# Patient Record
Sex: Male | Born: 1955
Health system: Southern US, Community
[De-identification: ages and names within clinical notes are randomized; demographics above are authoritative.]

## PROBLEM LIST (undated history)

## (undated) DIAGNOSIS — F419 Anxiety disorder, unspecified: Secondary | ICD-10-CM

## (undated) DIAGNOSIS — F329 Major depressive disorder, single episode, unspecified: Secondary | ICD-10-CM

## (undated) DIAGNOSIS — I340 Nonrheumatic mitral (valve) insufficiency: Secondary | ICD-10-CM

## (undated) DIAGNOSIS — G4700 Insomnia, unspecified: Secondary | ICD-10-CM

## (undated) DIAGNOSIS — I428 Other cardiomyopathies: Secondary | ICD-10-CM

## (undated) DIAGNOSIS — I82431 Acute embolism and thrombosis of right popliteal vein: Secondary | ICD-10-CM

## (undated) DIAGNOSIS — I2510 Atherosclerotic heart disease of native coronary artery without angina pectoris: Secondary | ICD-10-CM

## (undated) DIAGNOSIS — J45909 Unspecified asthma, uncomplicated: Secondary | ICD-10-CM

## (undated) DIAGNOSIS — I2699 Other pulmonary embolism without acute cor pulmonale: Secondary | ICD-10-CM

## (undated) DIAGNOSIS — I4729 Other ventricular tachycardia: Secondary | ICD-10-CM

## (undated) DIAGNOSIS — I513 Intracardiac thrombosis, not elsewhere classified: Secondary | ICD-10-CM

## (undated) DIAGNOSIS — I472 Ventricular tachycardia: Secondary | ICD-10-CM

## (undated) DIAGNOSIS — G2581 Restless legs syndrome: Secondary | ICD-10-CM

## (undated) DIAGNOSIS — I5042 Chronic combined systolic (congestive) and diastolic (congestive) heart failure: Secondary | ICD-10-CM

## (undated) DIAGNOSIS — F32A Depression, unspecified: Secondary | ICD-10-CM

## (undated) HISTORY — PX: TONSILLECTOMY: SUR1361

## (undated) HISTORY — PX: MASTOIDECTOMY: SHX711

---

## 2006-09-19 ENCOUNTER — Ambulatory Visit: Payer: Self-pay | Admitting: Internal Medicine

## 2006-09-19 LAB — CONVERTED CEMR LAB: Testosterone, total: 4.2936 ng/mL

## 2006-10-26 ENCOUNTER — Ambulatory Visit: Payer: Self-pay | Admitting: Internal Medicine

## 2007-05-11 DIAGNOSIS — J4540 Moderate persistent asthma, uncomplicated: Secondary | ICD-10-CM | POA: Insufficient documentation

## 2007-05-11 DIAGNOSIS — F329 Major depressive disorder, single episode, unspecified: Secondary | ICD-10-CM | POA: Insufficient documentation

## 2007-06-30 ENCOUNTER — Ambulatory Visit: Payer: Self-pay | Admitting: Internal Medicine

## 2007-06-30 DIAGNOSIS — F411 Generalized anxiety disorder: Secondary | ICD-10-CM | POA: Insufficient documentation

## 2008-02-22 ENCOUNTER — Emergency Department (HOSPITAL_COMMUNITY): Admission: EM | Admit: 2008-02-22 | Discharge: 2008-02-22 | Payer: Self-pay | Admitting: Emergency Medicine

## 2008-04-18 ENCOUNTER — Ambulatory Visit: Payer: Self-pay | Admitting: Internal Medicine

## 2008-04-18 LAB — CONVERTED CEMR LAB
AST: 16 units/L (ref 0–37)
Albumin: 4.9 g/dL (ref 3.5–5.2)
Alkaline Phosphatase: 39 units/L (ref 39–117)
BUN: 15 mg/dL (ref 6–23)
Calcium: 9.5 mg/dL (ref 8.4–10.5)
Chloride: 102 meq/L (ref 96–112)
Glucose, Bld: 90 mg/dL (ref 70–99)
LDL Cholesterol: 122 mg/dL — ABNORMAL HIGH (ref 0–99)
Potassium: 4.5 meq/L (ref 3.5–5.3)
Sodium: 137 meq/L (ref 135–145)
TIBC: 286 ug/dL (ref 215–435)
TSH: 1.64 microintl units/mL (ref 0.350–4.50)
Total Protein: 7.6 g/dL (ref 6.0–8.3)
Triglycerides: 158 mg/dL — ABNORMAL HIGH (ref <150)

## 2008-05-09 ENCOUNTER — Ambulatory Visit: Payer: Self-pay | Admitting: Internal Medicine

## 2008-06-03 ENCOUNTER — Ambulatory Visit: Payer: Self-pay | Admitting: Internal Medicine

## 2008-06-06 ENCOUNTER — Ambulatory Visit: Payer: Self-pay | Admitting: Internal Medicine

## 2008-06-25 ENCOUNTER — Ambulatory Visit: Payer: Self-pay | Admitting: Internal Medicine

## 2008-07-15 ENCOUNTER — Ambulatory Visit: Payer: Self-pay | Admitting: Internal Medicine

## 2008-08-05 ENCOUNTER — Ambulatory Visit: Payer: Self-pay | Admitting: Internal Medicine

## 2008-10-25 ENCOUNTER — Ambulatory Visit: Payer: Self-pay | Admitting: Internal Medicine

## 2011-09-24 ENCOUNTER — Ambulatory Visit: Payer: Managed Care, Other (non HMO)

## 2011-09-24 DIAGNOSIS — R21 Rash and other nonspecific skin eruption: Secondary | ICD-10-CM

## 2011-09-24 DIAGNOSIS — J45909 Unspecified asthma, uncomplicated: Secondary | ICD-10-CM

## 2012-03-07 ENCOUNTER — Encounter: Payer: Managed Care, Other (non HMO) | Admitting: Internal Medicine

## 2012-03-10 NOTE — Progress Notes (Signed)
This encounter was created in error - please disregard.

## 2014-03-14 ENCOUNTER — Ambulatory Visit: Payer: BC Managed Care – PPO | Admitting: Podiatry

## 2014-03-25 ENCOUNTER — Ambulatory Visit (HOSPITAL_BASED_OUTPATIENT_CLINIC_OR_DEPARTMENT_OTHER): Payer: BC Managed Care – PPO | Attending: Internal Medicine | Admitting: Radiology

## 2014-03-25 VITALS — Ht 68.0 in | Wt 168.0 lb

## 2014-03-25 DIAGNOSIS — R0609 Other forms of dyspnea: Secondary | ICD-10-CM | POA: Insufficient documentation

## 2014-03-25 DIAGNOSIS — G4730 Sleep apnea, unspecified: Principal | ICD-10-CM

## 2014-03-25 DIAGNOSIS — G4719 Other hypersomnia: Secondary | ICD-10-CM

## 2014-03-25 DIAGNOSIS — R0989 Other specified symptoms and signs involving the circulatory and respiratory systems: Secondary | ICD-10-CM | POA: Insufficient documentation

## 2014-03-25 DIAGNOSIS — R5381 Other malaise: Secondary | ICD-10-CM

## 2014-03-25 DIAGNOSIS — G4761 Periodic limb movement disorder: Secondary | ICD-10-CM | POA: Insufficient documentation

## 2014-03-25 DIAGNOSIS — R0683 Snoring: Secondary | ICD-10-CM

## 2014-03-25 DIAGNOSIS — I4949 Other premature depolarization: Secondary | ICD-10-CM | POA: Insufficient documentation

## 2014-03-25 DIAGNOSIS — R5383 Other fatigue: Secondary | ICD-10-CM

## 2014-03-25 DIAGNOSIS — G4710 Hypersomnia, unspecified: Secondary | ICD-10-CM | POA: Insufficient documentation

## 2014-03-30 DIAGNOSIS — G4710 Hypersomnia, unspecified: Secondary | ICD-10-CM

## 2014-03-30 NOTE — Sleep Study (Signed)
    NAME: Marcus Bates DATE OF BIRTH:  June 17, 1956 MEDICAL RECORD NUMBER [id number]  LOCATION: [facility]  PHYSICIAN: YOUNG,CLINTON D  DATE OF STUDY: 03/25/2014  SLEEP STUDY TYPE: Multiple Sleep Latency Test               REFERRING PHYSICIAN: Darnelle Bos,*  INDICATION FOR STUDY: Hypersomnia question narcolepsy  EPWORTH SLEEPINESS SCORE:   12/24 HEIGHT:   68 inches WEIGHT:   168 pounds BMI 26 NECK SIZE:   14 inches   MEDICATIONS: Charted for review  NAP 1: 14:30 p.m. sleep latency 2 minutes, REM latency N/A  NAP 2: 16:30 p.m. sleep latency 3.5 minutes, REM latency N/A  NAP 3: 18:30 p.m. sleep latency 1.5 minutes, REM Latency N/A  NAP 4: 20:30 p.m. sleep latency 5 minutes, REM latency N/A  NAP 5: 22:30 p.m. sleep latency 20 minutes, REM latency N/A  MEAN SLEEP LATENCY: 6.4 minutes  NUMBER OF REM EPISODES:  0/5 naps  COMMENTS:  The study was performed following a standard polysomnogram which had been recorded between 6:23 AM and 12:39 PM to accommodate the patient's usual sleep schedule. No medications were taken for either study. The polysomnogram recorded 253.5 minutes sleep, 19.3% REM with REM latency 194.5 minutes. AHI 0.9 per hour with loud snoring, no movement disturbance.  IMPRESSION/ RECOMMENDATION:   1) Abnormal daytime hypersomnia following polysomnogram. Mean sleep latency of 6.4 minutes with sleep on all naps would not be expected in a normal person under these circumstances. This is not specific for narcolepsy given the lack of REM pressure demonstrated during the polysomnogram and the absence of REM on all 5 naps for the study. Depending on clinical information, Idiopathic Hypersomnia would be consistent with these results.   Signed Jetty Duhamel M.D. Waymon Budge Diplomate, American Board of Sleep Medicine  ELECTRONICALLY SIGNED ON:  03/30/2014, 3:22 PM [facility] PH: [phone]   FX: [phone]  [phone] ACCREDITED BY THE AMERICAN ACADEMY OF SLEEP MEDICINE

## 2014-03-30 NOTE — Sleep Study (Signed)
   NAME: Marcus Bates DATE OF BIRTH:  [birth year] MEDICAL RECORD NUMBER [id number]  LOCATION: [facility]  PHYSICIAN: YOUNG,CLINTON D  DATE OF STUDY: 03/25/2014  SLEEP STUDY TYPE:  Polysomnogram- recorded as a daytime study to accommodate the patient's usual sleep schedule.               REFERRING PHYSICIAN: Darnelle Bos,*  INDICATION FOR STUDY: Hypersomnia with sleep apnea  EPWORTH SLEEPINESS SCORE:   12/24 HEIGHT: 5\' 8"  (172.7 cm)  WEIGHT: 76.204 kg (168 lb)    Body mass index is 25.55 kg/(m^2).  NECK SIZE: 14 in.  MEDICATIONS: Charted for review  SLEEP ARCHITECTURE: Total sleep time 253.5 minutes with sleep efficiency 67.3%. Stage I was 13.2%, stage II 67.5%, stage III absent, REM 19.3% of total sleep time. Sleep latency 15 minutes, REM latency 194.5 minutes, awake after sleep onset 109.5 minutes, arousal index 29.6, bedtime medication: None listed  RESPIRATORY DATA: Apnea hypopneas index (AHI) 0.9 per hour. 4 total events scored, all  central apneas while nonsupine. REM AHI 3.7 per hour.  OXYGEN DATA: Loud snoring with oxygen desaturation to a nadir of 90% and mean saturation of 95.6% on room air.  CARDIAC DATA: Sinus rhythm with PVCs  MOVEMENT/PARASOMNIA: A total of 44 limb jerks were counted of which 8 were associated with arousals or awakenings for a periodic limb movement with arousal index of 1.9 per hour. Bathroom x1  IMPRESSION/ RECOMMENDATION:   1) This study was recorded as a daytime study with lights out at 6:23 AM and lights on at 12:39 PM. Sleep architecture was significant for several episodes of wakefulness. Sustained sleep was not maintained until 7:30 AM. He was awake spontaneously 8-8:30 AM and 945-10:30 AM. 2) Occasional respiratory event with sleep disturbance, within normal limits. 4 central apneas were recorded. Snoring was very loud. Oxygen desaturation to a nadir of 90% and mean saturation through the study of 95.6% on room  air. 3) See report of multiple sleep latency test following this study.  Signed Jetty Duhamel M.D. Waymon Budge Diplomate, American Board of Sleep Medicine  ELECTRONICALLY SIGNED ON:  03/30/2014, 3:07 PM [facility] PH: [phone]   FX: [phone] ACCREDITED BY THE AMERICAN ACADEMY OF SLEEP MEDICINE

## 2014-03-31 DIAGNOSIS — G4710 Hypersomnia, unspecified: Secondary | ICD-10-CM

## 2014-04-01 ENCOUNTER — Ambulatory Visit (INDEPENDENT_AMBULATORY_CARE_PROVIDER_SITE_OTHER): Payer: BC Managed Care – PPO | Admitting: Podiatry

## 2014-04-01 ENCOUNTER — Encounter: Payer: Self-pay | Admitting: Podiatry

## 2014-04-01 VITALS — BP 129/87 | HR 93 | Resp 16 | Ht 68.0 in | Wt 168.0 lb

## 2014-04-01 DIAGNOSIS — L600 Ingrowing nail: Secondary | ICD-10-CM

## 2014-04-01 NOTE — Progress Notes (Signed)
Subjective:     Patient ID: Marcus Bates, male   DOB: [birth year], 58 y.o.   MRN: [id number]  HPI patient presents with deformity in the second nails of both feet of approximate two-year duration. Does not remember specific injuries but they were involved in a fungal issue about 2-1/2 years ago   Review of Systems  All other systems reviewed and are negative.      Objective:   Physical Exam  Nursing note and vitals reviewed. Constitutional: He is oriented to person, place, and time.  Cardiovascular: Intact distal pulses.   Musculoskeletal: Normal range of motion.  Neurological: He is oriented to person, place, and time.  Skin: Skin is warm.   neurovascular status intact with muscle strength adequate range of motion subtalar joint within normal limits. Patient's found to have loose second nails of both feet with the right one having fallen off several weeks ago in the left one discolored and ready to fall off at this time. Digits are well-perfused     Assessment:     Probable damage the second nails both feet with very thin nailbeds and inability to hold well    Plan:     Reviewed condition and recommended that either the nails be removed permanently or will allow him to continue to do what they are doing. After thinking he decided to just let him go and see how they do

## 2014-04-01 NOTE — Progress Notes (Signed)
   Subjective:    Patient ID: Marcus Bates, male    DOB: September 29, 1955, 58 y.o.   MRN: [id number]  HPI Comments: "I have a problem with two toes"  Patient c/o discolored, thick, brittle 2nd toenails bilateral for about 2 years. He states that they go through a cycle where they get blood underneath the toenail and they turn dark and fall off. He has been using OTC meds-no help.     Review of Systems  Constitutional: Positive for fatigue.  HENT: Positive for hearing loss.   Eyes: Positive for visual disturbance.  Respiratory: Positive for shortness of breath.   Genitourinary: Positive for frequency.  Musculoskeletal: Positive for arthralgias.  Allergic/Immunologic: Positive for environmental allergies.  All other systems reviewed and are negative.      Objective:   Physical Exam        Assessment & Plan:

## 2014-08-12 ENCOUNTER — Ambulatory Visit (INDEPENDENT_AMBULATORY_CARE_PROVIDER_SITE_OTHER): Payer: BC Managed Care – PPO | Admitting: Internal Medicine

## 2014-08-12 ENCOUNTER — Encounter: Payer: Self-pay | Admitting: Internal Medicine

## 2014-08-12 VITALS — BP 138/72 | HR 95 | Ht 68.0 in | Wt 169.0 lb

## 2014-08-12 DIAGNOSIS — G4720 Circadian rhythm sleep disorder, unspecified type: Secondary | ICD-10-CM

## 2014-08-12 MED ORDER — CLONAZEPAM 0.5 MG PO TABS: ORAL_TABLET | ORAL | Status: DC

## 2014-08-12 MED ORDER — METHYLPHENIDATE HCL ER 20 MG PO TBCR
20.0000 mg | EXTENDED_RELEASE_TABLET | Freq: Every day | ORAL | Status: DC
Start: 2014-08-12 — End: 2015-05-16

## 2014-08-12 NOTE — Progress Notes (Signed)
08/12/14- 58 yoM never smoker self referred for second sleep medicine opinion.  He has been working with Dr Earl Gala. Mainly complains of not sleeping well and excessive sleepiness. 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 Usual bedtime 6AM, up at 1:30 PM. Long sleep latency, but says usual concern is waking during sleep and carry-over tired/ groggy. Aware of leg jerks and has tried Requip and Mirapex, but not clear jerks or restless legs are important.  Has dark, quiet bedroom Presents long list of previous prescription and nonprescription meds and herbal products tried. Recently using gabapentin to suppress leg jerks and promote sleep. It works well but adds to residual sleepiness. Clonazepam up to 2 mg did not work as well by itself, but worked better combined w gabapentin. Some snore. Admits mild depression. Denies ETOH, street drugs. Denies cardiac disease or neurologic problem. NPSG 04/14/98-AHI 0/ hr, Periodic limb movement, mild snoring PSG 03/25/14- Daytime study- AHI 0.9/ hr, mild limb jerks, fragmented sleep with frequent awakening, total sleep time 253.5 minutes, 19.3% REM. MSLT 03/26/14- Done after daytime PSG, mean latency 6.4 min, no SOREM/ 5 naps. Pattern consistent with nonspecific hypersomnia with studies timed to be similar to his usual sleep-wake pattern.  Prior to Admission medications   Medication Sig Start Date End Date Taking? Authorizing Provider  BUPROPION HCL PO Take by mouth.   Yes Historical Provider, MD  Fluticasone-Salmeterol (ADVAIR) 100-50 MCG/DOSE AEPB Inhale 1 puff into the lungs every 12 (twelve) hours.   Yes Historical Provider, MD  gabapentin (NEURONTIN) 600 MG tablet Take 300 mg by mouth at bedtime.    Yes Historical Provider, MD  clonazePAM (KLONOPIN) 0.5 MG tablet 1 for sleep as directed 08/12/14   Waymon Budge, MD  methylphenidate (METADATE ER) 20 MG ER tablet Take 1 tablet (20 mg total) by mouth daily. 08/12/14   Waymon Budge,  MD   No past medical history on file. No past surgical history on file. No family history on file. History   Social History  . Marital Status: Single    Spouse Name: N/A    Number of Children: N/A  . Years of Education: N/A   Occupational History  . Not on file.   Social History Main Topics  . Smoking status: Never Smoker   . Smokeless tobacco: Never Used  . Alcohol Use: Not on file  . Drug Use: Not on file  . Sexual Activity: Not on file   Other Topics Concern  . Not on file   Social History Narrative   ROS-see HPI Constitutional:   No-   weight loss, night sweats, fevers, chills, fatigue, lassitude. HEENT:   No-  headaches, difficulty swallowing, tooth/dental problems, sore throat,       No-  sneezing, itching, ear ache, nasal congestion, post nasal drip,  CV:  No-   chest pain, orthopnea, PND, swelling in lower extremities, anasarca,                                  dizziness, palpitations Resp: No-   shortness of breath with exertion or at rest.              No-   productive cough,  No non-productive cough,  No- coughing up of blood.              No-   change in color of mucus.  No- wheezing.  Skin: No-   rash or lesions. GI:  No-   heartburn, indigestion, abdominal pain, nausea, vomiting, diarrhea,                 change in bowel habits, loss of appetite GU: No-   dysuria, change in color of urine, no urgency or frequency.  No- flank pain. MS:  No-   joint pain or swelling.  No- decreased range of motion.  No- back pain. Neuro-     nothing unusual Psych:  No- change in mood or affect. No depression or anxiety.  No memory loss.  OBJ- Physical Exam General- Alert, Oriented, Affect-appropriate, Distress- none acute Skin- rash-none, lesions- none, excoriation- none Lymphadenopathy- none Head- atraumatic            Eyes- Gross vision intact, PERRLA, conjunctivae and secretions clear            Ears- Hearing, canals-normal            Nose- Clear, no-Septal dev,  mucus, polyps, erosion, perforation             Throat- Mallampati II , mucosa clear , drainage- none, tonsils- atrophic Neck- flexible , trachea midline, no stridor , thyroid nl, carotid no bruit Chest - symmetrical excursion , unlabored           Heart/CV- RRR , no murmur , no gallop  , no rub, nl s1 s2                           - JVD- none , edema- none, stasis changes- none, varices- none           Lung- clear to P&A, wheeze- none, cough- none , dullness-none, rub- none           Chest wall-  Abd- tender-no, distended-no, bowel sounds-present, HSM- no Br/ Gen/ Rectal- Not done, not indicated Extrem- cyanosis- none, clubbing, none, atrophy- none, strength- nl Neuro- grossly intact to observation

## 2014-08-12 NOTE — Patient Instructions (Addendum)
Script for methylphenidate ER 20 mg (Ritalin) to take on first waking. See if this helps with daytime drowsiness without over-stimulating you.   Script for clonazepam(Klonopin) 0.5 mg. Try combining this with your gabapentin 300 mg and take them both earlier- perhaps 2:30 AM, so they wear off with less drowsiness when you want to be awake. You can split either or both to take lower doses if that works better.   We don't want you being treated by both Dr Earl Gala and myself for the same problem. Since you started with him, my suggestion would be that you try these meds as discussed today, then follow up on your experience with Dr Earl Gala going forward. I will send him a copy of my note.  Cognitive Behavioral Therapy may be a very good alternative to lots of medication.

## 2014-08-25 DIAGNOSIS — G4720 Circadian rhythm sleep disorder, unspecified type: Secondary | ICD-10-CM | POA: Insufficient documentation

## 2014-08-25 NOTE — Assessment & Plan Note (Signed)
Describes gabapentin treated for ? Hypnic jerks. This doesn't sound like periodic limb movement, restless legs or another diagnosable movement disorder. Suspect there may be a component of depression. Sleep studies do not demonstrate today diagnosable sleep disordered breathing pattern. There may be a component of chronic insomnia. He was quick to present a long list of sleep medicines and natural remedies which he has tried. He did best with clonazepam 0.5 mg plus gabapentin 300 mg. We discussed a limited trial of methylphenidate ER to help anchor and earlier daytime waking. Cognitive behavioral therapy may be useful for him.

## 2014-11-26 ENCOUNTER — Other Ambulatory Visit: Payer: Self-pay | Admitting: Internal Medicine

## 2014-11-28 ENCOUNTER — Other Ambulatory Visit: Payer: Self-pay | Admitting: *Deleted

## 2014-11-28 NOTE — Telephone Encounter (Signed)
Faxed 11/28/14

## 2014-11-28 NOTE — Telephone Encounter (Signed)
Rx printed

## 2015-03-29 ENCOUNTER — Emergency Department (INDEPENDENT_AMBULATORY_CARE_PROVIDER_SITE_OTHER)
Admission: EM | Admit: 2015-03-29 | Discharge: 2015-03-29 | Disposition: A | Discharge status: Home / Self Care | Payer: BLUE CROSS/BLUE SHIELD | Source: Home / Self Care

## 2015-03-29 ENCOUNTER — Encounter (HOSPITAL_COMMUNITY): Payer: Self-pay | Admitting: Emergency Medicine

## 2015-03-29 DIAGNOSIS — J45901 Unspecified asthma with (acute) exacerbation: Secondary | ICD-10-CM

## 2015-03-29 DIAGNOSIS — L739 Follicular disorder, unspecified: Secondary | ICD-10-CM

## 2015-03-29 HISTORY — DX: Insomnia, unspecified: G47.00

## 2015-03-29 HISTORY — DX: Anxiety disorder, unspecified: F41.9

## 2015-03-29 HISTORY — DX: Unspecified asthma, uncomplicated: J45.909

## 2015-03-29 HISTORY — DX: Depression, unspecified: F32.A

## 2015-03-29 HISTORY — DX: Major depressive disorder, single episode, unspecified: F32.9

## 2015-03-29 MED ORDER — MUPIROCIN 2 % EX OINT: 1.0000 "application " | TOPICAL_OINTMENT | Freq: Two times a day (BID) | CUTANEOUS | Status: DC

## 2015-03-29 MED ORDER — IPRATROPIUM-ALBUTEROL 0.5-2.5 (3) MG/3ML IN SOLN
RESPIRATORY_TRACT | Status: AC
  Filled 2015-03-29: qty 3

## 2015-03-29 MED ORDER — METHYLPREDNISOLONE SODIUM SUCC 125 MG IJ SOLR
INTRAMUSCULAR | Status: AC
  Filled 2015-03-29: qty 2

## 2015-03-29 MED ORDER — METHYLPREDNISOLONE SODIUM SUCC 125 MG IJ SOLR
62.5000 mg | Freq: Once | INTRAMUSCULAR | Status: AC
  Administered 2015-03-29: 62.5 mg via INTRAMUSCULAR

## 2015-03-29 MED ORDER — IPRATROPIUM-ALBUTEROL 0.5-2.5 (3) MG/3ML IN SOLN
3.0000 mL | Freq: Once | RESPIRATORY_TRACT | Status: AC
  Administered 2015-03-29: 3 mL via RESPIRATORY_TRACT

## 2015-03-29 MED ORDER — ALBUTEROL SULFATE HFA 108 (90 BASE) MCG/ACT IN AERS: 2.0000 | INHALATION_SPRAY | RESPIRATORY_TRACT | Status: AC | PRN

## 2015-03-29 MED ORDER — PREDNISONE 50 MG PO TABS: ORAL_TABLET | ORAL | Status: DC

## 2015-03-29 NOTE — ED Notes (Signed)
C/o asthma States he has chest tightness States he is sob with execration advair used as tx

## 2015-03-29 NOTE — ED Provider Notes (Addendum)
CSN: [id number]     Arrival date & time 03/29/15  1745 History   None    Chief Complaint  Patient presents with  . Asthma   (Consider location/radiation/quality/duration/timing/severity/associated sxs/prior Treatment) HPI  Shortness of breath. Started several days ago. Associated with intermittent wheezing, chest tightness, occasional diaphoresis. Patient routinely takes Advair at night which typically helps but is not help recently. Symptoms significantly worse with exertion. States that the chest tightness or fullness that he has had in the midsternal region is different than his normal chest tightness which is associated with his asthma. Denies any palpitations, radiation to the neck or shoulder, fevers, vomiting, diarrhea, constipation, abdominal pain. Occasional nausea. Patient currently is without chest tightness but states that this comes on fairly easily as he exerts himself.  She had a swollen right lower extremity back in January which spontaneously resolved as it no further limb swelling since that point time.  Head source. Ongoing for months. Washes the area daily without benefit. Continues to progress.  Past Medical History  Diagnosis Date  . Asthma   . Anxiety   . Depression   . Insomnia    History reviewed. No pertinent past surgical history. Family History  Problem Relation Age of Onset  . Heart disease Other    History  Substance Use Topics  . Smoking status: Never Smoker   . Smokeless tobacco: Never Used  . Alcohol Use: Not on file    Review of Systems Per HPI with all other pertinent systems negative.    Allergies  Aspirin; Dust mite extract; Other; and Peanuts  Home Medications   Prior to Admission medications   Medication Sig Start Date End Date Taking? Authorizing Provider  Amphetamine Sulfate 5 MG TABS Take by mouth.   Yes Historical Provider, MD  BUPROPION HCL PO Take by mouth.   Yes Historical Provider, MD  clonazePAM (KLONOPIN) 0.5 MG tablet  TAKE 1 TABLET BY MOUTH EVERY EVENING AS DIRECTED 11/28/14  Yes Waymon Budge, MD  Fluticasone-Salmeterol (ADVAIR) 100-50 MCG/DOSE AEPB Inhale 1 puff into the lungs every 12 (twelve) hours.   Yes Historical Provider, MD  gabapentin (NEURONTIN) 600 MG tablet Take 300 mg by mouth at bedtime.    Yes Historical Provider, MD  methylphenidate (METADATE ER) 20 MG ER tablet Take 1 tablet (20 mg total) by mouth daily. 08/12/14  Yes Waymon Budge, MD  albuterol (PROVENTIL HFA;VENTOLIN HFA) 108 (90 BASE) MCG/ACT inhaler Inhale 2 puffs into the lungs every 4 (four) hours as needed for wheezing or shortness of breath. 03/29/15   Ozella Rocks, MD  mupirocin ointment (BACTROBAN) 2 % Apply 1 application topically 2 (two) times daily. 03/29/15   Ozella Rocks, MD  predniSONE (DELTASONE) 50 MG tablet Take daily with breakfast 03/29/15   Ozella Rocks, MD   BP 123/86 mmHg  Pulse 108  Temp(Src) 97.1 F (36.2 C) (Oral)  Resp 16  SpO2 96% Physical Exam Physical Exam  Constitutional: oriented to person, place, and time. appears well-developed and well-nourished. No distress.  HENT:  Head: Normocephalic and atraumatic.  Eyes: EOMI. PERRL.  Neck: Normal range of motion.  Cardiovascular: RRR, no m/r/g, 2+ distal pulses,  Pulmonary/Chest: Increased work of breathing. Decreased breath sounds. No appreciable wheezing rhonchi crackles..  Abdominal: Soft. Bowel sounds are normal. NonTTP, no distension.  Musculoskeletal: Normal range of motion. Non ttp, no effusion.  Neurological: alert and oriented to person, place, and time.  Skin: Right occipital scalp with follicular lesions.  Psychiatric:  normal mood and affect. behavior is normal. Judgment and thought content normal.   ED Course  Procedures (including critical care time) Labs Review Labs Reviewed - No data to display  Imaging Review No results found.   MDM   1. Acute asthma flare, unspecified asthma severity   2. Folliculitis    folliculitis on  treatment with mupirocin given.  EKG with PVCs.  Patient given a DuoNeb and Solu-Medrol 62.5 mg IM in clinic with significant improvement in his shortness of breath. Discussed other possibilities of patient's symptoms including PE, MI with patient. Due to patient's marked improvement after breathing treatments will treat appreciable patient as an asthma flare with continued albuterol every 4 hours and prednisone. Patient given very strict return precautions and will go to the emergency room if he does not improve or gets worse for cardiac and PE workup. Patient agrees with this plan.    Ozella Rocks, MD 03/29/15 Serena Croissant  Ozella Rocks, MD 03/29/15 [phone]

## 2015-03-29 NOTE — Discharge Instructions (Signed)
Cause of your symptoms is likely from an asthma flare. Please use the albuterol inhaler every 4 hours for the next 24-48 hours then as needed. Please take the steroids every morning. Please use the mupirocin ointment on the sores on her head. Please go to the emergency room if her symptoms become worse or do not resolve.

## 2015-04-30 ENCOUNTER — Encounter (HOSPITAL_COMMUNITY): Payer: Self-pay | Admitting: *Deleted

## 2015-04-30 ENCOUNTER — Inpatient Hospital Stay (HOSPITAL_COMMUNITY)
Admission: EM | Admit: 2015-04-30 | Discharge: 2015-05-16 | DRG: 286 | Disposition: A | Discharge status: Home Health | Payer: BLUE CROSS/BLUE SHIELD | Attending: Internal Medicine | Admitting: Internal Medicine

## 2015-04-30 ENCOUNTER — Emergency Department (HOSPITAL_COMMUNITY): Payer: BLUE CROSS/BLUE SHIELD

## 2015-04-30 DIAGNOSIS — T50905A Adverse effect of unspecified drugs, medicaments and biological substances, initial encounter: Secondary | ICD-10-CM | POA: Diagnosis not present

## 2015-04-30 DIAGNOSIS — G4710 Hypersomnia, unspecified: Secondary | ICD-10-CM | POA: Diagnosis present

## 2015-04-30 DIAGNOSIS — F419 Anxiety disorder, unspecified: Secondary | ICD-10-CM | POA: Diagnosis present

## 2015-04-30 DIAGNOSIS — I82431 Acute embolism and thrombosis of right popliteal vein: Secondary | ICD-10-CM | POA: Diagnosis present

## 2015-04-30 DIAGNOSIS — I2699 Other pulmonary embolism without acute cor pulmonale: Secondary | ICD-10-CM | POA: Diagnosis present

## 2015-04-30 DIAGNOSIS — I513 Intracardiac thrombosis, not elsewhere classified: Secondary | ICD-10-CM | POA: Diagnosis present

## 2015-04-30 DIAGNOSIS — I420 Dilated cardiomyopathy: Secondary | ICD-10-CM | POA: Diagnosis present

## 2015-04-30 DIAGNOSIS — R740 Nonspecific elevation of levels of transaminase and lactic acid dehydrogenase [LDH]: Secondary | ICD-10-CM | POA: Diagnosis present

## 2015-04-30 DIAGNOSIS — Z79899 Other long term (current) drug therapy: Secondary | ICD-10-CM | POA: Diagnosis not present

## 2015-04-30 DIAGNOSIS — J9601 Acute respiratory failure with hypoxia: Secondary | ICD-10-CM | POA: Diagnosis present

## 2015-04-30 DIAGNOSIS — R0601 Orthopnea: Secondary | ICD-10-CM

## 2015-04-30 DIAGNOSIS — I240 Acute coronary thrombosis not resulting in myocardial infarction: Secondary | ICD-10-CM | POA: Diagnosis present

## 2015-04-30 DIAGNOSIS — Z8249 Family history of ischemic heart disease and other diseases of the circulatory system: Secondary | ICD-10-CM | POA: Diagnosis not present

## 2015-04-30 DIAGNOSIS — Z886 Allergy status to analgesic agent status: Secondary | ICD-10-CM | POA: Diagnosis not present

## 2015-04-30 DIAGNOSIS — I471 Supraventricular tachycardia: Secondary | ICD-10-CM | POA: Diagnosis present

## 2015-04-30 DIAGNOSIS — I493 Ventricular premature depolarization: Secondary | ICD-10-CM | POA: Diagnosis present

## 2015-04-30 DIAGNOSIS — Z91010 Allergy to peanuts: Secondary | ICD-10-CM

## 2015-04-30 DIAGNOSIS — I5043 Acute on chronic combined systolic (congestive) and diastolic (congestive) heart failure: Principal | ICD-10-CM | POA: Diagnosis present

## 2015-04-30 DIAGNOSIS — N179 Acute kidney failure, unspecified: Secondary | ICD-10-CM | POA: Diagnosis present

## 2015-04-30 DIAGNOSIS — I428 Other cardiomyopathies: Secondary | ICD-10-CM

## 2015-04-30 DIAGNOSIS — I9589 Other hypotension: Secondary | ICD-10-CM | POA: Diagnosis not present

## 2015-04-30 DIAGNOSIS — I472 Ventricular tachycardia: Secondary | ICD-10-CM | POA: Diagnosis present

## 2015-04-30 DIAGNOSIS — I340 Nonrheumatic mitral (valve) insufficiency: Secondary | ICD-10-CM | POA: Diagnosis present

## 2015-04-30 DIAGNOSIS — R570 Cardiogenic shock: Secondary | ICD-10-CM | POA: Diagnosis not present

## 2015-04-30 DIAGNOSIS — I509 Heart failure, unspecified: Secondary | ICD-10-CM

## 2015-04-30 DIAGNOSIS — I2510 Atherosclerotic heart disease of native coronary artery without angina pectoris: Secondary | ICD-10-CM | POA: Diagnosis present

## 2015-04-30 DIAGNOSIS — I959 Hypotension, unspecified: Secondary | ICD-10-CM | POA: Diagnosis not present

## 2015-04-30 DIAGNOSIS — R945 Abnormal results of liver function studies: Secondary | ICD-10-CM | POA: Diagnosis present

## 2015-04-30 DIAGNOSIS — N289 Disorder of kidney and ureter, unspecified: Secondary | ICD-10-CM

## 2015-04-30 DIAGNOSIS — K761 Chronic passive congestion of liver: Secondary | ICD-10-CM | POA: Diagnosis present

## 2015-04-30 DIAGNOSIS — I429 Cardiomyopathy, unspecified: Secondary | ICD-10-CM | POA: Diagnosis not present

## 2015-04-30 DIAGNOSIS — I5041 Acute combined systolic (congestive) and diastolic (congestive) heart failure: Secondary | ICD-10-CM

## 2015-04-30 DIAGNOSIS — I4729 Other ventricular tachycardia: Secondary | ICD-10-CM

## 2015-04-30 DIAGNOSIS — R0602 Shortness of breath: Secondary | ICD-10-CM | POA: Diagnosis present

## 2015-04-30 DIAGNOSIS — L270 Generalized skin eruption due to drugs and medicaments taken internally: Secondary | ICD-10-CM | POA: Diagnosis not present

## 2015-04-30 DIAGNOSIS — Z9109 Other allergy status, other than to drugs and biological substances: Secondary | ICD-10-CM

## 2015-04-30 DIAGNOSIS — R7989 Other specified abnormal findings of blood chemistry: Secondary | ICD-10-CM | POA: Diagnosis not present

## 2015-04-30 DIAGNOSIS — I213 ST elevation (STEMI) myocardial infarction of unspecified site: Secondary | ICD-10-CM | POA: Diagnosis not present

## 2015-04-30 DIAGNOSIS — J4540 Moderate persistent asthma, uncomplicated: Secondary | ICD-10-CM | POA: Diagnosis present

## 2015-04-30 DIAGNOSIS — I517 Cardiomegaly: Secondary | ICD-10-CM | POA: Diagnosis not present

## 2015-04-30 DIAGNOSIS — I5021 Acute systolic (congestive) heart failure: Secondary | ICD-10-CM | POA: Diagnosis not present

## 2015-04-30 DIAGNOSIS — J4520 Mild intermittent asthma, uncomplicated: Secondary | ICD-10-CM | POA: Diagnosis not present

## 2015-04-30 DIAGNOSIS — R0600 Dyspnea, unspecified: Secondary | ICD-10-CM

## 2015-04-30 HISTORY — DX: Ventricular tachycardia: I47.2

## 2015-04-30 HISTORY — DX: Nonrheumatic mitral (valve) insufficiency: I34.0

## 2015-04-30 HISTORY — DX: Other ventricular tachycardia: I47.29

## 2015-04-30 HISTORY — DX: Other cardiomyopathies: I42.8

## 2015-04-30 HISTORY — DX: Other pulmonary embolism without acute cor pulmonale: I26.99

## 2015-04-30 HISTORY — DX: Intracardiac thrombosis, not elsewhere classified: I51.3

## 2015-04-30 HISTORY — DX: Restless legs syndrome: G25.81

## 2015-04-30 HISTORY — DX: Acute embolism and thrombosis of right popliteal vein: I82.431

## 2015-04-30 HISTORY — DX: Atherosclerotic heart disease of native coronary artery without angina pectoris: I25.10

## 2015-04-30 HISTORY — DX: Chronic combined systolic (congestive) and diastolic (congestive) heart failure: I50.42

## 2015-04-30 LAB — URINE MICROSCOPIC-ADD ON

## 2015-04-30 LAB — COMPREHENSIVE METABOLIC PANEL
ALK PHOS: 100 U/L (ref 38–126)
ALT: 272 U/L — ABNORMAL HIGH (ref 17–63)
AST: 120 U/L — ABNORMAL HIGH (ref 15–41)
Albumin: 4 g/dL (ref 3.5–5.0)
Anion gap: 12 (ref 5–15)
BILIRUBIN TOTAL: 1.8 mg/dL — AB (ref 0.3–1.2)
BUN: 19 mg/dL (ref 6–20)
CO2: 24 mmol/L (ref 22–32)
CREATININE: 1.27 mg/dL — AB (ref 0.61–1.24)
Calcium: 9.1 mg/dL (ref 8.9–10.3)
Chloride: 100 mmol/L — ABNORMAL LOW (ref 101–111)
GFR calc non Af Amer: >60 mL/min (ref >60)
Glucose, Bld: 95 mg/dL (ref 65–99)
Potassium: 4.9 mmol/L (ref 3.5–5.1)
SODIUM: 136 mmol/L (ref 135–145)
TOTAL PROTEIN: 6.5 g/dL (ref 6.5–8.1)

## 2015-04-30 LAB — URINALYSIS, ROUTINE W REFLEX MICROSCOPIC
Glucose, UA: NEGATIVE mg/dL
Hgb urine dipstick: NEGATIVE
KETONES UR: NEGATIVE mg/dL
Nitrite: NEGATIVE
Protein, ur: NEGATIVE mg/dL
SPECIFIC GRAVITY, URINE: 1.028 (ref 1.005–1.030)
Urobilinogen, UA: 0.2 mg/dL (ref 0.0–1.0)
pH: 5.5 (ref 5.0–8.0)

## 2015-04-30 LAB — CBC
HEMATOCRIT: 48.5 % (ref 39.0–52.0)
HEMOGLOBIN: 16.1 g/dL (ref 13.0–17.0)
MCH: 30.8 pg (ref 26.0–34.0)
MCHC: 33.2 g/dL (ref 30.0–36.0)
MCV: 92.7 fL (ref 78.0–100.0)
Platelets: 200 10*3/uL (ref 150–400)
RBC: 5.23 MIL/uL (ref 4.22–5.81)
RDW: 15.4 % (ref 11.5–15.5)
WBC: 8.1 10*3/uL (ref 4.0–10.5)

## 2015-04-30 LAB — PROTIME-INR
INR: 1.34 (ref 0.00–1.49)
Prothrombin Time: 16.7 seconds — ABNORMAL HIGH (ref 11.6–15.2)

## 2015-04-30 LAB — LIPASE, BLOOD: Lipase: 23 U/L (ref 22–51)

## 2015-04-30 LAB — APTT: aPTT: 30 seconds (ref 24–37)

## 2015-04-30 MED ORDER — HEPARIN BOLUS VIA INFUSION
4000.0000 [IU] | Freq: Once | INTRAVENOUS | Status: AC
  Administered 2015-05-01: 4000 [IU] via INTRAVENOUS
  Filled 2015-04-30: qty 4000

## 2015-04-30 MED ORDER — ONDANSETRON HCL 4 MG/2ML IJ SOLN
4.0000 mg | Freq: Once | INTRAMUSCULAR | Status: AC
  Administered 2015-04-30: 4 mg via INTRAVENOUS
  Filled 2015-04-30: qty 2

## 2015-04-30 MED ORDER — HEPARIN (PORCINE) IN NACL 100-0.45 UNIT/ML-% IJ SOLN
1400.0000 [IU]/h | INTRAMUSCULAR | Status: DC
  Administered 2015-05-01 (×2): 1200 [IU]/h via INTRAVENOUS
  Administered 2015-05-02: 1450 [IU]/h via INTRAVENOUS
  Filled 2015-04-30 (×4): qty 250

## 2015-04-30 MED ORDER — IOHEXOL 300 MG/ML  SOLN: 100.0000 mL | Freq: Once | INTRAMUSCULAR | Status: DC | PRN

## 2015-04-30 MED ORDER — PREDNISONE 20 MG PO TABS: 60.0000 mg | ORAL_TABLET | Freq: Once | ORAL | Status: DC

## 2015-04-30 NOTE — ED Provider Notes (Signed)
Patient states he has trouble drinking liquids for approximately the past 2 weeks. He does not have any trouble swallowing solids. He denies abdominal pain denies chest pain. No other associated symptoms. On exam no distress. Neck positive JVD lungs clear auscultation abdomen nondistended nontender. Questionable hepatomegaly. Bilateral lower extremities with 1+ pretibial pitting edema  Doug Sou, MD 05/01/15 [id number]

## 2015-04-30 NOTE — ED Provider Notes (Signed)
CSN: [id number]     Arrival date & time 04/30/15  1602 History   First MD Initiated Contact with Patient 04/30/15 2044     Chief Complaint  Patient presents with  . Abnormal Labs    HPI   59 year old male presents tonight with nausea, vomiting, and abnormal labs. Patient reports for the last 10 days he's had nausea, over the last several days has had vomiting with by mouth intake. Patient reports he feels dehydrated as he has been unable to tolerate oral intake. Patient reports he saw his primary care provider who drew labs on him which resulted in elevated AST ALT and bilirubin. Patient denies any abdominal pain, chest pain, orthopnea, changes in his urine color clarity or characteristics, normal bowel movements normal color. Patient denies any history of pancreatic, gallbladder, liver disease. Patient reports approximately an 8 pound weight loss over the last year, denies fever, night sweats. Patient reports his left leg is edematous, and reports this is been present for approximately one week. He also notes some swelling to the right leg. History of the same with greater on the right that resolved spontaneously. Patient reports that over the last several months he's had dyspnea on exertion, that has been increasing.    Past Medical History  Diagnosis Date  . Asthma   . Anxiety   . Depression   . Insomnia    History reviewed. No pertinent past surgical history. Family History  Problem Relation Age of Onset  . Heart disease Other    Social History  Substance Use Topics  . Smoking status: Never Smoker   . Smokeless tobacco: Never Used  . Alcohol Use: None    Review of Systems  All other systems reviewed and are negative.   Allergies  Aspirin; Dust mite extract; Other; and Peanuts  Home Medications   Prior to Admission medications   Medication Sig Start Date End Date Taking? Authorizing Provider  acetaminophen (TYLENOL) 500 MG tablet Take 500 mg by mouth every 6 (six) hours  as needed (calm nerves).   Yes Historical Provider, MD  ADVAIR DISKUS 250-50 MCG/DOSE AEPB TAKE 1 PUFF BY MOUTH TWICE A DAY 04/16/15  Yes Historical Provider, MD  albuterol (PROVENTIL HFA;VENTOLIN HFA) 108 (90 BASE) MCG/ACT inhaler Inhale 2 puffs into the lungs every 4 (four) hours as needed for wheezing or shortness of breath. 03/29/15  Yes Ozella Rocks, MD  amphetamine-dextroamphetamine (ADDERALL) 10 MG tablet TAKE 1 TABLET IN THE MORNING TWICE A DAY 03/07/15  Yes Historical Provider, MD  clonazePAM (KLONOPIN) 0.5 MG tablet TAKE 1 TABLET BY MOUTH EVERY EVENING AS DIRECTED Patient taking differently: TAKE 1 TABLET BY MOUTH EVERY EVENING AS NEEDED FOR ANXIETY 11/28/14  Yes Waymon Budge, MD  gabapentin (NEURONTIN) 600 MG tablet Take 300 mg by mouth at bedtime.    Yes Historical Provider, MD  methylphenidate (METADATE ER) 20 MG ER tablet Take 1 tablet (20 mg total) by mouth daily. Patient not taking: Reported on 04/30/2015 08/12/14   Waymon Budge, MD  mupirocin ointment (BACTROBAN) 2 % Apply 1 application topically 2 (two) times daily. Patient not taking: Reported on 04/30/2015 03/29/15   Ozella Rocks, MD  predniSONE (DELTASONE) 50 MG tablet Take daily with breakfast Patient not taking: Reported on 04/30/2015 03/29/15   Ozella Rocks, MD   BP 120/90 mmHg  Pulse 48  Temp(Src) 97.5 F (36.4 C) (Oral)  Resp 18  SpO2 98%   Physical Exam  Constitutional: He is oriented to  person, place, and time. He appears well-developed and well-nourished.  HENT:  Head: Normocephalic and atraumatic.  Eyes: Conjunctivae are normal. Pupils are equal, round, and reactive to light. Right eye exhibits no discharge. Left eye exhibits no discharge. No scleral icterus.  Neck: Normal range of motion. JVD present. No tracheal deviation present.  Cardiovascular: Regular rhythm.   Murmur heard. Pulmonary/Chest: Effort normal and breath sounds normal. No stridor. No respiratory distress. He has no wheezes. He has no  rales. He exhibits no tenderness.  Abdominal: Soft. Bowel sounds are normal. There is hepatomegaly. There is no hepatosplenomegaly. There is no rigidity, no guarding, no CVA tenderness, no tenderness at McBurney's point and negative Murphy's sign.  Musculoskeletal: Normal range of motion.  Pitting edema to the left lower extremity  Neurological: He is alert and oriented to person, place, and time. Coordination normal.  Skin: Skin is warm and dry. No rash noted. No erythema. No pallor.  Psychiatric: He has a normal mood and affect. His behavior is normal. Judgment and thought content normal.  Nursing note and vitals reviewed.   ED Course  Procedures (including critical care time) Labs Review Labs Reviewed  COMPREHENSIVE METABOLIC PANEL - Abnormal; Notable for the following:    Chloride 100 (*)    Creatinine, Ser 1.27 (*)    AST 120 (*)    ALT 272 (*)    Total Bilirubin 1.8 (*)    All other components within normal limits  URINALYSIS, ROUTINE W REFLEX MICROSCOPIC (NOT AT [facility]) - Abnormal; Notable for the following:    Color, Urine AMBER (*)    APPearance CLOUDY (*)    Bilirubin Urine SMALL (*)    Leukocytes, UA TRACE (*)    All other components within normal limits  CBC  LIPASE, BLOOD  URINE MICROSCOPIC-ADD ON    Imaging Review Ct Abdomen Pelvis W Contrast  04/30/2015   CLINICAL DATA:  Abnormal labs. Elevated AST and ALT. Right upper quadrant mass. Nausea and vomiting for several weeks.  EXAM: CT ABDOMEN AND PELVIS WITH CONTRAST  TECHNIQUE: Multidetector CT imaging of the abdomen and pelvis was performed using the standard protocol following bolus administration of intravenous contrast.  CONTRAST:  100 mL Omnipaque 300 IV  COMPARISON:  None.  FINDINGS: Lower chest: Multi chamber cardiomegaly. Small right effusion. Adjacent ground-glass opacities in the right lower lobe. There is an apparent filling defect within the right lower lobe pulmonary artery. Minimal linear atelectasis in the  lingula.  Liver: Contrast refluxing into the IVC and hepatic veins. Liver appears normal in size. No evidence of focal hepatic lesion. No definite morphologic findings of cirrhosis. There is a small amount perihepatic ascites.  Hepatobiliary: Gallbladder physiologically distended, questionable gallbladder wall thickening. No calcified gallstone. No biliary dilatation, common bile duct measures 6 mm at the porta hepatis.  Pancreas: Fatty atrophy of the head and uncinate process. No focal lesion. No ductal dilatation.  Spleen: Normal in size without focal lesion.  Adrenal glands: No nodule.  Kidneys: Symmetric renal enhancement. No hydronephrosis. No focal lesion.  Stomach/Bowel: Stomach physiologically distended. There are no dilated or thickened small bowel loops. Minimal thickening involving the ascending colon. Small volume of colonic stool. Distal diverticulosis without diverticulitis. The appendix is normal.  Vascular/Lymphatic: No retroperitoneal adenopathy. Abdominal aorta is normal in caliber. Moderate atherosclerosis of the abdominal aorta and its branches. No mesenteric or pelvic adenopathy.  Reproductive: Prostate gland is normal in size.  Bladder: Physiologically distended. Equivocal bladder wall thickening.  Other: Small amount of  intra-abdominal ascites primarily in the perihepatic space, tracking in the right pericolic gutter and in the pelvis. No free air or loculated intra-abdominal fluid collection. Small fat containing umbilical hernia. There is mild whole body wall and mesenteric edema.  Musculoskeletal: There are no acute or suspicious osseous abnormalities. Degenerative change in the lumbar spine and at the pubic symphysis.  IMPRESSION: 1. No evidence of focal right upper quadrant or intra-abdominal mass. 2. Small right pleural effusion, small amount of intra-abdominal and pelvic ascites, and whole body wall edema. Findings may be related to liver dysfunction, however there are no CT morphologic  findings of cirrhosis. There is multi chamber cardiomegaly, and right heart failure could produce a similar appearance. Suggestion of gallbladder wall thickening is likely related to systemic process. No biliary obstruction. 3. Incidental note of pulmonary embolus in a segmental right lower lobe pulmonary artery. 4. Colonic diverticulosis without diverticulitis and atherosclerosis. Critical Value/emergent results were called by telephone at the time of interpretation on 04/30/2015 at 11:09 pm to PA Erin Obando , who verbally acknowledged these results.   Electronically Signed   By: Rubye Oaks M.D.   On: 04/30/2015 23:10   Dg Chest Port 1 View  05/01/2015   CLINICAL DATA:  Shortness of breath.  EXAM: PORTABLE CHEST - 1 VIEW  COMPARISON:  CT abdomen/pelvis 1 day prior.  FINDINGS: The heart is enlarged. Mild vascular congestion without overt edema. Minimal atelectasis at the left lung base. No confluent airspace disease. The small right pleural effusion on prior CT is not seen on this portable AP view. No pneumothorax. No acute osseous abnormality.  IMPRESSION: Cardiomegaly with mild vascular congestion.   Electronically Signed   By: Rubye Oaks M.D.   On: 05/01/2015 02:46   I have personally reviewed and evaluated these images and lab results as part of my medical decision-making.   EKG Interpretation None      MDM   Final diagnoses:  Pulmonary embolus  Cardiomegaly  AKI (acute kidney injury)    Labs: CBC, CMP, lipase, urinalysis- significant for AST 120, ALT 272, bili 1.8  Imaging: CT abdomen and pelvis- pulmonary embolism, pleural effusion, whole body wall edema.  Consults: Hospitalistist  Therapeutics: Zofran normal saline, heparin  Discharge Meds:   Assessment/Plan: Patient presents with abnormal labs today. He is in no acute distress, with minimal complaints of nausea and some vomiting. Patient was found to have a pulmonary embolism on CT scan, cardiomegaly, JVD. Patient  will not be able to receive a CT angiogram tonight, has some worsening dyspnea on exertion, will need cardiology follow-up. Patient will be started on heparin, admitted to hospital for further evaluation and management of his pulmonary embolism and cardiac issues. Patient's vital signs remained stable here in the ED, he was not tachycardic tachypneic, hypotensive.         Eyvonne Mechanic, PA-C 05/01/15 1409  Doug Sou, MD 05/01/15 [phone]

## 2015-04-30 NOTE — ED Notes (Addendum)
Pt arrived during downtime.  Pt presents after having abnormal blab work. Denies pain. C/o N/V x "several weeks". Paperwork from pcp shows elevated AST (128) and ALT (303).   Orders:  Cbc, CMP, lipase, urinalysis

## 2015-05-01 ENCOUNTER — Inpatient Hospital Stay (HOSPITAL_COMMUNITY): Payer: BLUE CROSS/BLUE SHIELD

## 2015-05-01 ENCOUNTER — Encounter (HOSPITAL_COMMUNITY): Payer: Self-pay | Admitting: Internal Medicine

## 2015-05-01 DIAGNOSIS — I213 ST elevation (STEMI) myocardial infarction of unspecified site: Secondary | ICD-10-CM

## 2015-05-01 DIAGNOSIS — I513 Intracardiac thrombosis, not elsewhere classified: Secondary | ICD-10-CM | POA: Diagnosis present

## 2015-05-01 DIAGNOSIS — I517 Cardiomegaly: Secondary | ICD-10-CM | POA: Diagnosis present

## 2015-05-01 DIAGNOSIS — R945 Abnormal results of liver function studies: Secondary | ICD-10-CM | POA: Diagnosis present

## 2015-05-01 DIAGNOSIS — I472 Ventricular tachycardia: Secondary | ICD-10-CM

## 2015-05-01 DIAGNOSIS — R7989 Other specified abnormal findings of blood chemistry: Secondary | ICD-10-CM

## 2015-05-01 DIAGNOSIS — N289 Disorder of kidney and ureter, unspecified: Secondary | ICD-10-CM

## 2015-05-01 DIAGNOSIS — I4729 Other ventricular tachycardia: Secondary | ICD-10-CM

## 2015-05-01 DIAGNOSIS — J4520 Mild intermittent asthma, uncomplicated: Secondary | ICD-10-CM

## 2015-05-01 DIAGNOSIS — I2699 Other pulmonary embolism without acute cor pulmonale: Secondary | ICD-10-CM

## 2015-05-01 DIAGNOSIS — I5041 Acute combined systolic (congestive) and diastolic (congestive) heart failure: Secondary | ICD-10-CM

## 2015-05-01 DIAGNOSIS — I340 Nonrheumatic mitral (valve) insufficiency: Secondary | ICD-10-CM | POA: Diagnosis present

## 2015-05-01 LAB — CBC WITH DIFFERENTIAL/PLATELET
BASOS ABS: 0 10*3/uL (ref 0.0–0.1)
BASOS PCT: 0 % (ref 0–1)
EOS ABS: 0 10*3/uL (ref 0.0–0.7)
Eosinophils Relative: 0 % (ref 0–5)
HEMATOCRIT: 45.1 % (ref 39.0–52.0)
HEMOGLOBIN: 14.6 g/dL (ref 13.0–17.0)
Lymphocytes Relative: 15 % (ref 12–46)
Lymphs Abs: 1.4 10*3/uL (ref 0.7–4.0)
MCH: 29.9 pg (ref 26.0–34.0)
MCHC: 32.4 g/dL (ref 30.0–36.0)
MCV: 92.2 fL (ref 78.0–100.0)
MONOS PCT: 6 % (ref 3–12)
Monocytes Absolute: 0.6 10*3/uL (ref 0.1–1.0)
NEUTROS ABS: 7.3 10*3/uL (ref 1.7–7.7)
NEUTROS PCT: 79 % — AB (ref 43–77)
Platelets: 168 10*3/uL (ref 150–400)
RBC: 4.89 MIL/uL (ref 4.22–5.81)
RDW: 15.4 % (ref 11.5–15.5)
WBC: 9.3 10*3/uL (ref 4.0–10.5)

## 2015-05-01 LAB — BRAIN NATRIURETIC PEPTIDE: B NATRIURETIC PEPTIDE 5: 2662.2 pg/mL — AB (ref 0.0–100.0)

## 2015-05-01 LAB — RAPID URINE DRUG SCREEN, HOSP PERFORMED
Amphetamines: NOT DETECTED
BARBITURATES: NOT DETECTED
BENZODIAZEPINES: NOT DETECTED
COCAINE: NOT DETECTED
OPIATES: NOT DETECTED
Tetrahydrocannabinol: NOT DETECTED

## 2015-05-01 LAB — BASIC METABOLIC PANEL
ANION GAP: 8 (ref 5–15)
BUN: 20 mg/dL (ref 6–20)
CALCIUM: 8.6 mg/dL — AB (ref 8.9–10.3)
CO2: 23 mmol/L (ref 22–32)
CREATININE: 1.26 mg/dL — AB (ref 0.61–1.24)
Chloride: 104 mmol/L (ref 101–111)
GLUCOSE: 128 mg/dL — AB (ref 65–99)
Potassium: 4.4 mmol/L (ref 3.5–5.1)
Sodium: 135 mmol/L (ref 135–145)

## 2015-05-01 LAB — HEPARIN LEVEL (UNFRACTIONATED)
HEPARIN UNFRACTIONATED: 0.15 [IU]/mL — AB (ref 0.30–0.70)
Heparin Unfractionated: 0.34 IU/mL (ref 0.30–0.70)
Heparin Unfractionated: 0.62 IU/mL (ref 0.30–0.70)

## 2015-05-01 LAB — HEPATIC FUNCTION PANEL
ALT: 225 U/L — ABNORMAL HIGH (ref 17–63)
AST: 90 U/L — AB (ref 15–41)
Albumin: 3.4 g/dL — ABNORMAL LOW (ref 3.5–5.0)
Alkaline Phosphatase: 87 U/L (ref 38–126)
BILIRUBIN DIRECT: 0.5 mg/dL (ref 0.1–0.5)
BILIRUBIN INDIRECT: 1.2 mg/dL — AB (ref 0.3–0.9)
BILIRUBIN TOTAL: 1.7 mg/dL — AB (ref 0.3–1.2)
Total Protein: 5.7 g/dL — ABNORMAL LOW (ref 6.5–8.1)

## 2015-05-01 LAB — TROPONIN I
TROPONIN I: 0.03 ng/mL (ref <0.031)
TROPONIN I: 0.03 ng/mL (ref <0.031)

## 2015-05-01 LAB — ACETAMINOPHEN LEVEL

## 2015-05-01 LAB — MAGNESIUM: MAGNESIUM: 1.9 mg/dL (ref 1.7–2.4)

## 2015-05-01 LAB — TSH: TSH: 1.603 u[IU]/mL (ref 0.350–4.500)

## 2015-05-01 MED ORDER — VITAMINS A & D EX OINT
TOPICAL_OINTMENT | CUTANEOUS | Status: AC
  Administered 2015-05-01: 5
  Filled 2015-05-01: qty 5

## 2015-05-01 MED ORDER — ALBUTEROL SULFATE (2.5 MG/3ML) 0.083% IN NEBU
3.0000 mL | INHALATION_SOLUTION | RESPIRATORY_TRACT | Status: DC | PRN
  Administered 2015-05-01: 3 mL via RESPIRATORY_TRACT
  Filled 2015-05-01: qty 3

## 2015-05-01 MED ORDER — FUROSEMIDE 10 MG/ML IJ SOLN
20.0000 mg | Freq: Once | INTRAMUSCULAR | Status: AC
  Administered 2015-05-01: 20 mg via INTRAVENOUS
  Filled 2015-05-01: qty 2

## 2015-05-01 MED ORDER — ONDANSETRON HCL 4 MG/2ML IJ SOLN: 4.0000 mg | Freq: Four times a day (QID) | INTRAMUSCULAR | Status: DC | PRN

## 2015-05-01 MED ORDER — HEPARIN BOLUS VIA INFUSION
2000.0000 [IU] | Freq: Once | INTRAVENOUS | Status: AC
  Administered 2015-05-01: 2000 [IU] via INTRAVENOUS
  Filled 2015-05-01: qty 2000

## 2015-05-01 MED ORDER — LIVING BETTER WITH HEART FAILURE BOOK
Freq: Once | Status: AC
  Administered 2015-05-03: 19:00:00

## 2015-05-01 MED ORDER — MAGNESIUM OXIDE 400 (241.3 MG) MG PO TABS
200.0000 mg | ORAL_TABLET | Freq: Every day | ORAL | Status: DC
  Administered 2015-05-01 – 2015-05-16 (×16): 200 mg via ORAL
  Filled 2015-05-01 (×3): qty 1
  Filled 2015-05-01 (×2): qty 0.5
  Filled 2015-05-01 (×2): qty 1
  Filled 2015-05-01: qty 0.5
  Filled 2015-05-01 (×3): qty 1
  Filled 2015-05-01: qty 0.5
  Filled 2015-05-01 (×4): qty 1
  Filled 2015-05-01: qty 0.5
  Filled 2015-05-01 (×2): qty 1

## 2015-05-01 MED ORDER — FUROSEMIDE 10 MG/ML IJ SOLN
40.0000 mg | Freq: Two times a day (BID) | INTRAMUSCULAR | Status: DC
  Administered 2015-05-01: 40 mg via INTRAVENOUS
  Filled 2015-05-01: qty 4

## 2015-05-01 MED ORDER — CLONAZEPAM 1 MG PO TABS
1.0000 mg | ORAL_TABLET | Freq: Every day | ORAL | Status: DC
  Administered 2015-05-01 – 2015-05-15 (×15): 1 mg via ORAL
  Filled 2015-05-01 (×3): qty 1
  Filled 2015-05-01: qty 2
  Filled 2015-05-01 (×11): qty 1

## 2015-05-01 MED ORDER — ONDANSETRON HCL 4 MG PO TABS: 4.0000 mg | ORAL_TABLET | Freq: Four times a day (QID) | ORAL | Status: DC | PRN

## 2015-05-01 MED ORDER — CARVEDILOL 3.125 MG PO TABS
3.1250 mg | ORAL_TABLET | Freq: Two times a day (BID) | ORAL | Status: DC
  Administered 2015-05-01 – 2015-05-06 (×7): 3.125 mg via ORAL
  Filled 2015-05-01 (×8): qty 1

## 2015-05-01 MED ORDER — SODIUM CHLORIDE 0.9 % IJ SOLN
3.0000 mL | Freq: Two times a day (BID) | INTRAMUSCULAR | Status: DC
  Administered 2015-05-02 – 2015-05-08 (×6): 3 mL via INTRAVENOUS

## 2015-05-01 MED ORDER — MOMETASONE FURO-FORMOTEROL FUM 100-5 MCG/ACT IN AERO
2.0000 | INHALATION_SPRAY | Freq: Two times a day (BID) | RESPIRATORY_TRACT | Status: DC
  Administered 2015-05-02 – 2015-05-06 (×5): 2 via RESPIRATORY_TRACT
  Filled 2015-05-01 (×5): qty 8.8

## 2015-05-01 MED ORDER — FUROSEMIDE 10 MG/ML IJ SOLN
40.0000 mg | Freq: Two times a day (BID) | INTRAMUSCULAR | Status: AC
  Administered 2015-05-01: 40 mg via INTRAVENOUS
  Filled 2015-05-01: qty 4

## 2015-05-01 MED ORDER — GABAPENTIN 300 MG PO CAPS
300.0000 mg | ORAL_CAPSULE | Freq: Every day | ORAL | Status: DC
  Administered 2015-05-01 – 2015-05-15 (×15): 300 mg via ORAL
  Filled 2015-05-01 (×16): qty 1

## 2015-05-01 MED ORDER — FUROSEMIDE 10 MG/ML IJ SOLN: 40.0000 mg | Freq: Two times a day (BID) | INTRAMUSCULAR | Status: DC

## 2015-05-01 MED ORDER — ACETAMINOPHEN 325 MG PO TABS: 650.0000 mg | ORAL_TABLET | Freq: Four times a day (QID) | ORAL | Status: DC | PRN

## 2015-05-01 NOTE — Clinical Documentation Improvement (Signed)
Hospitalist  Can the diagnosis of CHF be further specified?    Acuity - Acute, Chronic, Acute on Chronic   Type--Diastolic, Systolic  Other  Clinically Undetermined   Document any associated diagnoses/conditions   Supporting Information: Cardiomegaly with exertional shortness of breath concerning for CHF - I'm going to give Lasix 20 mg IV at this time and follow response per 8/25 progress notes.  8/25: BNP:  2662.2.   Please exercise your independent, professional judgment when responding. A specific answer is not anticipated or expected.   Thank You,  Rodman Pickle [employer] [place]

## 2015-05-01 NOTE — Progress Notes (Signed)
VASCULAR LAB PRELIMINARY  PRELIMINARY  PRELIMINARY  PRELIMINARY  Bilateral lower extremity venous duplex completed.    Preliminary report:  Right: Acute DVT noted in the distal to mid popliteal vein .  No evidence of superficial thrombosis.  No Baker's cyst. Left- No evidence of DVT. There is a superficial thrombus of the greater saphenous vein distal to the proximal   Marcus Bates, RVS 05/01/2015, 4:11 PM

## 2015-05-01 NOTE — H&P (Signed)
[facility] History and Physical  Marcus Bates:[id number] DOB: 29-Apr-1956 DOA: 04/30/2015  Referring physician: Mr. Curlene Dolphin. PCP: Lolita Patella, MD  Specialists: Dr. Jetty Duhamel. Pulmonologist.  Chief Complaint: Abdominal LFTs.  HPI: Marcus Bates is a 59 y.o. male with history of asthma was referred to the ER after patient was found to have abnormal LFTs. Patient states over the last 1 month patient has been having exertional shortness of breath with nausea. Patient had lab work done in his PCPs office which showed abnormal LFTs and was referred to the ER. Patient states that he has been having increasing wheezing recently and last month as Advair Diskus dose was increased. Patient also has noticed some lower extremity swelling recently. Last month he noticed in the right lower extremity and this week he noticed on the left lower extremity. CT abdomen and pelvis done in the ER was showing nothing acute in the liver area but showed abdominal wall and mesenteric edema with 4 chambers cardiomegaly. In addition to also showed pulmonary embolism. On exam patient also has lower extremity edema more on the left side. LFTs are mildly elevated. Patient does not have any fever or chills or productive cough. Denies any chest pain. Patient's symptoms are more concerning for CHF at this time.  Review of Systems: As presented in the history of presenting illness, rest negative.  Past Medical History  Diagnosis Date  . Asthma   . Anxiety   . Depression   . Insomnia    Past Surgical History  Procedure Laterality Date  . Tonsillectomy    . Mastoidectomy     Social History:  reports that he has never smoked. He has never used smokeless tobacco. He reports that he does not drink alcohol. His drug history is not on file. Where does patient live at home. Can patient participate in ADLs? Yes.  Allergies  Allergen Reactions  . Aspirin     REACTION: facial swelling  . Dust Mite Extract    . Other     Cats   . Peanuts [Peanut Oil]     sob    Family History:  Family History  Problem Relation Age of Onset  . Heart disease Other   . Pulmonary embolism Neg Hx   . CAD Maternal Grandfather   . Stroke Paternal Grandfather       Prior to Admission medications   Medication Sig Start Date End Date Taking? Authorizing Provider  acetaminophen (TYLENOL) 500 MG tablet Take 500 mg by mouth every 6 (six) hours as needed (calm nerves).   Yes Historical Provider, MD  ADVAIR DISKUS 250-50 MCG/DOSE AEPB TAKE 1 PUFF BY MOUTH TWICE A DAY 04/16/15  Yes Historical Provider, MD  albuterol (PROVENTIL HFA;VENTOLIN HFA) 108 (90 BASE) MCG/ACT inhaler Inhale 2 puffs into the lungs every 4 (four) hours as needed for wheezing or shortness of breath. 03/29/15  Yes Ozella Rocks, MD  amphetamine-dextroamphetamine (ADDERALL) 10 MG tablet TAKE 1 TABLET IN THE MORNING TWICE A DAY 03/07/15  Yes Historical Provider, MD  clonazePAM (KLONOPIN) 0.5 MG tablet TAKE 1 TABLET BY MOUTH EVERY EVENING AS DIRECTED Patient taking differently: TAKE 1 TABLET BY MOUTH EVERY EVENING AS NEEDED FOR ANXIETY 11/28/14  Yes Waymon Budge, MD  gabapentin (NEURONTIN) 600 MG tablet Take 300 mg by mouth at bedtime.    Yes Historical Provider, MD  methylphenidate (METADATE ER) 20 MG ER tablet Take 1 tablet (20 mg total) by mouth daily. Patient not taking: Reported on 04/30/2015 08/12/14  Waymon Budge, MD  mupirocin ointment (BACTROBAN) 2 % Apply 1 application topically 2 (two) times daily. Patient not taking: Reported on 04/30/2015 03/29/15   Ozella Rocks, MD  predniSONE (DELTASONE) 50 MG tablet Take daily with breakfast Patient not taking: Reported on 04/30/2015 03/29/15   Ozella Rocks, MD    Physical Exam: Filed Vitals:   04/30/15 2337 04/30/15 2342 04/30/15 2350 05/01/15 0134  BP: 108/81  117/88 116/88  Pulse: 102  104 103  Temp:   97.5 F (36.4 C) 98 F (36.7 C)  TempSrc:   Oral Oral  Resp: Height:  5'  8" (1.727 m)    Weight:  72.576 kg (160 lb)    SpO2: 99%  98% 97%     General:  Moderately built and nourished.  Eyes: Anicteric no pallor.  ENT: No discharge from the ears eyes nose and mouth.  Neck: JVD is elevated. No mass felt.  Cardiovascular: S1-S2 heard.  Respiratory: No rhonchi or crepitations.  Abdomen: Soft nontender bowel sounds present.  Skin: No rash.  Musculoskeletal: Mild edema the left lower extremity.  Psychiatric: Appears normal.  Neurologic: Alert awake oriented to time place and person. Moves all her activities.  Labs on Admission:  Basic Metabolic Panel:  Recent Labs Lab 04/30/15 1733  NA 136  K 4.9  CL 100*  CO2 24  GLUCOSE 95  BUN 19  CREATININE 1.27*  CALCIUM 9.1   Liver Function Tests:  Recent Labs Lab 04/30/15 1733  AST 120*  ALT 272*  ALKPHOS 100  BILITOT 1.8*  PROT 6.5  ALBUMIN 4.0    Recent Labs Lab 04/30/15 1733  LIPASE 23   No results for input(s): AMMONIA in the last 168 hours. CBC:  Recent Labs Lab 04/30/15 1733  WBC 8.1  HGB 16.1  HCT 48.5  MCV 92.7  PLT 200   Cardiac Enzymes: No results for input(s): CKTOTAL, CKMB, CKMBINDEX, TROPONINI in the last 168 hours.  BNP (last 3 results) No results for input(s): BNP in the last 8760 hours.  ProBNP (last 3 results) No results for input(s): PROBNP in the last 8760 hours.  CBG: No results for input(s): GLUCAP in the last 168 hours.  Radiological Exams on Admission: Ct Abdomen Pelvis W Contrast  04/30/2015   CLINICAL DATA:  Abnormal labs. Elevated AST and ALT. Right upper quadrant mass. Nausea and vomiting for several weeks.  EXAM: CT ABDOMEN AND PELVIS WITH CONTRAST  TECHNIQUE: Multidetector CT imaging of the abdomen and pelvis was performed using the standard protocol following bolus administration of intravenous contrast.  CONTRAST:  100 mL Omnipaque 300 IV  COMPARISON:  None.  FINDINGS: Lower chest: Multi chamber cardiomegaly. Small right effusion.  Adjacent ground-glass opacities in the right lower lobe. There is an apparent filling defect within the right lower lobe pulmonary artery. Minimal linear atelectasis in the lingula.  Liver: Contrast refluxing into the IVC and hepatic veins. Liver appears normal in size. No evidence of focal hepatic lesion. No definite morphologic findings of cirrhosis. There is a small amount perihepatic ascites.  Hepatobiliary: Gallbladder physiologically distended, questionable gallbladder wall thickening. No calcified gallstone. No biliary dilatation, common bile duct measures 6 mm at the porta hepatis.  Pancreas: Fatty atrophy of the head and uncinate process. No focal lesion. No ductal dilatation.  Spleen: Normal in size without focal lesion.  Adrenal glands: No nodule.  Kidneys: Symmetric renal enhancement. No hydronephrosis. No focal lesion.  Stomach/Bowel: Stomach physiologically distended.  There are no dilated or thickened small bowel loops. Minimal thickening involving the ascending colon. Small volume of colonic stool. Distal diverticulosis without diverticulitis. The appendix is normal.  Vascular/Lymphatic: No retroperitoneal adenopathy. Abdominal aorta is normal in caliber. Moderate atherosclerosis of the abdominal aorta and its branches. No mesenteric or pelvic adenopathy.  Reproductive: Prostate gland is normal in size.  Bladder: Physiologically distended. Equivocal bladder wall thickening.  Other: Small amount of intra-abdominal ascites primarily in the perihepatic space, tracking in the right pericolic gutter and in the pelvis. No free air or loculated intra-abdominal fluid collection. Small fat containing umbilical hernia. There is mild whole body wall and mesenteric edema.  Musculoskeletal: There are no acute or suspicious osseous abnormalities. Degenerative change in the lumbar spine and at the pubic symphysis.  IMPRESSION: 1. No evidence of focal right upper quadrant or intra-abdominal mass. 2. Small right  pleural effusion, small amount of intra-abdominal and pelvic ascites, and whole body wall edema. Findings may be related to liver dysfunction, however there are no CT morphologic findings of cirrhosis. There is multi chamber cardiomegaly, and right heart failure could produce a similar appearance. Suggestion of gallbladder wall thickening is likely related to systemic process. No biliary obstruction. 3. Incidental note of pulmonary embolus in a segmental right lower lobe pulmonary artery. 4. Colonic diverticulosis without diverticulitis and atherosclerosis. Critical Value/emergent results were called by telephone at the time of interpretation on 04/30/2015 at 11:09 pm to PA JEFFREY HEDGES , who verbally acknowledged these results.   Electronically Signed   By: Rubye Oaks M.D.   On: 04/30/2015 23:10    EKG: Independently reviewed. Normal sinus rhythm with PVCs and lateral T-wave changes. Poor R-wave progression.  Assessment/Plan Principal Problem:   Pulmonary embolus Active Problems:   Cardiomegaly   Abnormal LFTs   PE (pulmonary embolism)   1. Pulmonary embolism unprovoked but hemodynamically stable - patient has been started on heparin infusion at this time. Check Dopplers of the lower extremity. Precipitating cause not clear. Will need eventual follow-up with hematologist. 2. Cardiomegaly with exertional shortness of breath concerning for CHF - I'm going to give Lasix 20 mg IV at this time and follow response. Closely follow intake output metabolic panel. Check 2-D echo. Closely follow daily weights. 3. Abnormal LFTs - may be secondary to CHF. For now I have ordered acute hepatitis panel and Tylenol levels. Closely follow LFTs. May need GI consult. 4. History of asthma - pressing and wheezing. Continue Advair Diskus and when necessary albuterol. 5. Patient on Neurontin which will be continued.  Patient's chest x-ray is pending. EKG is not crossing over.   DVT Prophylaxis Lovenox.   Code Status: Full code.  Family Communication: Discussed with patient.  Disposition Plan: Admit to inpatient.    Rickiya Picariello N. [facility] Pager [phone].  If 7PM-7AM, please contact night-coverage [email or web address] Password [email or web address] 05/01/2015, 2:02 AM

## 2015-05-01 NOTE — Progress Notes (Signed)
  Echocardiogram 2D Echocardiogram has been performed.  Marcus Bates 05/01/2015, 11:09 AM

## 2015-05-01 NOTE — Progress Notes (Addendum)
ANTICOAGULATION CONSULT NOTE  Pharmacy Consult for IV Heparin Indication: pulmonary embolus  Allergies  Allergen Reactions  . Aspirin     REACTION: facial swelling  . Dust Mite Extract   . Other     Cats   . Peanuts [Peanut Oil]     sob    Patient Measurements: Height:  (172.7 cm) Weight: 156 lb 1.6 oz (70.806 kg) IBW/kg (Calculated) : 68.4  Vital Signs: Temp: 97.8 F (36.6 C) (08/25 0741) Temp Source: Oral (08/25 0741) BP: 113/81 mmHg (08/25 0741) Pulse Rate: 82 (08/25 0741)  Labs:  Recent Labs  04/30/15 1733 04/30/15 2146 05/01/15 0215 05/01/15 0710  HGB 16.1  --  14.6  --   HCT 48.5  --  45.1  --   PLT 200  --  168  --   APTT  --  30  --   --   LABPROT  --  16.7*  --   --   INR  --  1.34  --   --   HEPARINUNFRC  --   --   --  0.34  CREATININE 1.27*  --  1.26*  --   TROPONINI  --   --  <0.03 0.03    Estimated Creatinine Clearance: 61.1 mL/min (by C-G formula based on Cr of 1.26).   Medical History: Past Medical History  Diagnosis Date  . Asthma   . Anxiety   . Depression   . Insomnia     Medications:  Prescriptions prior to admission  Medication Sig Dispense Refill Last Dose  . acetaminophen (TYLENOL) 500 MG tablet Take 500 mg by mouth every 6 (six) hours as needed (calm nerves).   04/29/2015 at Unknown time  . ADVAIR DISKUS 250-50 MCG/DOSE AEPB TAKE 1 PUFF BY MOUTH TWICE A DAY  5 04/29/2015 at Unknown time  . albuterol (PROVENTIL HFA;VENTOLIN HFA) 108 (90 BASE) MCG/ACT inhaler Inhale 2 puffs into the lungs every 4 (four) hours as needed for wheezing or shortness of breath. 1 Inhaler 2 04/29/2015 at Unknown time  . amphetamine-dextroamphetamine (ADDERALL) 10 MG tablet TAKE 1 TABLET IN THE MORNING TWICE A DAY  0 Past Month at Unknown time  . clonazePAM (KLONOPIN) 0.5 MG tablet TAKE 1 TABLET BY MOUTH EVERY EVENING AS DIRECTED (Patient taking differently: TAKE 1 TABLET BY MOUTH EVERY EVENING AS NEEDED FOR ANXIETY) 30 tablet 0 Past Week at Unknown  time  . gabapentin (NEURONTIN) 600 MG tablet Take 300 mg by mouth at bedtime.    04/29/2015 at Unknown time  . methylphenidate (METADATE ER) 20 MG ER tablet Take 1 tablet (20 mg total) by mouth daily. (Patient not taking: Reported on 04/30/2015) 30 tablet 0 Not Taking at Unknown time  . mupirocin ointment (BACTROBAN) 2 % Apply 1 application topically 2 (two) times daily. (Patient not taking: Reported on 04/30/2015) 22 g 0 Not Taking at Unknown time  . predniSONE (DELTASONE) 50 MG tablet Take daily with breakfast (Patient not taking: Reported on 04/30/2015) 5 tablet 0 Not Taking at Unknown time   Scheduled:  . clonazePAM  1 mg Oral QHS  . furosemide  40 mg Intravenous Q12H  . gabapentin  300 mg Oral QHS  . mometasone-formoterol  2 puff Inhalation BID  . sodium chloride  3 mL Intravenous Q12H   Infusions:  . heparin 1,200 Units/hr (05/01/15 0010)    Assessment: 56 yoM who was referred to ED with abnormal lab work (elevated LFTs).  While there he had complaints of LE swelling,  SOB, and nausea.  CT of abdomen shows incidental finding of RLL PE.  IV Heparin per Rx started for PE.  05/01/2015:  Initial heparin level therapeutic No bleeding reported  Goal of Therapy:  Heparin level 0.3-0.7 units/ml Monitor platelets by anticoagulation protocol: Yes   Plan:   Continue heparin drip @ 1200 units/hr  Daily CBC/HL while on heparin  Recheck HL in 6 hours to verify therapeutic level at steady state  F/U plans for oral anticoagulation long-term  Elson Clan 05/01/2015,9:13 AM  Addendum: Repeat level subtherapeutic Heparin bolus 2000 units IV x1 then increase rate to 1450units/hr Recheck 6hr HL Daily HL and CBC while on heparin Noted plans for cardiac cath in am- f/u long-term anticoagulation plans post-procedure  Junita Push, PharmD, BCPS Pager: [phone] 05/01/2015@2 :15 PM

## 2015-05-01 NOTE — Progress Notes (Signed)
This RN spoke with Merdis Delay, NP who advised RN to paged on call cardiologist to clarify whether or not that MD wants to be paged regarding the ectopic beats. On call Cardiologist Fellow paged. Marcus Bates

## 2015-05-01 NOTE — Progress Notes (Signed)
MD notified  
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
## 2015-05-01 NOTE — Progress Notes (Signed)
Pt had a 10 beat run of Vtach. Per dayshift RN, MD wanted [place] to page still for every ectopic rhythm. On call NP, K. Schorr, notified. Will continue to monitor closely. Mardene Celeste I

## 2015-05-01 NOTE — Progress Notes (Signed)
Pharmacy: Re-heparin  Patient's a 59 y.o M on heparin for PE and suspected LV thrombus.  Heparin level now therapeutic at 0.62 (goal 0.3-0.6) after rate increased to 1450 units/hr.  Plan: - continue heparin drip at 1450 units/hr - f/u with AM labs and after cath procedure on 8/25.  Dorna Leitz, PharmD, BCPS 05/01/2015 10:26 PM

## 2015-05-01 NOTE — Care Management Note (Signed)
Case Management Note  Patient Details  Name: Marcus Bates MRN: [id number] Date of Birth: 1955-11-09  Subjective/Objective:  59 y/o m admitted w/PE, CHF. From home.                  Action/Plan:d/c plan home.   Expected Discharge Date:                 Expected Discharge Plan:  Home/Self Care  In-House Referral:     Discharge planning Services  CM Consult  Post Acute Care Choice:    Choice offered to:     DME Arranged:    DME Agency:     HH Arranged:    HH Agency:     Status of Service:  In process, will continue to follow  Medicare Important Message Given:    Date Medicare IM Given:    Medicare IM give by:    Date Additional Medicare IM Given:    Additional Medicare Important Message give by:     If discussed at Long Length of Stay Meetings, dates discussed:    Additional Comments:  Lanier Clam, RN 05/01/2015, 3:17 PM

## 2015-05-01 NOTE — Progress Notes (Signed)
RN spoke with on call cardiology MD, MD stated he did not want to be paged each time the patient had ectopic beats. Will continue to monitor pt. Marcus Bates

## 2015-05-01 NOTE — Progress Notes (Addendum)
PROGRESS NOTE  Marcus Bates CVK:[id number] DOB: 11-12-1955 DOA: 04/30/2015 PCP: Lolita Patella, MD   HPI: 59 yo M admitted on 8/25 with elevated LFTs and reports of increasing dyspnea with exertion for the past month as well as lower extremity swelling. On admission he underwent a CT abdomen and pelvis which showed anasarca as well as cardiomegaly, also an incidental PE in a segmental right lower lobe pulmonary artery.   Subjective / 24 H Interval events - feeling better, he is breathing better  Assessment/Plan: Principal Problem:   Acute combined systolic and diastolic congestive heart failure Active Problems:   Pulmonary embolus   Cardiomegaly   Abnormal LFTs   PE (pulmonary embolism)  Acute combined systolic and diastolic heart failure - 2D echo with EF 10-15% and grade 3 diastolic dysfunction - he has LE pitting edema, anasarca on CT scan, JVD, will diurese with Lasix 40 mg IV BID, daily weights, I&Os - cardiology consulted - unclear etiology, no reported chest pains, not ETOH drinker  NSVT - due to #1, cardiology consult - K 4.4, obtain Mg levels  Abnormal LFTs  - due to congestive hepatopathy due to heart failure - no evidence of cirrhosis on imaging  PE  - incidental finding, doubt that this is hemodynamically significant, continue anticoagulation  Asthma - stable, no wheezing   Diet: Diet Heart Room service appropriate?: Yes; Fluid consistency:: Thin; Fluid restriction:: 1200 mL Fluid Fluids: none  DVT Prophylaxis: heparin gtt  Code Status: Full Code Family Communication: no family bedside  Disposition Plan: remain inpatient  Consultants:  Cardiology  Procedures:  2D echo Study Conclusions - Left ventricle: Possible laminated echodense area on inferiorwall, cannot exclude laminated thrombus. The cavity size wasmoderately dilated. Systolic function was normal. The estimatedejection fraction was in the range of 10% to 15%. Severe  diffusehypokinesis. Although no diagnostic regional wall motionabnormality was identified, this possibility cannot be completelyexcluded on the basis of this study. Doppler parameters areconsistent with a reversible restrictive pattern, indicative ofdecreased left ventricular diastolic compliance and/or increasedleft atrial pressure (grade 3 diastolic dysfunction). - Mitral valve: There was severe regurgitation. - Left atrium: The atrium was severely dilated. - Right ventricle: The cavity size was dilated. Wall thickness wasnormal. - Right atrium: The atrium was moderately dilated.   Antibiotics  Anti-infectives    None       Studies  Ct Abdomen Pelvis W Contrast  04/30/2015   CLINICAL DATA:  Abnormal labs. Elevated AST and ALT. Right upper quadrant mass. Nausea and vomiting for several weeks.  EXAM: CT ABDOMEN AND PELVIS WITH CONTRAST  TECHNIQUE: Multidetector CT imaging of the abdomen and pelvis was performed using the standard protocol following bolus administration of intravenous contrast.  CONTRAST:  100 mL Omnipaque 300 IV  COMPARISON:  None.  FINDINGS: Lower chest: Multi chamber cardiomegaly. Small right effusion. Adjacent ground-glass opacities in the right lower lobe. There is an apparent filling defect within the right lower lobe pulmonary artery. Minimal linear atelectasis in the lingula.  Liver: Contrast refluxing into the IVC and hepatic veins. Liver appears normal in size. No evidence of focal hepatic lesion. No definite morphologic findings of cirrhosis. There is a small amount perihepatic ascites.  Hepatobiliary: Gallbladder physiologically distended, questionable gallbladder wall thickening. No calcified gallstone. No biliary dilatation, common bile duct measures 6 mm at the porta hepatis.  Pancreas: Fatty atrophy of the head and uncinate process. No focal lesion. No ductal dilatation.  Spleen: Normal in size without focal lesion.  Adrenal glands: No nodule.  Kidneys:  Symmetric renal enhancement. No hydronephrosis. No focal lesion.  Stomach/Bowel: Stomach physiologically distended. There are no dilated or thickened small bowel loops. Minimal thickening involving the ascending colon. Small volume of colonic stool. Distal diverticulosis without diverticulitis. The appendix is normal.  Vascular/Lymphatic: No retroperitoneal adenopathy. Abdominal aorta is normal in caliber. Moderate atherosclerosis of the abdominal aorta and its branches. No mesenteric or pelvic adenopathy.  Reproductive: Prostate gland is normal in size.  Bladder: Physiologically distended. Equivocal bladder wall thickening.  Other: Small amount of intra-abdominal ascites primarily in the perihepatic space, tracking in the right pericolic gutter and in the pelvis. No free air or loculated intra-abdominal fluid collection. Small fat containing umbilical hernia. There is mild whole body wall and mesenteric edema.  Musculoskeletal: There are no acute or suspicious osseous abnormalities. Degenerative change in the lumbar spine and at the pubic symphysis.  IMPRESSION: 1. No evidence of focal right upper quadrant or intra-abdominal mass. 2. Small right pleural effusion, small amount of intra-abdominal and pelvic ascites, and whole body wall edema. Findings may be related to liver dysfunction, however there are no CT morphologic findings of cirrhosis. There is multi chamber cardiomegaly, and right heart failure could produce a similar appearance. Suggestion of gallbladder wall thickening is likely related to systemic process. No biliary obstruction. 3. Incidental note of pulmonary embolus in a segmental right lower lobe pulmonary artery. 4. Colonic diverticulosis without diverticulitis and atherosclerosis. Critical Value/emergent results were called by telephone at the time of interpretation on 04/30/2015 at 11:09 pm to PA JEFFREY HEDGES , who verbally acknowledged these results.   Electronically Signed   By: Rubye Oaks M.D.   On: 04/30/2015 23:10   Dg Chest Port 1 View  05/01/2015   CLINICAL DATA:  Shortness of breath.  EXAM: PORTABLE CHEST - 1 VIEW  COMPARISON:  CT abdomen/pelvis 1 day prior.  FINDINGS: The heart is enlarged. Mild vascular congestion without overt edema. Minimal atelectasis at the left lung base. No confluent airspace disease. The small right pleural effusion on prior CT is not seen on this portable AP view. No pneumothorax. No acute osseous abnormality.  IMPRESSION: Cardiomegaly with mild vascular congestion.   Electronically Signed   By: Rubye Oaks M.D.   On: 05/01/2015 02:46    Objective  Filed Vitals:   04/30/15 2350 05/01/15 0134 05/01/15 0618 05/01/15 0741  BP: 117/88 116/88 103/82 113/81  Pulse: 104 103 103 82  Temp: 97.5 F (36.4 C) 98 F (36.7 C) 97.5 F (36.4 C) 97.8 F (36.6 C)  TempSrc: Oral Oral Oral Oral  Resp: 19 20 20 20   Height:      Weight:   70.806 kg (156 lb 1.6 oz)   SpO2: 98% 97% 100% 100%    Intake/Output Summary (Last 24 hours) at 05/01/15 1054 Last data filed at 05/01/15 0946  Gross per 24 hour  Intake      0 ml  Output    350 ml  Net   -350 ml   Filed Weights   04/30/15 2342 05/01/15 0618  Weight: 72.576 kg (160 lb) 70.806 kg (156 lb 1.6 oz)   Exam:  GENERAL: NAD  HEENT: head NCAT, no scleral icterus.   NECK: Supple.   LUNGS: No wheezing or crackles  HEART: Regular rate and rhythm without murmur. 2+ pulses, + JVD, 1-2 + pitting LE edema   ABDOMEN: Soft, nontender, and nondistended. Positive bowel sounds.   EXTREMITIES: Without any cyanosis, clubbing, rash, lesions. + edema  NEUROLOGIC:  non focal   Data Reviewed: Basic Metabolic Panel:  Recent Labs Lab 04/30/15 1733 05/01/15 0215  NA 136 135  K 4.9 4.4  CL 100* 104  CO2 24 23  GLUCOSE 95 128*  BUN 19 20  CREATININE 1.27* 1.26*  CALCIUM 9.1 8.6*   Liver Function Tests:  Recent Labs Lab 04/30/15 1733 05/01/15 0215  AST 120* 90*  ALT 272* 225*  ALKPHOS  100 87  BILITOT 1.8* 1.7*  PROT 6.5 5.7*  ALBUMIN 4.0 3.4*    Recent Labs Lab 04/30/15 1733  LIPASE 23   CBC:  Recent Labs Lab 04/30/15 1733 05/01/15 0215  WBC 8.1 9.3  NEUTROABS  --  7.3  HGB 16.1 14.6  HCT 48.5 45.1  MCV 92.7 92.2  PLT 200 168   Cardiac Enzymes:  Recent Labs Lab 05/01/15 0215 05/01/15 0710  TROPONINI <0.03 0.03   BNP (last 3 results)  Recent Labs  05/01/15 0215  BNP 2662.2*   Scheduled Meds: . clonazePAM  1 mg Oral QHS  . furosemide  40 mg Intravenous Q12H  . gabapentin  300 mg Oral QHS  . mometasone-formoterol  2 puff Inhalation BID  . sodium chloride  3 mL Intravenous Q12H   Continuous Infusions: . heparin 1,200 Units/hr (05/01/15 0010)    Pamella Pert, MD [facility] Pager [phone]. If 7 PM - 7 AM, please contact night-coverage at [email or web address], password [facility] 05/01/2015, 10:54 AM  LOS: 1 day

## 2015-05-01 NOTE — Progress Notes (Signed)
ANTICOAGULATION CONSULT NOTE - Initial Consult  Pharmacy Consult for IV Heparin Indication: pulmonary embolus  Allergies  Allergen Reactions  . Aspirin     REACTION: facial swelling  . Dust Mite Extract   . Other     Cats   . Peanuts [Peanut Oil]     sob    Patient Measurements: Height: 5\' 8"  (172.7 cm) Weight: 160 lb (72.576 kg) IBW/kg (Calculated) : 68.4 Heparin Dosing Weight: 72 kg  Vital Signs: Temp: 97.5 F (36.4 C) (08/24 2350) Temp Source: Oral (08/24 2350) BP: 117/88 mmHg (08/24 2350) Pulse Rate: 104 (08/24 2350)  Labs:  Recent Labs  04/30/15 1733 04/30/15 2146  HGB 16.1  --   HCT 48.5  --   PLT 200  --   APTT  --  30  LABPROT  --  16.7*  INR  --  1.34  CREATININE 1.27*  --     Estimated Creatinine Clearance: 60.6 mL/min (by C-G formula based on Cr of 1.27).   Medical History: Past Medical History  Diagnosis Date  . Asthma   . Anxiety   . Depression   . Insomnia     Medications:   (Not in a hospital admission) Scheduled:   Infusions:  . heparin 1,200 Units/hr (05/01/15 0010)    Assessment: 58 yoM c/o n/v x several weeks in Ed with abdnormal lab work.  CT of abdomen shows incidental finding of RLL PE.  IV Heparin per Rx for PE. Goal of Therapy:  Heparin level 0.3-0.7 units/ml Monitor platelets by anticoagulation protocol: Yes   Plan:   Add aPtt to labs already drawn  Heparin 4000 unit IV x1  Start heparin drip @ 1200 units/hr  Daily CBC/HL  Check 1st HL in 6 hours  Susanne Greenhouse R 05/01/2015,12:11 AM

## 2015-05-01 NOTE — Progress Notes (Signed)
I have almost everything I need to send in for [employer] request - will fax everything in tomorrow when we have cath report. I have also given Dennis Bast (at our office) a heads up about the need for the vest. Ronie Spies PA-C

## 2015-05-01 NOTE — Consult Note (Addendum)
Cardiology Consultation Note  Patient ID: Marcus Bates, MRN: [id number], DOB/AGE: 12/19/1955 59 y.o. Admit date: 04/30/2015   Date of Consult: 05/01/2015 Primary Physician: Lolita Patella, MD Primary Cardiologist: Delton See  Chief Complaint: nausea Reason for Consultation: new EF 10-15%, NSVT, severe mitral regurgitation  HPI: Marcus Bates is a 59 y/o male with a history of asthma, depression/anxiety, restless legs, and prior heart murmur who presented to [facility] 04/30/2015 from PCP's office with nausea and elevated LFT's. He denies prior cardiac history other than being told he may have a leaky heart valve but denies prior workup for this. He reports over the last year he's had progressively worsening DOE. He used to run across a long parking lot at his job without difficulty. He was recently transferred to a unit without air conditioning and says now he has trouble walking the 1/8 mile walk from his car to the job site. While working in the heat he's been drinking water every chance he gets in order to stay hydrated. Over the last few days he's had worsening nausea and occasional vomiting. The nausea seemed to be worse with exertion. He has not had any chest pain. He had labwork done at his PCP's office which showed elevated LFTs and he was subsequently referred to the ER. He takes Tylenol regularly to help him sleep (states "it helps calm down my nervous system"). CT abdomen/pelvix showed no focal liver abnormalities but did show small right pleural effusion, small amount of intra-abdominal pelvic ascites/whole body wall edema, and incidental note of PE in segmental right lower lobe pulmonary artery. This is felt to be unprovoked. He was started on heparin and IV Lasix. He is already beginning to diurese well. Tele has revealed occasional runs of NSVT - longest 20beats (a few shorter runs 8-14 beats) and then several between 3-4 beats; also with occasional multifocal PVCs. He reports h/o palpitations but  has not felt any recently. No history of syncope.  He denies significant LEE, orthopnea, or PND but does report feeling increased nausea in recumbency.   Denies history of alcohol, tobacco, illicit drugs, or herbal supplements besides passionflower supplement. Home meds do include Adderall (rx'd for his sleep disorder per notes) - this is now on hold. He denies any recent viral illnesses - reports "I stay pretty healthy." No family history of PE, but + CAD in maternal grandfather who had MI and CABG in maternal aunt around age 32. His dad has a pacemaker.  2D echo 05/01/15 showed possible laminated echodense area on inferior wall, cannot exclude laminated thrombus; EF 10-15% with severe diffuse HK, cannot completely exclude WMA, grade 3 DD, severe MR, severe LAE, dilated RV, mod dilated RA. Labwork notable for K 4.4, Cr 1.26, AST/ALT 90/225, Mg 1.9, troponins neg x 2, normal TSH, BNP 2662, normal CBC. CXR showed cardiomegaly with mild vascular congestion.   Past Medical History  Diagnosis Date  . Asthma   . Anxiety   . Depression   . Insomnia   . Restless leg syndrome       Most Recent Cardiac Studies: 2D echo 05/01/15 - Left ventricle: Possible laminated echodense area on inferior wall, cannot exclude laminated thrombus. The cavity size was moderately dilated. Systolic function was normal. The estimated ejection fraction was in the range of 10% to 15%. Severe diffuse hypokinesis. Although no diagnostic regional wall motion abnormality was identified, this possibility cannot be completely excluded on the basis of this study. Doppler parameters are consistent with a reversible restrictive pattern, indicative  of decreased left ventricular diastolic compliance and/or increased left atrial pressure (grade 3 diastolic dysfunction). - Mitral valve: There was severe regurgitation. - Left atrium: The atrium was severely dilated. - Right ventricle: The cavity size was dilated.  Wall thickness was normal. - Right atrium: The atrium was moderately dilated.   Surgical History:  Past Surgical History  Procedure Laterality Date  . Tonsillectomy    . Mastoidectomy      Inpatient meds: . carvedilol  3.125 mg Oral BID WC  . clonazePAM  1 mg Oral QHS  . gabapentin  300 mg Oral QHS  . Living Better with Heart Failure Book   Does not apply Once  . magnesium oxide  200 mg Oral Daily  . mometasone-formoterol  2 puff Inhalation BID  . sodium chloride  3 mL Intravenous Q12H   Home Meds: Prior to Admission medications   Medication Sig Start Date End Date Taking? Authorizing Provider  acetaminophen (TYLENOL) 500 MG tablet Take 500 mg by mouth every 6 (six) hours as needed (calm nerves).   Yes Historical Provider, MD  ADVAIR DISKUS 250-50 MCG/DOSE AEPB TAKE 1 PUFF BY MOUTH TWICE A DAY 04/16/15  Yes Historical Provider, MD  albuterol (PROVENTIL HFA;VENTOLIN HFA) 108 (90 BASE) MCG/ACT inhaler Inhale 2 puffs into the lungs every 4 (four) hours as needed for wheezing or shortness of breath. 03/29/15  Yes Ozella Rocks, MD  amphetamine-dextroamphetamine (ADDERALL) 10 MG tablet TAKE 1 TABLET IN THE MORNING TWICE A DAY 03/07/15  Yes Historical Provider, MD  clonazePAM (KLONOPIN) 0.5 MG tablet TAKE 1 TABLET BY MOUTH EVERY EVENING AS DIRECTED Patient taking differently: TAKE 1 TABLET BY MOUTH EVERY EVENING AS NEEDED FOR ANXIETY 11/28/14  Yes Waymon Budge, MD  gabapentin (NEURONTIN) 600 MG tablet Take 300 mg by mouth at bedtime.    Yes Historical Provider, MD  methylphenidate (METADATE ER) 20 MG ER tablet Take 1 tablet (20 mg total) by mouth daily. Patient not taking: Reported on 04/30/2015 08/12/14   Waymon Budge, MD  mupirocin ointment (BACTROBAN) 2 % Apply 1 application topically 2 (two) times daily. Patient not taking: Reported on 04/30/2015 03/29/15   Ozella Rocks, MD  predniSONE (DELTASONE) 50 MG tablet Take daily with breakfast Patient not taking: Reported on 04/30/2015  03/29/15   Ozella Rocks, MD    Inpatient Medications:  . clonazePAM  1 mg Oral QHS  . furosemide  40 mg Intravenous Q12H  . gabapentin  300 mg Oral QHS  . mometasone-formoterol  2 puff Inhalation BID  . sodium chloride  3 mL Intravenous Q12H   . heparin 1,200 Units/hr (05/01/15 1339)    Allergies:  Allergies  Allergen Reactions  . Aspirin     REACTION: facial swelling  . Dust Mite Extract   . Other     Cats   . Peanuts [Peanut Oil]     sob    Social History   Social History  . Marital Status: Single    Spouse Name: N/A  . Number of Children: N/A  . Years of Education: N/A   Occupational History  .      Tools   Social History Main Topics  . Smoking status: Never Smoker   . Smokeless tobacco: Never Used  . Alcohol Use: No  . Drug Use: No  . Sexual Activity: Not on file   Other Topics Concern  . Not on file   Social History Narrative     Family History  Problem  Relation Age of Onset  . Heart disease Other   . Pulmonary embolism Neg Hx   . CAD Maternal Grandfather     MI in his 71s maybe?  . Stroke Paternal Grandfather   . CAD Maternal Aunt     bypass at 44     Review of Systems: No history of bleeding issues in his lifetime. All other systems reviewed and are otherwise negative except as noted above.  Labs:  Recent Labs  05/01/15 0215 05/01/15 0710  TROPONINI <0.03 0.03   Lab Results  Component Value Date   WBC 9.3 05/01/2015   HGB 14.6 05/01/2015   HCT 45.1 05/01/2015   MCV 92.2 05/01/2015   PLT 168 05/01/2015    Recent Labs Lab 05/01/15 0215  NA 135  K 4.4  CL 104  CO2 23  BUN 20  CREATININE 1.26*  CALCIUM 8.6*  PROT 5.7*  BILITOT 1.7*  ALKPHOS 87  ALT 225*  AST 90*  GLUCOSE 128*   Lab Results  Component Value Date   CHOL 204* 04/18/2008   HDL 50 04/18/2008   LDLCALC 122* 04/18/2008   TRIG 158* 04/18/2008   No results found for: DDIMER  Radiology/Studies:  Ct Abdomen Pelvis W Contrast  04/30/2015   CLINICAL  DATA:  Abnormal labs. Elevated AST and ALT. Right upper quadrant mass. Nausea and vomiting for several weeks.  EXAM: CT ABDOMEN AND PELVIS WITH CONTRAST  TECHNIQUE: Multidetector CT imaging of the abdomen and pelvis was performed using the standard protocol following bolus administration of intravenous contrast.  CONTRAST:  100 mL Omnipaque 300 IV  COMPARISON:  None.  FINDINGS: Lower chest: Multi chamber cardiomegaly. Small right effusion. Adjacent ground-glass opacities in the right lower lobe. There is an apparent filling defect within the right lower lobe pulmonary artery. Minimal linear atelectasis in the lingula.  Liver: Contrast refluxing into the IVC and hepatic veins. Liver appears normal in size. No evidence of focal hepatic lesion. No definite morphologic findings of cirrhosis. There is a small amount perihepatic ascites.  Hepatobiliary: Gallbladder physiologically distended, questionable gallbladder wall thickening. No calcified gallstone. No biliary dilatation, common bile duct measures 6 mm at the porta hepatis.  Pancreas: Fatty atrophy of the head and uncinate process. No focal lesion. No ductal dilatation.  Spleen: Normal in size without focal lesion.  Adrenal glands: No nodule.  Kidneys: Symmetric renal enhancement. No hydronephrosis. No focal lesion.  Stomach/Bowel: Stomach physiologically distended. There are no dilated or thickened small bowel loops. Minimal thickening involving the ascending colon. Small volume of colonic stool. Distal diverticulosis without diverticulitis. The appendix is normal.  Vascular/Lymphatic: No retroperitoneal adenopathy. Abdominal aorta is normal in caliber. Moderate atherosclerosis of the abdominal aorta and its branches. No mesenteric or pelvic adenopathy.  Reproductive: Prostate gland is normal in size.  Bladder: Physiologically distended. Equivocal bladder wall thickening.  Other: Small amount of intra-abdominal ascites primarily in the perihepatic space, tracking  in the right pericolic gutter and in the pelvis. No free air or loculated intra-abdominal fluid collection. Small fat containing umbilical hernia. There is mild whole body wall and mesenteric edema.  Musculoskeletal: There are no acute or suspicious osseous abnormalities. Degenerative change in the lumbar spine and at the pubic symphysis.  IMPRESSION: 1. No evidence of focal right upper quadrant or intra-abdominal mass. 2. Small right pleural effusion, small amount of intra-abdominal and pelvic ascites, and whole body wall edema. Findings may be related to liver dysfunction, however there are no CT morphologic findings of cirrhosis.  There is multi chamber cardiomegaly, and right heart failure could produce a similar appearance. Suggestion of gallbladder wall thickening is likely related to systemic process. No biliary obstruction. 3. Incidental note of pulmonary embolus in a segmental right lower lobe pulmonary artery. 4. Colonic diverticulosis without diverticulitis and atherosclerosis. Critical Value/emergent results were called by telephone at the time of interpretation on 04/30/2015 at 11:09 pm to PA JEFFREY HEDGES , who verbally acknowledged these results.   Electronically Signed   By: Rubye Oaks M.D.   On: 04/30/2015 23:10   Dg Chest Port 1 View  05/01/2015   CLINICAL DATA:  Shortness of breath.  EXAM: PORTABLE CHEST - 1 VIEW  COMPARISON:  CT abdomen/pelvis 1 day prior.  FINDINGS: The heart is enlarged. Mild vascular congestion without overt edema. Minimal atelectasis at the left lung base. No confluent airspace disease. The small right pleural effusion on prior CT is not seen on this portable AP view. No pneumothorax. No acute osseous abnormality.  IMPRESSION: Cardiomegaly with mild vascular congestion.   Electronically Signed   By: Rubye Oaks M.D.   On: 05/01/2015 02:46    Wt Readings from Last 3 Encounters:  05/01/15 156 lb 1.6 oz (70.806 kg)  08/12/14 169 lb (76.658 kg)  04/01/14 168 lb  (76.204 kg)   EKG: NSR 98bpm, possible LAE, left axis deviation, possible prior inferior and anterior infarct, nonspecific TW changes, occasional PVC  Physical Exam: Blood pressure 113/81, pulse 82, temperature 97.8 F (36.6 C), temperature source Oral, resp. rate 20, height 5\' 8"  (1.727 m), weight 156 lb 1.6 oz (70.806 kg), SpO2 100 %. General: Well developed, well nourished WM in no acute distress. Head: Normocephalic, atraumatic, sclera non-icteric, no xanthomas, nares are without discharge.  Neck: Negative for carotid bruits. JVD not elevated. Lungs: Coarse at bases, otherwise no wheezes, rales, or rhonchi. Breathing is unlabored. Heart: Reg rhythm with slightly elevated rate, with S1 S2. No murmurs, rubs, or gallops appreciated. Abdomen: Soft, non-tender, non-distended with normoactive bowel sounds. No hepatomegaly. No rebound/guarding. No obvious abdominal masses. Msk:  Strength and tone appear normal for age. Extremities: No clubbing or cyanosis. Trace bilateral LE edema.  Distal pedal pulses are 2+ and equal bilaterally. Neuro: Alert and oriented X 3. No facial asymmetry. No focal deficit. Moves all extremities spontaneously. Psych:  Responds to questions appropriately with a normal affect.   TTE: 05/01/2015 - Left ventricle: Possible laminated echodense area on inferior wall, cannot exclude laminated thrombus. The cavity size was moderately dilated. Systolic function was normal. The estimated ejection fraction was in the range of 10% to 15%. Severe diffuse hypokinesis. Although no diagnostic regional wall motion abnormality was identified, this possibility cannot be completely excluded on the basis of this study. Doppler parameters are consistent with a reversible restrictive pattern, indicative of decreased left ventricular diastolic compliance and/or increased left atrial pressure (grade 3 diastolic dysfunction). - Mitral valve: There was severe  regurgitation. - Left atrium: The atrium was severely dilated. - Right ventricle: The cavity size was dilated. Wall thickness was normal. - Right atrium: The atrium was moderately dilated.   Assessment and Plan:   1. Acute combined congestive heart failure (EF 10-15%, grade 3 DD) - etiology not totally clear - has minimal risk factors for CAD except age and family history, denies recent viral hx/tobacco/alcohol/drugs - check UDS, HIV, & lipids in AM - given concomitant NSVT, we have recommended heart cath for definitive rule out of ischemic cause for his LV dysfunction - Risks and  benefits of cardiac catheterization have been discussed with the patient.  These include bleeding, infection, kidney damage, stroke, heart attack, death.  The patient understands these risks and is willing to proceed. Will not do RHC given + PE. Does not need LV gram - given ongoing diuresis we can defer this to reduce risk of CIN. Also note to cathing MD - patient is aspirin allergic and will require anticoag for PE. - will hold Lasix after this evening's dose so we can make sure Cr is OK for cath in AM - agree with holding Adderall - per d/w MD, add carvedilol 3.125mg  BID. Hold of ACEI/ARB pending stability of Cr post-diuresis/cath - dietician consult, CHF book, reviewed salt/fluid restriction with patient  2. Severe mitral regurgitation - further eval for this will depend on cath findings - may need TEE  3. Incidentally noted segmental RLL PE - will need LHC first before changing to oral anticoagulation  4. Possible LV thrombus - will be anticoagulated for the above anyway already  5. NSVT - add low dose MagOx (don't want to be too aggressive given his nausea) and follow Mg levels - add beta blocker - will need LifeVest this admission - we will initiate workup  6. Unspecified renal insufficiency, unclear if AKI or mild CKD - follow  7. Elevated LFTs - ? Hepatic congestion - hepatitis panel  pending  8. Asthma - not actively wheezing. If he does not tolerate Coreg, would switch to Toprol.  SignedRonie Spies PA-C 05/01/2015, 1:41 PM Pager: [phone]  The patient was seen, examined and discussed with Ronie Spies, PA-C and I agree with the above.   A very pleasant 59 year old male with new dg of acute combined systolic and diastolic CHF, with severely impaired LVEF 10-15% and restrictive pattern of diastolic dysfunction. He presented with nausea and elevated LFTs.  The etiology is unknown, we will schedule him for a left cardiac cath. The fact that his both ventricles are dilated, that there is grade 3 DD, severely dilated left atrium is highly suggestive of a long standing process. The patient admits to a recent viral infection. If his cath is normal we will schedule a cardiac MRI to further evaluate.  There is high suspicious for LV thrombus, he is on anticoagulation for subsegmental pulmonary embolism - therefore we will avoid right sided cath. He will require at least of 6 months of anticoagulation. Currently on iv Heparin. There are several long nsVTs on telemetry up to 20 beats. We will start a low dose carvedilol, replace magnesium. We will request Lifevest. For now continue iv Lasix, add spironolactone post cath, ACEI/ARB once his Crea is improved, now 1.26. LFTs are slightly improved.  Lars Masson 05/01/2015

## 2015-05-02 ENCOUNTER — Encounter (HOSPITAL_COMMUNITY)
Admission: EM | Disposition: A | Discharge status: Home Health | Payer: BLUE CROSS/BLUE SHIELD | Source: Home / Self Care | Attending: Internal Medicine

## 2015-05-02 DIAGNOSIS — I340 Nonrheumatic mitral (valve) insufficiency: Secondary | ICD-10-CM

## 2015-05-02 DIAGNOSIS — N179 Acute kidney failure, unspecified: Secondary | ICD-10-CM | POA: Diagnosis present

## 2015-05-02 DIAGNOSIS — I429 Cardiomyopathy, unspecified: Secondary | ICD-10-CM

## 2015-05-02 DIAGNOSIS — I5021 Acute systolic (congestive) heart failure: Secondary | ICD-10-CM

## 2015-05-02 DIAGNOSIS — R0602 Shortness of breath: Secondary | ICD-10-CM | POA: Diagnosis present

## 2015-05-02 HISTORY — PX: CARDIAC CATHETERIZATION: SHX172

## 2015-05-02 LAB — LIPID PANEL
CHOLESTEROL: 95 mg/dL (ref 0–200)
HDL: 30 mg/dL — ABNORMAL LOW (ref >40)
LDL Cholesterol: 56 mg/dL (ref 0–99)
TRIGLYCERIDES: 44 mg/dL (ref <150)
Total CHOL/HDL Ratio: 3.2 RATIO
VLDL: 9 mg/dL (ref 0–40)

## 2015-05-02 LAB — HEPATIC FUNCTION PANEL
ALBUMIN: 3.3 g/dL — AB (ref 3.5–5.0)
ALT: 161 U/L — AB (ref 17–63)
AST: 50 U/L — AB (ref 15–41)
Alkaline Phosphatase: 88 U/L (ref 38–126)
BILIRUBIN DIRECT: 0.5 mg/dL (ref 0.1–0.5)
BILIRUBIN TOTAL: 1.7 mg/dL — AB (ref 0.3–1.2)
Indirect Bilirubin: 1.2 mg/dL — ABNORMAL HIGH (ref 0.3–0.9)
Total Protein: 5.4 g/dL — ABNORMAL LOW (ref 6.5–8.1)

## 2015-05-02 LAB — CBC
HCT: 43.5 % (ref 39.0–52.0)
HEMOGLOBIN: 14.6 g/dL (ref 13.0–17.0)
MCH: 30.5 pg (ref 26.0–34.0)
MCHC: 33.6 g/dL (ref 30.0–36.0)
MCV: 91 fL (ref 78.0–100.0)
Platelets: 154 10*3/uL (ref 150–400)
RBC: 4.78 MIL/uL (ref 4.22–5.81)
RDW: 15.3 % (ref 11.5–15.5)
WBC: 9.4 10*3/uL (ref 4.0–10.5)

## 2015-05-02 LAB — MAGNESIUM: Magnesium: 2 mg/dL (ref 1.7–2.4)

## 2015-05-02 LAB — HEPATITIS PANEL, ACUTE
HEP A IGM: NEGATIVE
Hep B C IgM: NEGATIVE
Hepatitis B Surface Ag: NEGATIVE

## 2015-05-02 LAB — BASIC METABOLIC PANEL
ANION GAP: 9 (ref 5–15)
BUN: 23 mg/dL — ABNORMAL HIGH (ref 6–20)
CO2: 26 mmol/L (ref 22–32)
Calcium: 8.3 mg/dL — ABNORMAL LOW (ref 8.9–10.3)
Chloride: 100 mmol/L — ABNORMAL LOW (ref 101–111)
Creatinine, Ser: 1.26 mg/dL — ABNORMAL HIGH (ref 0.61–1.24)
GLUCOSE: 109 mg/dL — AB (ref 65–99)
POTASSIUM: 3.9 mmol/L (ref 3.5–5.1)
Sodium: 135 mmol/L (ref 135–145)

## 2015-05-02 LAB — HIV ANTIBODY (ROUTINE TESTING W REFLEX): HIV Screen 4th Generation wRfx: NONREACTIVE

## 2015-05-02 LAB — HEPARIN LEVEL (UNFRACTIONATED): Heparin Unfractionated: 0.69 IU/mL (ref 0.30–0.70)

## 2015-05-02 SURGERY — LEFT HEART CATH AND CORONARY ANGIOGRAPHY
Anesthesia: LOCAL

## 2015-05-02 MED ORDER — ONDANSETRON HCL 4 MG/2ML IJ SOLN: 4.0000 mg | Freq: Four times a day (QID) | INTRAMUSCULAR | Status: DC | PRN

## 2015-05-02 MED ORDER — SODIUM CHLORIDE 0.9 % IV SOLN: INTRAVENOUS | Status: DC

## 2015-05-02 MED ORDER — LIDOCAINE HCL (PF) 1 % IJ SOLN
INTRAMUSCULAR | Status: AC
  Filled 2015-05-02: qty 30

## 2015-05-02 MED ORDER — MIDAZOLAM HCL 2 MG/2ML IJ SOLN
INTRAMUSCULAR | Status: DC | PRN
  Administered 2015-05-02: 0.5 mg via INTRAVENOUS

## 2015-05-02 MED ORDER — HEPARIN (PORCINE) IN NACL 2-0.9 UNIT/ML-% IJ SOLN
INTRAMUSCULAR | Status: AC
  Filled 2015-05-02: qty 1000

## 2015-05-02 MED ORDER — HEPARIN (PORCINE) IN NACL 100-0.45 UNIT/ML-% IJ SOLN
1400.0000 [IU]/h | INTRAMUSCULAR | Status: AC
  Administered 2015-05-02 – 2015-05-04 (×3): 1400 [IU]/h via INTRAVENOUS
  Filled 2015-05-02 (×2): qty 250

## 2015-05-02 MED ORDER — LEVALBUTEROL HCL 0.63 MG/3ML IN NEBU
0.6300 mg | INHALATION_SOLUTION | Freq: Three times a day (TID) | RESPIRATORY_TRACT | Status: DC | PRN
  Administered 2015-05-02: 0.63 mg via RESPIRATORY_TRACT
  Filled 2015-05-02 (×3): qty 3

## 2015-05-02 MED ORDER — HEPARIN (PORCINE) IN NACL 2-0.9 UNIT/ML-% IJ SOLN
INTRAMUSCULAR | Status: DC | PRN
  Administered 2015-05-02: 14:00:00

## 2015-05-02 MED ORDER — FENTANYL CITRATE (PF) 100 MCG/2ML IJ SOLN
INTRAMUSCULAR | Status: DC | PRN
  Administered 2015-05-02 (×2): 12.5 ug via INTRAVENOUS

## 2015-05-02 MED ORDER — FENTANYL CITRATE (PF) 100 MCG/2ML IJ SOLN
INTRAMUSCULAR | Status: AC
  Filled 2015-05-02: qty 4

## 2015-05-02 MED ORDER — SODIUM CHLORIDE 0.9 % WEIGHT BASED INFUSION: 1.0000 mL/kg/h | INTRAVENOUS | Status: AC

## 2015-05-02 MED ORDER — IOHEXOL 350 MG/ML SOLN
INTRAVENOUS | Status: DC | PRN
  Administered 2015-05-02: 30 mL via INTRACARDIAC

## 2015-05-02 MED ORDER — SODIUM CHLORIDE 0.9 % IV SOLN: 250.0000 mL | INTRAVENOUS | Status: DC | PRN

## 2015-05-02 MED ORDER — MIDAZOLAM HCL 2 MG/2ML IJ SOLN
INTRAMUSCULAR | Status: AC
  Filled 2015-05-02: qty 4

## 2015-05-02 MED ORDER — OXYCODONE-ACETAMINOPHEN 5-325 MG PO TABS
1.0000 | ORAL_TABLET | ORAL | Status: DC | PRN
  Filled 2015-05-02: qty 2

## 2015-05-02 MED ORDER — VERAPAMIL HCL 2.5 MG/ML IV SOLN
INTRAVENOUS | Status: DC | PRN
  Administered 2015-05-02: 14:00:00 via INTRA_ARTERIAL

## 2015-05-02 MED ORDER — SODIUM CHLORIDE 0.9 % IJ SOLN: 3.0000 mL | INTRAMUSCULAR | Status: DC | PRN

## 2015-05-02 MED ORDER — SODIUM CHLORIDE 0.9 % IJ SOLN
3.0000 mL | Freq: Two times a day (BID) | INTRAMUSCULAR | Status: DC
  Administered 2015-05-02 – 2015-05-09 (×9): 3 mL via INTRAVENOUS

## 2015-05-02 MED ORDER — HEPARIN SODIUM (PORCINE) 1000 UNIT/ML IJ SOLN
INTRAMUSCULAR | Status: DC | PRN
  Administered 2015-05-02: 3500 [IU] via INTRAVENOUS

## 2015-05-02 MED ORDER — NITROGLYCERIN 1 MG/10 ML FOR IR/CATH LAB
INTRA_ARTERIAL | Status: AC
  Filled 2015-05-02: qty 10

## 2015-05-02 MED ORDER — SODIUM CHLORIDE 0.9 % IV SOLN
INTRAVENOUS | Status: DC
  Administered 2015-05-02: 04:00:00 via INTRAVENOUS

## 2015-05-02 MED ORDER — VERAPAMIL HCL 2.5 MG/ML IV SOLN
INTRAVENOUS | Status: AC
  Filled 2015-05-02: qty 2

## 2015-05-02 SURGICAL SUPPLY — 9 items
CATH INFINITI 5 FR JL3.5 (CATHETERS) ×2 IMPLANT
CATH INFINITI JR4 5F (CATHETERS) ×2 IMPLANT
DEVICE RAD COMP TR BAND LRG (VASCULAR PRODUCTS) ×2 IMPLANT
GLIDESHEATH SLEND A-KIT 6F 22G (SHEATH) ×2 IMPLANT
KIT HEART LEFT (KITS) ×2 IMPLANT
PACK CARDIAC CATHETERIZATION (CUSTOM PROCEDURE TRAY) ×2 IMPLANT
TRANSDUCER W/STOPCOCK (MISCELLANEOUS) ×2 IMPLANT
TUBING CIL FLEX 10 FLL-RA (TUBING) ×2 IMPLANT
WIRE SAFE-T 1.5MM-J .035X260CM (WIRE) ×2 IMPLANT

## 2015-05-02 NOTE — Progress Notes (Signed)
Patient: Marcus Bates / Admit Date: 04/30/2015 / Date of Encounter: 05/02/2015, 8:03 AM   Subjective: Had the best night's sleep he's had in a while. He spoke with his sister and father last night and added them to his emergency contacts. No CP or SOB. "I feel decent."    Objective: Telemetry: NSR/ST occasional PACs, PVCs, occasional NSVT (up to 18 beats over the last 24 hours), one brief run of PAT and one longer run of what appears to be SVT Physical Exam: Blood pressure 118/50, pulse 102, temperature 98.5 F (36.9 C), temperature source Oral, resp. rate 20, height  (1.727 m), weight 149 lb 4.8 oz (67.722 kg), SpO2 93 %. General: Well developed, well nourished WM in no acute distress. Head: Normocephalic, atraumatic, sclera non-icteric, no xanthomas, nares are without discharge. Neck: Negative for carotid bruits. JVP not elevated. Lungs: Crackles at right lung base. No wheezes, rales, or rhonchi. Breathing is unlabored. Heart: RRR S1 S2 without murmurs, rubs, or gallops.  Abdomen: Soft, non-tender, non-distended with normoactive bowel sounds. No rebound/guarding. Extremities: No clubbing or cyanosis. No edema. Distal pedal pulses are 2+ and equal bilaterally. Neuro: Alert and oriented X 3. Moves all extremities spontaneously. Psych:  Responds to questions appropriately with a normal affect.   Intake/Output Summary (Last 24 hours) at 05/02/15 0803 Last data filed at 05/02/15 0302  Gross per 24 hour  Intake 535.58 ml  Output   2175 ml  Net -1639.42 ml    Inpatient Medications:  . carvedilol  3.125 mg Oral BID WC  . clonazePAM  1 mg Oral QHS  . gabapentin  300 mg Oral QHS  . Living Better with Heart Failure Book   Does not apply Once  . magnesium oxide  200 mg Oral Daily  . mometasone-formoterol  2 puff Inhalation BID  . sodium chloride  3 mL Intravenous Q12H   Infusions:  . sodium chloride 10 mL/hr at 05/02/15 0426  . heparin 1,400 Units/hr (05/02/15 0743)     Labs:  Recent Labs  05/01/15 0215 05/01/15 1255 05/02/15 0512  NA 135  --  135  K 4.4  --  3.9  CL 104  --  100*  CO2 23  --  26  GLUCOSE 128*  --  109*  BUN 20  --  23*  CREATININE 1.26*  --  1.26*  CALCIUM 8.6*  --  8.3*  MG  --  1.9 2.0    Recent Labs  05/01/15 0215 05/02/15 0512  AST 90* 50*  ALT 225* 161*  ALKPHOS 87 88  BILITOT 1.7* 1.7*  PROT 5.7* 5.4*  ALBUMIN 3.4* 3.3*    Recent Labs  05/01/15 0215 05/02/15 0512  WBC 9.3 9.4  NEUTROABS 7.3  --   HGB 14.6 14.6  HCT 45.1 43.5  MCV 92.2 91.0  PLT 168 154    Recent Labs  05/01/15 0215 05/01/15 0710 05/01/15 1255  TROPONINI <0.03 0.03 0.03   Invalid input(s): POCBNP No results for input(s): HGBA1C in the last 72 hours.   Radiology/Studies:  Ct Abdomen Pelvis W Contrast  04/30/2015   CLINICAL DATA:  Abnormal labs. Elevated AST and ALT. Right upper quadrant mass. Nausea and vomiting for several weeks.  EXAM: CT ABDOMEN AND PELVIS WITH CONTRAST  TECHNIQUE: Multidetector CT imaging of the abdomen and pelvis was performed using the standard protocol following bolus administration of intravenous contrast.  CONTRAST:  100 mL Omnipaque 300 IV  COMPARISON:  None.  FINDINGS: Lower chest: Multi chamber  cardiomegaly. Small right effusion. Adjacent ground-glass opacities in the right lower lobe. There is an apparent filling defect within the right lower lobe pulmonary artery. Minimal linear atelectasis in the lingula.  Liver: Contrast refluxing into the IVC and hepatic veins. Liver appears normal in size. No evidence of focal hepatic lesion. No definite morphologic findings of cirrhosis. There is a small amount perihepatic ascites.  Hepatobiliary: Gallbladder physiologically distended, questionable gallbladder wall thickening. No calcified gallstone. No biliary dilatation, common bile duct measures 6 mm at the porta hepatis.  Pancreas: Fatty atrophy of the head and uncinate process. No focal lesion. No ductal  dilatation.  Spleen: Normal in size without focal lesion.  Adrenal glands: No nodule.  Kidneys: Symmetric renal enhancement. No hydronephrosis. No focal lesion.  Stomach/Bowel: Stomach physiologically distended. There are no dilated or thickened small bowel loops. Minimal thickening involving the ascending colon. Small volume of colonic stool. Distal diverticulosis without diverticulitis. The appendix is normal.  Vascular/Lymphatic: No retroperitoneal adenopathy. Abdominal aorta is normal in caliber. Moderate atherosclerosis of the abdominal aorta and its branches. No mesenteric or pelvic adenopathy.  Reproductive: Prostate gland is normal in size.  Bladder: Physiologically distended. Equivocal bladder wall thickening.  Other: Small amount of intra-abdominal ascites primarily in the perihepatic space, tracking in the right pericolic gutter and in the pelvis. No free air or loculated intra-abdominal fluid collection. Small fat containing umbilical hernia. There is mild whole body wall and mesenteric edema.  Musculoskeletal: There are no acute or suspicious osseous abnormalities. Degenerative change in the lumbar spine and at the pubic symphysis.  IMPRESSION: 1. No evidence of focal right upper quadrant or intra-abdominal mass. 2. Small right pleural effusion, small amount of intra-abdominal and pelvic ascites, and whole body wall edema. Findings may be related to liver dysfunction, however there are no CT morphologic findings of cirrhosis. There is multi chamber cardiomegaly, and right heart failure could produce a similar appearance. Suggestion of gallbladder wall thickening is likely related to systemic process. No biliary obstruction. 3. Incidental note of pulmonary embolus in a segmental right lower lobe pulmonary artery. 4. Colonic diverticulosis without diverticulitis and atherosclerosis. Critical Value/emergent results were called by telephone at the time of interpretation on 04/30/2015 at 11:09 pm to PA  JEFFREY HEDGES , who verbally acknowledged these results.   Electronically Signed   By: Rubye Oaks M.D.   On: 04/30/2015 23:10   Dg Chest Port 1 View  05/01/2015   CLINICAL DATA:  Shortness of breath.  EXAM: PORTABLE CHEST - 1 VIEW  COMPARISON:  CT abdomen/pelvis 1 day prior.  FINDINGS: The heart is enlarged. Mild vascular congestion without overt edema. Minimal atelectasis at the left lung base. No confluent airspace disease. The small right pleural effusion on prior CT is not seen on this portable AP view. No pneumothorax. No acute osseous abnormality.  IMPRESSION: Cardiomegaly with mild vascular congestion.   Electronically Signed   By: Rubye Oaks M.D.   On: 05/01/2015 02:46     Assessment and Plan  1. Acute combined congestive heart failure (EF 10-15%, grade 3 DD) - etiology not totally clear - has minimal risk factors for CAD except age and family history, denies recent viral hx/tobacco/alcohol/drugs - given concomitant NSVT, we have recommended heart cath for definitive rule out of ischemic cause for his LV dysfunction - Risks and benefits of cardiac catheterization have been discussed with the patient. These include bleeding, infection, kidney damage, stroke, heart attack, death. The patient understands these risks and is willing to  proceed. Will not do RHC given + PE/DVT. Does not need LV gram - given ongoing diuresis we can defer this to reduce risk of CIN. Also note to cathing MD - patient is aspirin allergic and will require anticoag for PE. - agree with holding Adderall - hold of ACEI/ARB and further Lasix pending stability of Cr post-diuresis/cath - Coreg added yesterday, has received 1 dose thus far - follow BP with addition - dietician consult, CHF book, reviewed salt/fluid restriction with patient  2. Severe mitral regurgitation - further eval for this will depend on cath findings - may need TEE  3. Incidentally noted segmental RLL PE and subsequent acute right DVT,  and superficial thrombus in the left - will need LHC first before changing to oral anticoagulation  4. Possible LV thrombus - will be anticoagulated for the above anyway already  5. NSVT (longest episodes are 18 beats and 20 beats so far) - low dose Mg added - continue BB - will review telemetry with Dr. Delton See given different appearing, longer tachycardia - ?SVT vs VT. Strips printed out and placed in chart. Although the patient denied any episodes of passing out yesterday, today he recalls that he has had one episode of syncope last year while sitting, and over the last year has had episodes of pre-syncope that occur without warning - described as a feeling that all the blood is leaving his head. Before proceeding with LifeVest will consult EP for formal recs. Will keep at [facility] after cath for this.  6. Unspecified renal insufficiency, unclear if AKI or mild CKD - follow  7. Elevated LFTs/nausea - ? Hepatic congestion - improving, hep panel neg  8. Asthma - not actively wheezing. If he does not tolerate Coreg, would switch to Toprol.  Signed, Ronie Spies PA-C Pager: [phone]  The patient was seen, examined and discussed with Ronie Spies, PA-C and I agree with the above.   A very pleasant 59 year old male with new dg of acute combined systolic and diastolic CHF, with severely impaired LVEF 10-15% and restrictive pattern of diastolic dysfunction. He presented with nausea and elevated LFTs.  The etiology is unknown, he is scheduled for a  left cardiac cath today. The fact that his both ventricles are dilated, that there is grade 3 DD, severely dilated left atrium is highly suggestive of a long standing process. The patient admits to a recent viral infection. If his cath is normal we will schedule a cardiac MRI to further evaluate.  There is high suspicious for LV thrombus, he is on anticoagulation for subsegmental pulmonary embolism - therefore we will avoid right sided cath. He will  require at least of 6 months of anticoagulation. Currently on iv Heparin. There are several long nsVTs on telemetry up to 20 beats. He continued to have long runs of nsVT overnight, syncope and presyncope in the past. We started a low dose carvedilol, replace magnesium. We will request Lifevest. He feels better today, improved SOB. Diuresed 1.6 L overnight. For now continue iv Lasix, add spironolactone post cath, ACEI/ARB once his Crea is improved, now 1.26. LFTs are improving.  Lars Masson 05/02/2015

## 2015-05-02 NOTE — Progress Notes (Signed)
Pt c/o numbness over L forearm after the procedure. The numbness was circumferential. There was no weakness or loss of movement.   Checked pt and no focal deficits. The numbness is gradually improving. He has good circulation to that hand and no edema. His IV site on L looks normal.   He has DVT and is to be anticoagulated 8 hr after sheath removal.   Continue current therapy, no further evaluation needed at this time.

## 2015-05-02 NOTE — Progress Notes (Signed)
ANTICOAGULATION CONSULT NOTE  Pharmacy Consult for IV Heparin Indication: pulmonary embolus/DVT,  Resume 8 hrs s/p sheath removal s/p cardiac cath 8/26   Allergies  Allergen Reactions  . Aspirin     REACTION: facial swelling  . Dust Mite Extract   . Other     Cats   . Peanuts [Peanut Oil]     sob    Patient Measurements: Height: 5' 8.5" (174 cm) Weight: 157 lb (71.215 kg) IBW/kg (Calculated) : 69.55  Vital Signs: Temp: 97.9 F (36.6 C) (08/26 1426) Temp Source: Oral (08/26 1426) BP: 88/45 mmHg (08/26 1426) Pulse Rate: 83 (08/26 1426)  Labs:  Recent Labs  04/30/15 1733 04/30/15 2146 05/01/15 0215  05/01/15 0710 05/01/15 1255 05/01/15 2108 05/02/15 0512  HGB 16.1  --  14.6  --   --   --   --  14.6  HCT 48.5  --  45.1  --   --   --   --  43.5  PLT 200  --  168  --   --   --   --  154  APTT  --  30  --   --   --   --   --   --   LABPROT  --  16.7*  --   --   --   --   --   --   INR  --  1.34  --   --   --   --   --   --   HEPARINUNFRC  --   --   --   < > 0.34 0.15* 0.62 0.69  CREATININE 1.27*  --  1.26*  --   --   --   --  1.26*  TROPONINI  --   --  <0.03  --  0.03 0.03  --   --   < > = values in this interval not displayed.  Estimated Creatinine Clearance: 62.1 mL/min (by C-G formula based on Cr of 1.26).  Assessment: 23 yoM who was referred to ED with abnormal lab work (elevated LFTs).  While there he had complaints of LE swelling, SOB, and nausea.  CT of abdomen shows incidental finding of RLL PE; Dopplers + R DVT; concern for LV thrombus.   IV Heparin per Rx initiated pending cardiology work-up for acute onset HF. AM heparin level was 0.69 on 1450 units/hr and rate was decreased to 1400 units/hr.  Now he is s/p cardiac cath and heparin is to be resumed 8 hrs after sheath removed.   Goal of Therapy:  Heparin level 0.3-0.7 units/ml Monitor platelets by anticoagulation protocol: Yes   Plan:   Resume heparin infusion at 1400 units/hr 8 hrs s/p sheath pull at  2345 and check HL in 6 hours  Daily CBC/HL while on heparin  F/U plans for long-term anticoagulation after cath  Herby Abraham, Pharm.D. [phone] 05/02/2015 3:34 PM

## 2015-05-02 NOTE — Progress Notes (Signed)
ANTICOAGULATION CONSULT NOTE  Pharmacy Consult for IV Heparin Indication: pulmonary embolus/DVT  Allergies  Allergen Reactions  . Aspirin     REACTION: facial swelling  . Dust Mite Extract   . Other     Cats   . Peanuts [Peanut Oil]     sob    Patient Measurements: Height: 5\' 8"  (172.7 cm) Weight: 149 lb 4.8 oz (67.722 kg) IBW/kg (Calculated) : 68.4  Vital Signs: Temp: 98.5 F (36.9 C) (08/26 0623) Temp Source: Oral (08/26 0623) BP: 118/50 mmHg (08/26 0623) Pulse Rate: 102 (08/26 0253)  Labs:  Recent Labs  04/30/15 1733 04/30/15 2146 05/01/15 0215  05/01/15 0710 05/01/15 1255 05/01/15 2108 05/02/15 0512  HGB 16.1  --  14.6  --   --   --   --  14.6  HCT 48.5  --  45.1  --   --   --   --  43.5  PLT 200  --  168  --   --   --   --  154  APTT  --  30  --   --   --   --   --   --   LABPROT  --  16.7*  --   --   --   --   --   --   INR  --  1.34  --   --   --   --   --   --   HEPARINUNFRC  --   --   --   < > 0.34 0.15* 0.62 0.69  CREATININE 1.27*  --  1.26*  --   --   --   --  1.26*  TROPONINI  --   --  <0.03  --  0.03 0.03  --   --   < > = values in this interval not displayed.  Estimated Creatinine Clearance: 60.4 mL/min (by C-G formula based on Cr of 1.26).   Medical History: Past Medical History  Diagnosis Date  . Asthma   . Anxiety   . Depression   . Insomnia   . Restless leg syndrome     Medications:  Prescriptions prior to admission  Medication Sig Dispense Refill Last Dose  . acetaminophen (TYLENOL) 500 MG tablet Take 500 mg by mouth every 6 (six) hours as needed (calm nerves).   04/29/2015 at Unknown time  . ADVAIR DISKUS 250-50 MCG/DOSE AEPB TAKE 1 PUFF BY MOUTH TWICE A DAY  5 04/29/2015 at Unknown time  . albuterol (PROVENTIL HFA;VENTOLIN HFA) 108 (90 BASE) MCG/ACT inhaler Inhale 2 puffs into the lungs every 4 (four) hours as needed for wheezing or shortness of breath. 1 Inhaler 2 04/29/2015 at Unknown time  . amphetamine-dextroamphetamine  (ADDERALL) 10 MG tablet TAKE 1 TABLET IN THE MORNING TWICE A DAY  0 Past Month at Unknown time  . clonazePAM (KLONOPIN) 0.5 MG tablet TAKE 1 TABLET BY MOUTH EVERY EVENING AS DIRECTED (Patient taking differently: TAKE 1 TABLET BY MOUTH EVERY EVENING AS NEEDED FOR ANXIETY) 30 tablet 0 Past Week at Unknown time  . gabapentin (NEURONTIN) 600 MG tablet Take 300 mg by mouth at bedtime.    04/29/2015 at Unknown time  . methylphenidate (METADATE ER) 20 MG ER tablet Take 1 tablet (20 mg total) by mouth daily. (Patient not taking: Reported on 04/30/2015) 30 tablet 0 Not Taking at Unknown time  . mupirocin ointment (BACTROBAN) 2 % Apply 1 application topically 2 (two) times daily. (Patient not taking: Reported on 04/30/2015)  22 g 0 Not Taking at Unknown time  . predniSONE (DELTASONE) 50 MG tablet Take daily with breakfast (Patient not taking: Reported on 04/30/2015) 5 tablet 0 Not Taking at Unknown time   Scheduled:  . carvedilol  3.125 mg Oral BID WC  . clonazePAM  1 mg Oral QHS  . gabapentin  300 mg Oral QHS  . Living Better with Heart Failure Book   Does not apply Once  . magnesium oxide  200 mg Oral Daily  . mometasone-formoterol  2 puff Inhalation BID  . sodium chloride  3 mL Intravenous Q12H   Infusions:  . sodium chloride 10 mL/hr at 05/02/15 0426  . heparin 1,450 Units/hr (05/02/15 0426)    Assessment: 6 yoM who was referred to ED with abnormal lab work (elevated LFTs).  While there he had complaints of LE swelling, SOB, and nausea.  CT of abdomen shows incidental finding of RLL PE; Dopplers + R DVT; concern for LV thrombus.   IV Heparin per Rx initiated pending cardiology work-up for acute onset HF.  05/02/2015:  Heparin level therapeutic at upper end of goal range No bleeding reported Platelets trending down; Hg stable Plan for LHC today   Goal of Therapy:  Heparin level 0.3-0.7 units/ml Monitor platelets by anticoagulation protocol: Yes   Plan:   Decrease heparin infusion to 1400  units/hr  Daily CBC/HL while on heparin  Hold heparin on call to cardiac cath  F/U plans for long-term anticoagulation after cath  Junita Push, PharmD, BCPS Pager: [phone] 05/02/2015,7:28 AM

## 2015-05-02 NOTE — Progress Notes (Signed)
Patient reported numbness left arm wrist to axilla, circumference around arm, "feels like a feather is touching my arm." Neuro checks wnls. No weakness noted in upper or lower extremities. No numbness any where other than left arm. No pain reported. Able to blink eyes, smile symmetrical, tongue midline. Patient denies visual changes.

## 2015-05-02 NOTE — Plan of Care (Signed)
Problem: Food- and Nutrition-Related Knowledge Deficit (NB-1.1) Goal: Nutrition education Formal process to instruct or train a patient/client in a skill or to impart knowledge to help patients/clients voluntarily manage or modify food choices and eating behavior to maintain or improve health. Outcome: Progressing Nutrition Education Note  RD consulted for nutrition education regarding new onset CHF.  RD provided "Low Sodium Nutrition Therapy" handout from the [employer]. Reviewed patient's dietary recall. Provided examples on ways to decrease sodium intake in diet. Discouraged intake of processed foods and use of salt shaker. Encouraged fresh fruits and vegetables as well as whole grain sources of carbohydrates to maximize fiber intake.   RD discussed why it is important for patient to adhere to diet recommendations, and emphasized the role of fluids, foods to avoid, and importance of weighing self daily. Teach back method used.  Expect fair compliance.  Body mass index is 22.71 kg/(m^2). Pt meets criteria for normal weight based on current BMI.  Current diet order is NPO, patient is consuming approximately no meals at this time. Labs and medications reviewed. No further nutrition interventions warranted at this time. RD contact information provided. If additional nutrition issues arise, please re-consult RD.   Marcus Bates. Valmai Vandenberghe, MS, RD LDN After Hours/Weekend Pager [phone]

## 2015-05-02 NOTE — Progress Notes (Signed)
PROGRESS NOTE  Marcus Bates ZOX:[id number] DOB: Jul 04, 1956 DOA: 04/30/2015 PCP: Lolita Patella, MD   HPI: 59 yo M admitted on 8/25 with elevated LFTs and reports of increasing dyspnea with exertion for the past month as well as lower extremity swelling. On admission he underwent a CT abdomen and pelvis which showed anasarca as well as cardiomegaly, also an incidental PE in a segmental right lower lobe pulmonary artery. 2D echo showed a depressed EF 10-15%, grade 3 DD. Patient transferred to [facility] on 8/26 at Cardiology request.  Subjective / 24 H Interval events - slept well overnight - denies chest pain / palpitations / dyspnea this morning  Assessment/Plan: Principal Problem:   Acute combined systolic and diastolic congestive heart failure Active Problems:   Asthma   Pulmonary embolus   Cardiomegaly   Abnormal LFTs   PE (pulmonary embolism)   Severe mitral regurgitation   LV (left ventricular) mural thrombus   NSVT (nonsustained ventricular tachycardia)   Renal insufficiency   Acute combined systolic and diastolic heart failure - 2D echo with EF 10-15% and grade 3 diastolic dysfunction, with significant fluid overload on admission with JVD, pitting LE edema, crackles, improved with diuresis, weight 160 >> 156 >> 149. Renal function stable with diuresis - further workup per cardiology, plan for cardiac catheterization today and EP consult, patient will be transferred to [facility] stepdown, report given to my colleague Dr. Joseph Art. - possible LV thrombus on Echo, will be anticoagulated for PE/DVT - continue Coreg - off of Lasix today to prevent CIN with cath   NSVT / SVT - due to #1, EP consult - K 4.4 >> 3.9, Mg 2.0  PE / DVT  - ? Decreased activity level in the past 1-2 months, no other obvious provoking factors - continue anticoagulation, currently on heparin gtt given need for cath  AKI - his Cr has been stable with diuresis. Prior Cr of 0.93 in 2009, no records  since - closely monitor in the setting of diuresis and contrast exposure with cath. - hold Lasix today  Abnormal LFTs  - due to congestive hepatopathy due to heart failure - no evidence of cirrhosis on imaging - improving  Asthma - stable, no wheezing currently, continue Dulera, change Albuterol to Xopenex  Anxiety  - continue Clonopin PRN QHS   Diet: Diet NPO time specified Except for: Sips with Meds Fluids: none  DVT Prophylaxis: heparin gtt   Code Status: Full Code Family Communication: no family bedside  Disposition Plan: transfer to [facility] today   Consultants:  Cardiology  Procedures:  2D echo Study Conclusions - Left ventricle: Possible laminated echodense area on inferiorwall, cannot exclude laminated thrombus. The cavity size wasmoderately dilated. Systolic function was normal. The estimatedejection fraction was in the range of 10% to 15%. Severe diffusehypokinesis. Although no diagnostic regional wall motionabnormality was identified, this possibility cannot be completelyexcluded on the basis of this study. Doppler parameters areconsistent with a reversible restrictive pattern, indicative ofdecreased left ventricular diastolic compliance and/or increasedleft atrial pressure (grade 3 diastolic dysfunction). - Mitral valve: There was severe regurgitation. - Left atrium: The atrium was severely dilated. - Right ventricle: The cavity size was dilated. Wall thickness wasnormal. - Right atrium: The atrium was moderately dilated.   Antibiotics None   Studies  Ct Abdomen Pelvis W Contrast  04/30/2015   CLINICAL DATA:  Abnormal labs. Elevated AST and ALT. Right upper quadrant mass. Nausea and vomiting for several weeks.  EXAM: CT ABDOMEN AND PELVIS WITH CONTRAST  TECHNIQUE: Multidetector CT imaging of the abdomen and pelvis was performed using the standard protocol following bolus administration of intravenous contrast.  CONTRAST:  100 mL Omnipaque 300 IV   COMPARISON:  None.  FINDINGS: Lower chest: Multi chamber cardiomegaly. Small right effusion. Adjacent ground-glass opacities in the right lower lobe. There is an apparent filling defect within the right lower lobe pulmonary artery. Minimal linear atelectasis in the lingula.  Liver: Contrast refluxing into the IVC and hepatic veins. Liver appears normal in size. No evidence of focal hepatic lesion. No definite morphologic findings of cirrhosis. There is a small amount perihepatic ascites.  Hepatobiliary: Gallbladder physiologically distended, questionable gallbladder wall thickening. No calcified gallstone. No biliary dilatation, common bile duct measures 6 mm at the porta hepatis.  Pancreas: Fatty atrophy of the head and uncinate process. No focal lesion. No ductal dilatation.  Spleen: Normal in size without focal lesion.  Adrenal glands: No nodule.  Kidneys: Symmetric renal enhancement. No hydronephrosis. No focal lesion.  Stomach/Bowel: Stomach physiologically distended. There are no dilated or thickened small bowel loops. Minimal thickening involving the ascending colon. Small volume of colonic stool. Distal diverticulosis without diverticulitis. The appendix is normal.  Vascular/Lymphatic: No retroperitoneal adenopathy. Abdominal aorta is normal in caliber. Moderate atherosclerosis of the abdominal aorta and its branches. No mesenteric or pelvic adenopathy.  Reproductive: Prostate gland is normal in size.  Bladder: Physiologically distended. Equivocal bladder wall thickening.  Other: Small amount of intra-abdominal ascites primarily in the perihepatic space, tracking in the right pericolic gutter and in the pelvis. No free air or loculated intra-abdominal fluid collection. Small fat containing umbilical hernia. There is mild whole body wall and mesenteric edema.  Musculoskeletal: There are no acute or suspicious osseous abnormalities. Degenerative change in the lumbar spine and at the pubic symphysis.   IMPRESSION: 1. No evidence of focal right upper quadrant or intra-abdominal mass. 2. Small right pleural effusion, small amount of intra-abdominal and pelvic ascites, and whole body wall edema. Findings may be related to liver dysfunction, however there are no CT morphologic findings of cirrhosis. There is multi chamber cardiomegaly, and right heart failure could produce a similar appearance. Suggestion of gallbladder wall thickening is likely related to systemic process. No biliary obstruction. 3. Incidental note of pulmonary embolus in a segmental right lower lobe pulmonary artery. 4. Colonic diverticulosis without diverticulitis and atherosclerosis. Critical Value/emergent results were called by telephone at the time of interpretation on 04/30/2015 at 11:09 pm to PA JEFFREY HEDGES , who verbally acknowledged these results.   Electronically Signed   By: Rubye Oaks M.D.   On: 04/30/2015 23:10   Dg Chest Port 1 View  05/01/2015   CLINICAL DATA:  Shortness of breath.  EXAM: PORTABLE CHEST - 1 VIEW  COMPARISON:  CT abdomen/pelvis 1 day prior.  FINDINGS: The heart is enlarged. Mild vascular congestion without overt edema. Minimal atelectasis at the left lung base. No confluent airspace disease. The small right pleural effusion on prior CT is not seen on this portable AP view. No pneumothorax. No acute osseous abnormality.  IMPRESSION: Cardiomegaly with mild vascular congestion.   Electronically Signed   By: Rubye Oaks M.D.   On: 05/01/2015 02:46    Objective  Filed Vitals:   05/01/15 1715 05/01/15 2151 05/02/15 0253 05/02/15 0623  BP: 103/79 122/85 97/70 118/50  Pulse: 87 116 102   Temp:  98.2 F (36.8 C) 98.2 F (36.8 C) 98.5 F (36.9 C)  TempSrc:  Oral Oral Oral  Resp: 20  20  20  Height:      Weight:    67.722 kg (149 lb 4.8 oz)  SpO2: 100% 98% 97% 93%    Intake/Output Summary (Last 24 hours) at 05/02/15 0844 Last data filed at 05/02/15 0302  Gross per 24 hour  Intake 535.58 ml    Output   2175 ml  Net -1639.42 ml   Filed Weights   04/30/15 2342 05/01/15 0618 05/02/15 0623  Weight: 72.576 kg (160 lb) 70.806 kg (156 lb 1.6 oz) 67.722 kg (149 lb 4.8 oz)   Exam:  GENERAL: NAD  HEENT: head NCAT, no scleral icterus.   NECK: Supple   LUNGS: No wheezing or crackles  HEART: Regular rate and rhythm. 2+ pulses, + JVD, 1 + pitting LE edema   ABDOMEN: Soft, nontender, and nondistended. Positive bowel sounds.   EXTREMITIES: Without any cyanosis, clubbing, rash, lesions.  NEUROLOGIC: non focal   Data Reviewed: Basic Metabolic Panel:  Recent Labs Lab 04/30/15 1733 05/01/15 0215 05/01/15 1255 05/02/15 0512  NA 136 135  --  135  K 4.9 4.4  --  3.9  CL 100* 104  --  100*  CO2 24 23  --  26  GLUCOSE 95 128*  --  109*  BUN 19 20  --  23*  CREATININE 1.27* 1.26*  --  1.26*  CALCIUM 9.1 8.6*  --  8.3*  MG  --   --  1.9 2.0   Liver Function Tests:  Recent Labs Lab 04/30/15 1733 05/01/15 0215 05/02/15 0512  AST 120* 90* 50*  ALT 272* 225* 161*  ALKPHOS 100 87 88  BILITOT 1.8* 1.7* 1.7*  PROT 6.5 5.7* 5.4*  ALBUMIN 4.0 3.4* 3.3*    Recent Labs Lab 04/30/15 1733  LIPASE 23   CBC:  Recent Labs Lab 04/30/15 1733 05/01/15 0215 05/02/15 0512  WBC 8.1 9.3 9.4  NEUTROABS  --  7.3  --   HGB 16.1 14.6 14.6  HCT 48.5 45.1 43.5  MCV 92.7 92.2 91.0  PLT 200 168 154   Cardiac Enzymes:  Recent Labs Lab 05/01/15 0215 05/01/15 0710 05/01/15 1255  TROPONINI <0.03 0.03 0.03   BNP (last 3 results)  Recent Labs  05/01/15 0215  BNP 2662.2*   Scheduled Meds: . carvedilol  3.125 mg Oral BID WC  . clonazePAM  1 mg Oral QHS  . gabapentin  300 mg Oral QHS  . Living Better with Heart Failure Book   Does not apply Once  . magnesium oxide  200 mg Oral Daily  . mometasone-formoterol  2 puff Inhalation BID  . sodium chloride  3 mL Intravenous Q12H   Continuous Infusions: . sodium chloride 10 mL/hr at 05/02/15 0426  . heparin 1,400 Units/hr  (05/02/15 0743)    Pamella Pert, MD [facility] Pager [phone]. If 7 PM - 7 AM, please contact night-coverage at [email or web address], password [facility] 05/02/2015, 8:44 AM  LOS: 2 days

## 2015-05-02 NOTE — Interval H&P Note (Signed)
Cath Lab Visit (complete for each Cath Lab visit)  Clinical Evaluation Leading to the Procedure:   ACS: No.  Non-ACS:    Anginal Classification: CCS IV  Anti-ischemic medical therapy: No Therapy  Non-Invasive Test Results: No non-invasive testing performed  Prior CABG: No previous CABG      History and Physical Interval Note:  05/02/2015 1:12 PM  Marcus Bates  has presented today for surgery, with the diagnosis of NSVT, EF 10-15%  The various methods of treatment have been discussed with the patient and family. After consideration of risks, benefits and other options for treatment, the patient has consented to  Procedure(s): Left Heart Cath and Coronary Angiography (N/A) as a surgical intervention .  The patient's history has been reviewed, patient examined, no change in status, stable for surgery.  I have reviewed the patient's chart and labs.  Questions were answered to the patient's satisfaction.     Lesleigh Noe

## 2015-05-02 NOTE — Consult Note (Signed)
Patient ID: Ire Booth MRN: [id number], DOB/AGE: April 08, 1956   Admit date: 04/30/2015   Primary Physician: Lolita Patella, MD Primary Cardiologist: Dr. Delton See  Pt. Profile:  59 y/o male with newly diagnosed nonischemic cardiomyopathy (EF 10-15%) with frequent NSVT and prior history of syncope.   Problem List  Past Medical History  Diagnosis Date  . Asthma   . Anxiety   . Depression   . Insomnia   . Restless leg syndrome     Past Surgical History  Procedure Laterality Date  . Tonsillectomy    . Mastoidectomy       Allergies  Allergies  Allergen Reactions  . Aspirin     REACTION: facial swelling  . Dust Mite Extract   . Other     Cats   . Peanuts [Peanut Oil]     sob    HPI  59 y/o male with no prior cardiac history. He was told in the past, about 20 years ago, of a heart murmur and potential leaky valve but did not undergo any w/u. Over the last year, he has noted unexplained decreased exercise tolerance and recent dyspnea on exertion but no chest pain. He did not seek any medical care for this.   On 04/30/15, he presented to his PCP office with complaints of worsening nausea and vomiting. Laboratory work at PCP office showed elevated LFTs and he was subsequently referred to the ER for further evaluatio. CT of the abdomen/pelvix showed no focal liver abnormalities but did show small right pleural effusion, small amount of intra-abdominal pelvic ascites/whole body wall edema, and incidental note of PE in segmental right lower lobe pulmonary artery. LE doppler also confirmed DVT.  IV heparin was initiated. On telemetry, he was noted to have frequent runs of NSVT, the longest being 20 beats. A 2D echo was obtained which demonstrated severe LVF with an EF of 10-15%, G3DD, severe MR, and severe diffuse hypokinesis. Subsequently, he underwent a diagnostic LHC today by Dr. Katrinka Blazing, revealing widely patent coronary arteries. There was only mild 20% distal RCA disease,  20% ostial to distal circumflex disease and a 25% mid LAD lesion. Given his PE, a RHC was not performed. General Cardiology initiated BB therapy with Coreg. He is currently not on an ACE-I given soft BP and slightly abnormal SCr. EP has been consulted for recommendations regarding his arrhthymias.  In retrospect, the patient recalls suffering a witnessed syncopal episode while at work 1 year ago. He noted symptoms of presyncope prior to colaspase and thinks that he lost consciousness for 10-15 seconds. He did not seek medical attention at that time. Since then he has had frequent spells of near syncope but denies any frank syncope. He also denies any recent viral illnesses. He denies alcohol and tobacco use.   On telemetry, the patient continues to have frequent runs of NSVT between 4-9 beats in the past 12 hrs.    Home Medications  Prior to Admission medications   Medication Sig Start Date End Date Taking? Authorizing Provider  acetaminophen (TYLENOL) 500 MG tablet Take 500 mg by mouth every 6 (six) hours as needed (calm nerves).   Yes Historical Provider, MD  ADVAIR DISKUS 250-50 MCG/DOSE AEPB TAKE 1 PUFF BY MOUTH TWICE A DAY 04/16/15  Yes Historical Provider, MD  albuterol (PROVENTIL HFA;VENTOLIN HFA) 108 (90 BASE) MCG/ACT inhaler Inhale 2 puffs into the lungs every 4 (four) hours as needed for wheezing or shortness of breath. 03/29/15  Yes Ozella Rocks, MD  amphetamine-dextroamphetamine (ADDERALL) 10 MG tablet TAKE 1 TABLET IN THE MORNING TWICE A DAY 03/07/15  Yes Historical Provider, MD  clonazePAM (KLONOPIN) 0.5 MG tablet TAKE 1 TABLET BY MOUTH EVERY EVENING AS DIRECTED Patient taking differently: TAKE 1 TABLET BY MOUTH EVERY EVENING AS NEEDED FOR ANXIETY 11/28/14  Yes Waymon Budge, MD  gabapentin (NEURONTIN) 600 MG tablet Take 300 mg by mouth at bedtime.    Yes Historical Provider, MD  methylphenidate (METADATE ER) 20 MG ER tablet Take 1 tablet (20 mg total) by mouth daily. Patient not  taking: Reported on 04/30/2015 08/12/14   Waymon Budge, MD  mupirocin ointment (BACTROBAN) 2 % Apply 1 application topically 2 (two) times daily. Patient not taking: Reported on 04/30/2015 03/29/15   Ozella Rocks, MD  predniSONE (DELTASONE) 50 MG tablet Take daily with breakfast Patient not taking: Reported on 04/30/2015 03/29/15   Ozella Rocks, MD    Family History  Family History  Problem Relation Age of Onset  . Heart disease Other   . Pulmonary embolism Neg Hx   . CAD Maternal Grandfather     MI in his 48s maybe?  . Stroke Paternal Grandfather   . CAD Maternal Aunt     bypass at 48    Social History  Social History   Social History  . Marital Status: Single    Spouse Name: N/A  . Number of Children: N/A  . Years of Education: N/A   Occupational History  .      Tools   Social History Main Topics  . Smoking status: Never Smoker   . Smokeless tobacco: Never Used  . Alcohol Use: No  . Drug Use: No  . Sexual Activity: Not on file   Other Topics Concern  . Not on file   Social History Narrative     Review of Systems General:  No chills, fever, night sweats or weight changes.  Cardiovascular:  No chest pain, dyspnea on exertion, edema, orthopnea, palpitations, paroxysmal nocturnal dyspnea. Dermatological: No rash, lesions/masses Respiratory: No cough, dyspnea Urologic: No hematuria, dysuria Abdominal:   No nausea, vomiting, diarrhea, bright red blood per rectum, melena, or hematemesis Neurologic:  No visual changes, wkns, changes in mental status. All other systems reviewed and are otherwise negative except as noted above.  Physical Exam  Blood pressure 97/70, pulse 79, temperature 98.5 F (36.9 C), temperature source Oral, resp. rate 10, height 5' 8.5" (1.74 m), weight 157 lb (71.215 kg), SpO2 97 %.  General: Pleasant, NAD Psych: Normal affect. Neuro: Alert and oriented X 3. Moves all extremities spontaneously. HEENT: Normal  Neck: Supple without  bruits or JVD. Lungs:  Decreased BS in RLL, no crackles Heart: irregular rhythm, PACs, regular rate. Abdomen: Soft, non-tender, non-distended, BS + x 4.  Extremities: No clubbing, cyanosis or edema. DP/PT/Radials 2+ and equal bilaterally.  Labs  Troponin (Point of Care Test) No results for input(s): TROPIPOC in the last 72 hours.  Recent Labs  05/01/15 0215 05/01/15 0710 05/01/15 1255  TROPONINI <0.03 0.03 0.03   Lab Results  Component Value Date   WBC 9.4 05/02/2015   HGB 14.6 05/02/2015   HCT 43.5 05/02/2015   MCV 91.0 05/02/2015   PLT 154 05/02/2015    Recent Labs Lab 05/02/15 0512  NA 135  K 3.9  CL 100*  CO2 26  BUN 23*  CREATININE 1.26*  CALCIUM 8.3*  PROT 5.4*  BILITOT 1.7*  ALKPHOS 88  ALT 161*  AST 50*  GLUCOSE 109*   Lab Results  Component Value Date   CHOL 95 05/02/2015   HDL 30* 05/02/2015   LDLCALC 56 05/02/2015   TRIG 44 05/02/2015   No results found for: DDIMER   Radiology/Studies  Ct Abdomen Pelvis W Contrast  04/30/2015   CLINICAL DATA:  Abnormal labs. Elevated AST and ALT. Right upper quadrant mass. Nausea and vomiting for several weeks.  EXAM: CT ABDOMEN AND PELVIS WITH CONTRAST  TECHNIQUE: Multidetector CT imaging of the abdomen and pelvis was performed using the standard protocol following bolus administration of intravenous contrast.  CONTRAST:  100 mL Omnipaque 300 IV  COMPARISON:  None.  FINDINGS: Lower chest: Multi chamber cardiomegaly. Small right effusion. Adjacent ground-glass opacities in the right lower lobe. There is an apparent filling defect within the right lower lobe pulmonary artery. Minimal linear atelectasis in the lingula.  Liver: Contrast refluxing into the IVC and hepatic veins. Liver appears normal in size. No evidence of focal hepatic lesion. No definite morphologic findings of cirrhosis. There is a small amount perihepatic ascites.  Hepatobiliary: Gallbladder physiologically distended, questionable gallbladder wall  thickening. No calcified gallstone. No biliary dilatation, common bile duct measures 6 mm at the porta hepatis.  Pancreas: Fatty atrophy of the head and uncinate process. No focal lesion. No ductal dilatation.  Spleen: Normal in size without focal lesion.  Adrenal glands: No nodule.  Kidneys: Symmetric renal enhancement. No hydronephrosis. No focal lesion.  Stomach/Bowel: Stomach physiologically distended. There are no dilated or thickened small bowel loops. Minimal thickening involving the ascending colon. Small volume of colonic stool. Distal diverticulosis without diverticulitis. The appendix is normal.  Vascular/Lymphatic: No retroperitoneal adenopathy. Abdominal aorta is normal in caliber. Moderate atherosclerosis of the abdominal aorta and its branches. No mesenteric or pelvic adenopathy.  Reproductive: Prostate gland is normal in size.  Bladder: Physiologically distended. Equivocal bladder wall thickening.  Other: Small amount of intra-abdominal ascites primarily in the perihepatic space, tracking in the right pericolic gutter and in the pelvis. No free air or loculated intra-abdominal fluid collection. Small fat containing umbilical hernia. There is mild whole body wall and mesenteric edema.  Musculoskeletal: There are no acute or suspicious osseous abnormalities. Degenerative change in the lumbar spine and at the pubic symphysis.  IMPRESSION: 1. No evidence of focal right upper quadrant or intra-abdominal mass. 2. Small right pleural effusion, small amount of intra-abdominal and pelvic ascites, and whole body wall edema. Findings may be related to liver dysfunction, however there are no CT morphologic findings of cirrhosis. There is multi chamber cardiomegaly, and right heart failure could produce a similar appearance. Suggestion of gallbladder wall thickening is likely related to systemic process. No biliary obstruction. 3. Incidental note of pulmonary embolus in a segmental right lower lobe pulmonary  artery. 4. Colonic diverticulosis without diverticulitis and atherosclerosis. Critical Value/emergent results were called by telephone at the time of interpretation on 04/30/2015 at 11:09 pm to PA JEFFREY HEDGES , who verbally acknowledged these results.   Electronically Signed   By: Rubye Oaks M.D.   On: 04/30/2015 23:10   Dg Chest Port 1 View  05/01/2015   CLINICAL DATA:  Shortness of breath.  EXAM: PORTABLE CHEST - 1 VIEW  COMPARISON:  CT abdomen/pelvis 1 day prior.  FINDINGS: The heart is enlarged. Mild vascular congestion without overt edema. Minimal atelectasis at the left lung base. No confluent airspace disease. The small right pleural effusion on prior CT is not seen on this portable AP view. No pneumothorax. No acute  osseous abnormality.  IMPRESSION: Cardiomegaly with mild vascular congestion.   Electronically Signed   By: Rubye Oaks M.D.   On: 05/01/2015 02:46    ECG  NSR with PVCs  Echocardiogram 05/01/15  Study Conclusions  - Left ventricle: Possible laminated echodense area on inferior wall, cannot exclude laminated thrombus. The cavity size was moderately dilated. Systolic function was normal. The estimated ejection fraction was in the range of 10% to 15%. Severe diffuse hypokinesis. Although no diagnostic regional wall motion abnormality was identified, this possibility cannot be completely excluded on the basis of this study. Doppler parameters are consistent with a reversible restrictive pattern, indicative of decreased left ventricular diastolic compliance and/or increased left atrial pressure (grade 3 diastolic dysfunction). - Mitral valve: There was severe regurgitation. - Left atrium: The atrium was severely dilated. - Right ventricle: The cavity size was dilated. Wall thickness was normal. - Right atrium: The atrium was moderately dilated.    ASSESSMENT AND PLAN  Principal Problem:   Acute combined systolic and diastolic  congestive heart failure Active Problems:   Asthma   Pulmonary embolus   Cardiomegaly   Abnormal LFTs   PE (pulmonary embolism)   Severe mitral regurgitation   LV (left ventricular) mural thrombus   NSVT (nonsustained ventricular tachycardia)   Renal insufficiency   AKI (acute kidney injury)   SOB (shortness of breath)   1. Nonischemic Cardiomyopathy: EF 10-15% by echo. Nonobstructive CAD on cath. Denies alcohol use. No significant history of HTN. ? Mitral valve disease as potential etiology given 2D echo findings. Continue medical management per general cards (ACE-I + BB if BP allows). Consider advanced HF consult.   2. Severe Mitral Regurgitation: patient reports being told more than 20 years ago of a cardiac murmur and potential leaky valve, but did not pursue further w/u. Recent 2D echo demonstrated severe mitral regurgitation with evidence of severe left atrial enlargement, which could be a likely etiology for his cardiomyopathy. Consider TEE for better assessment and, if severe by TEE, recommend surgical consult. However, this may need to be delayed given acute PE. Will defer to general cardiology.    3. Recurrent NSVT: in the setting of problem # 1 with EF at 10-15%. No evidence of significant CAD on cath. Longest documented run was 20 beats. Still with intermittent NSVT up to 4-9 beats in the last 12 hrs. Agree with BB therapy with Coreg for low EF and suppression of ventricular ectopy. Add ACE-I if BP can tolerate. Given his NSVT, low EF and prior h/o syncope, will need to consider a LifeVest at time of discharge for primary prevention of SCD, while titrating HF meds with plans for repeat assessment of EF after 3 months of maximum medical therapy.   4. PE/DVT: currently on IV heparin given recent cath. He will require at least of 6 months of oral anticoagulation. Continue management per primary.    Signed, Robbie Lis, PA-C 05/02/2015, 2:17 PM   EP Attending  Patient seen  and examined. I have reviewed the findings and data, assessment and plan above by Robbie Lis, PA-C. The patient has been admitted and found to have pulmonary embolism and also been found to have NSVT and severe LV dysfunction with MR. He has non-obstructive CAD and a non-ischemic CM. His very remote history of syncope would not have bearing on his treatment now. If he had arrhythmic syncope from VT a year ago in the setting of an EF of 15%, he would have already expired without some  intervention or he would of at least had more syncope. He will need transitioning to either coumadin or a NOAC for his PE. He has non-sustained VT. No indication for anti-arrhythmic drugs at this time. Continue his beta blocker and ACE inhibitor, repeat his echo in 3 months. If he has syncope going forward then would consider going straight to ICD. I would not recommend a life vest at this point in time. Continue on telemetry while you are transitioning to oral anti-coagulation for his PE. Finally, he had PND last night and has been having this for several months so I will start low dose lasix.  Leonia Reeves.D.

## 2015-05-02 NOTE — H&P (View-Only) (Signed)
Patient: Marcus Bates / Admit Date: 04/30/2015 / Date of Encounter: 05/02/2015, 8:03 AM   Subjective: Had the best night's sleep he's had in a while. He spoke with his sister and father last night and added them to his emergency contacts. No CP or SOB. "I feel decent."    Objective: Telemetry: NSR/ST occasional PACs, PVCs, occasional NSVT (up to 18 beats over the last 24 hours), one brief run of PAT and one longer run of what appears to be SVT Physical Exam: Blood pressure 118/50, pulse 102, temperature 98.5 F (36.9 C), temperature source Oral, resp. rate 20, height 5' 8" (1.727 m), weight 149 lb 4.8 oz (67.722 kg), SpO2 93 %. General: Well developed, well nourished WM in no acute distress. Head: Normocephalic, atraumatic, sclera non-icteric, no xanthomas, nares are without discharge. Neck: Negative for carotid bruits. JVP not elevated. Lungs: Crackles at right lung base. No wheezes, rales, or rhonchi. Breathing is unlabored. Heart: RRR S1 S2 without murmurs, rubs, or gallops.  Abdomen: Soft, non-tender, non-distended with normoactive bowel sounds. No rebound/guarding. Extremities: No clubbing or cyanosis. No edema. Distal pedal pulses are 2+ and equal bilaterally. Neuro: Alert and oriented X 3. Moves all extremities spontaneously. Psych:  Responds to questions appropriately with a normal affect.   Intake/Output Summary (Last 24 hours) at 05/02/15 0803 Last data filed at 05/02/15 0302  Gross per 24 hour  Intake 535.58 ml  Output   2175 ml  Net -1639.42 ml    Inpatient Medications:  . carvedilol  3.125 mg Oral BID WC  . clonazePAM  1 mg Oral QHS  . gabapentin  300 mg Oral QHS  . Living Better with Heart Failure Book   Does not apply Once  . magnesium oxide  200 mg Oral Daily  . mometasone-formoterol  2 puff Inhalation BID  . sodium chloride  3 mL Intravenous Q12H   Infusions:  . sodium chloride 10 mL/hr at 05/02/15 0426  . heparin 1,400 Units/hr (05/02/15 0743)     Labs:  Recent Labs  05/01/15 0215 05/01/15 1255 05/02/15 0512  NA 135  --  135  K 4.4  --  3.9  CL 104  --  100*  CO2 23  --  26  GLUCOSE 128*  --  109*  BUN 20  --  23*  CREATININE 1.26*  --  1.26*  CALCIUM 8.6*  --  8.3*  MG  --  1.9 2.0    Recent Labs  05/01/15 0215 05/02/15 0512  AST 90* 50*  ALT 225* 161*  ALKPHOS 87 88  BILITOT 1.7* 1.7*  PROT 5.7* 5.4*  ALBUMIN 3.4* 3.3*    Recent Labs  05/01/15 0215 05/02/15 0512  WBC 9.3 9.4  NEUTROABS 7.3  --   HGB 14.6 14.6  HCT 45.1 43.5  MCV 92.2 91.0  PLT 168 154    Recent Labs  05/01/15 0215 05/01/15 0710 05/01/15 1255  TROPONINI <0.03 0.03 0.03   Invalid input(s): POCBNP No results for input(s): HGBA1C in the last 72 hours.   Radiology/Studies:  Ct Abdomen Pelvis W Contrast  04/30/2015   CLINICAL DATA:  Abnormal labs. Elevated AST and ALT. Right upper quadrant mass. Nausea and vomiting for several weeks.  EXAM: CT ABDOMEN AND PELVIS WITH CONTRAST  TECHNIQUE: Multidetector CT imaging of the abdomen and pelvis was performed using the standard protocol following bolus administration of intravenous contrast.  CONTRAST:  100 mL Omnipaque 300 IV  COMPARISON:  None.  FINDINGS: Lower chest: Multi chamber   cardiomegaly. Small right effusion. Adjacent ground-glass opacities in the right lower lobe. There is an apparent filling defect within the right lower lobe pulmonary artery. Minimal linear atelectasis in the lingula.  Liver: Contrast refluxing into the IVC and hepatic veins. Liver appears normal in size. No evidence of focal hepatic lesion. No definite morphologic findings of cirrhosis. There is a small amount perihepatic ascites.  Hepatobiliary: Gallbladder physiologically distended, questionable gallbladder wall thickening. No calcified gallstone. No biliary dilatation, common bile duct measures 6 mm at the porta hepatis.  Pancreas: Fatty atrophy of the head and uncinate process. No focal lesion. No ductal  dilatation.  Spleen: Normal in size without focal lesion.  Adrenal glands: No nodule.  Kidneys: Symmetric renal enhancement. No hydronephrosis. No focal lesion.  Stomach/Bowel: Stomach physiologically distended. There are no dilated or thickened small bowel loops. Minimal thickening involving the ascending colon. Small volume of colonic stool. Distal diverticulosis without diverticulitis. The appendix is normal.  Vascular/Lymphatic: No retroperitoneal adenopathy. Abdominal aorta is normal in caliber. Moderate atherosclerosis of the abdominal aorta and its branches. No mesenteric or pelvic adenopathy.  Reproductive: Prostate gland is normal in size.  Bladder: Physiologically distended. Equivocal bladder wall thickening.  Other: Small amount of intra-abdominal ascites primarily in the perihepatic space, tracking in the right pericolic gutter and in the pelvis. No free air or loculated intra-abdominal fluid collection. Small fat containing umbilical hernia. There is mild whole body wall and mesenteric edema.  Musculoskeletal: There are no acute or suspicious osseous abnormalities. Degenerative change in the lumbar spine and at the pubic symphysis.  IMPRESSION: 1. No evidence of focal right upper quadrant or intra-abdominal mass. 2. Small right pleural effusion, small amount of intra-abdominal and pelvic ascites, and whole body wall edema. Findings may be related to liver dysfunction, however there are no CT morphologic findings of cirrhosis. There is multi chamber cardiomegaly, and right heart failure could produce a similar appearance. Suggestion of gallbladder wall thickening is likely related to systemic process. No biliary obstruction. 3. Incidental note of pulmonary embolus in a segmental right lower lobe pulmonary artery. 4. Colonic diverticulosis without diverticulitis and atherosclerosis. Critical Value/emergent results were called by telephone at the time of interpretation on 04/30/2015 at 11:09 pm to PA  JEFFREY HEDGES , who verbally acknowledged these results.   Electronically Signed   By: Melanie  Ehinger M.D.   On: 04/30/2015 23:10   Dg Chest Port 1 View  05/01/2015   CLINICAL DATA:  Shortness of breath.  EXAM: PORTABLE CHEST - 1 VIEW  COMPARISON:  CT abdomen/pelvis 1 day prior.  FINDINGS: The heart is enlarged. Mild vascular congestion without overt edema. Minimal atelectasis at the left lung base. No confluent airspace disease. The small right pleural effusion on prior CT is not seen on this portable AP view. No pneumothorax. No acute osseous abnormality.  IMPRESSION: Cardiomegaly with mild vascular congestion.   Electronically Signed   By: Melanie  Ehinger M.D.   On: 05/01/2015 02:46     Assessment and Plan  1. Acute combined congestive heart failure (EF 10-15%, grade 3 DD) - etiology not totally clear - has minimal risk factors for CAD except age and family history, denies recent viral hx/tobacco/alcohol/drugs - given concomitant NSVT, we have recommended heart cath for definitive rule out of ischemic cause for his LV dysfunction - Risks and benefits of cardiac catheterization have been discussed with the patient. These include bleeding, infection, kidney damage, stroke, heart attack, death. The patient understands these risks and is willing to   proceed. Will not do RHC given + PE/DVT. Does not need LV gram - given ongoing diuresis we can defer this to reduce risk of CIN. Also note to cathing MD - patient is aspirin allergic and will require anticoag for PE. - agree with holding Adderall - hold of ACEI/ARB and further Lasix pending stability of Cr post-diuresis/cath - Coreg added yesterday, has received 1 dose thus far - follow BP with addition - dietician consult, CHF book, reviewed salt/fluid restriction with patient  2. Severe mitral regurgitation - further eval for this will depend on cath findings - may need TEE  3. Incidentally noted segmental RLL PE and subsequent acute right DVT,  and superficial thrombus in the left - will need LHC first before changing to oral anticoagulation  4. Possible LV thrombus - will be anticoagulated for the above anyway already  5. NSVT (longest episodes are 18 beats and 20 beats so far) - low dose Mg added - continue BB - will review telemetry with Dr. Abena Erdman given different appearing, longer tachycardia - ?SVT vs VT. Strips printed out and placed in chart. Although the patient denied any episodes of passing out yesterday, today he recalls that he has had one episode of syncope last year while sitting, and over the last year has had episodes of pre-syncope that occur without warning - described as a feeling that all the blood is leaving his head. Before proceeding with LifeVest will consult EP for formal recs. Will keep at [facility] after cath for this.  6. Unspecified renal insufficiency, unclear if AKI or mild CKD - follow  7. Elevated LFTs/nausea - ? Hepatic congestion - improving, hep panel neg  8. Asthma - not actively wheezing. If he does not tolerate Coreg, would switch to Toprol.  Signed, Dayna Dunn PA-C Pager: [phone]  The patient was seen, examined and discussed with Dayna Dunn, PA-C and I agree with the above.   A very pleasant 58-year old male with new dg of acute combined systolic and diastolic CHF, with severely impaired LVEF 10-15% and restrictive pattern of diastolic dysfunction. He presented with nausea and elevated LFTs.  The etiology is unknown, he is scheduled for a  left cardiac cath today. The fact that his both ventricles are dilated, that there is grade 3 DD, severely dilated left atrium is highly suggestive of a long standing process. The patient admits to a recent viral infection. If his cath is normal we will schedule a cardiac MRI to further evaluate.  There is high suspicious for LV thrombus, he is on anticoagulation for subsegmental pulmonary embolism - therefore we will avoid right sided cath. He will  require at least of 6 months of anticoagulation. Currently on iv Heparin. There are several long nsVTs on telemetry up to 20 beats. He continued to have long runs of nsVT overnight, syncope and presyncope in the past. We started a low dose carvedilol, replace magnesium. We will request Lifevest. He feels better today, improved SOB. Diuresed 1.6 L overnight. For now continue iv Lasix, add spironolactone post cath, ACEI/ARB once his Crea is improved, now 1.26. LFTs are improving.  Jonluke Cobbins H 05/02/2015   

## 2015-05-02 NOTE — Progress Notes (Signed)
Pharmacist Heart Failure Core Measure Documentation  Assessment: Marcus Bates has an EF documented as 10-15% on 05/01/15 by ECHO.  Rationale: Heart failure patients with left ventricular systolic dysfunction (LVSD) and an EF < 40% should be prescribed an angiotensin converting enzyme inhibitor (ACEI) or angiotensin receptor blocker (ARB) at discharge unless a contraindication is documented in the medical record.  This patient is not currently on an ACEI or ARB for HF.  This note is being placed in the record in order to provide documentation that a contraindication to the use of these agents is present for this encounter.  ACE Inhibitor or Angiotensin Receptor Blocker is contraindicated (specify all that apply)  []   ACEI allergy AND ARB allergy []   Angioedema []   Moderate or severe aortic stenosis []   Hyperkalemia [x]   Hypotension []   Renal artery stenosis [x]   Worsening renal function, preexisting renal disease or dysfunction   Marcus Bates 05/02/2015 10:34 AM

## 2015-05-03 DIAGNOSIS — I82401 Acute embolism and thrombosis of unspecified deep veins of right lower extremity: Secondary | ICD-10-CM

## 2015-05-03 DIAGNOSIS — I9589 Other hypotension: Secondary | ICD-10-CM | POA: Diagnosis present

## 2015-05-03 LAB — CBC
HCT: 44.5 % (ref 39.0–52.0)
HEMOGLOBIN: 15.2 g/dL (ref 13.0–17.0)
MCH: 30.8 pg (ref 26.0–34.0)
MCHC: 34.2 g/dL (ref 30.0–36.0)
MCV: 90.3 fL (ref 78.0–100.0)
Platelets: 173 10*3/uL (ref 150–400)
RBC: 4.93 MIL/uL (ref 4.22–5.81)
RDW: 15.5 % (ref 11.5–15.5)
WBC: 8.9 10*3/uL (ref 4.0–10.5)

## 2015-05-03 LAB — COMPREHENSIVE METABOLIC PANEL
ALT: 129 U/L — ABNORMAL HIGH (ref 17–63)
ANION GAP: 11 (ref 5–15)
AST: 42 U/L — ABNORMAL HIGH (ref 15–41)
Albumin: 3.1 g/dL — ABNORMAL LOW (ref 3.5–5.0)
Alkaline Phosphatase: 90 U/L (ref 38–126)
BUN: 25 mg/dL — ABNORMAL HIGH (ref 6–20)
CHLORIDE: 98 mmol/L — AB (ref 101–111)
CO2: 22 mmol/L (ref 22–32)
Calcium: 8.4 mg/dL — ABNORMAL LOW (ref 8.9–10.3)
Creatinine, Ser: 1.29 mg/dL — ABNORMAL HIGH (ref 0.61–1.24)
GFR calc non Af Amer: 59 mL/min — ABNORMAL LOW (ref >60)
Glucose, Bld: 144 mg/dL — ABNORMAL HIGH (ref 65–99)
POTASSIUM: 4.5 mmol/L (ref 3.5–5.1)
SODIUM: 131 mmol/L — AB (ref 135–145)
Total Bilirubin: 1.3 mg/dL — ABNORMAL HIGH (ref 0.3–1.2)
Total Protein: 5.6 g/dL — ABNORMAL LOW (ref 6.5–8.1)

## 2015-05-03 LAB — MAGNESIUM: MAGNESIUM: 2.1 mg/dL (ref 1.7–2.4)

## 2015-05-03 LAB — HEPARIN LEVEL (UNFRACTIONATED)
HEPARIN UNFRACTIONATED: 0.32 [IU]/mL (ref 0.30–0.70)
HEPARIN UNFRACTIONATED: 0.58 [IU]/mL (ref 0.30–0.70)

## 2015-05-03 MED ORDER — ALBUTEROL SULFATE (2.5 MG/3ML) 0.083% IN NEBU
2.5000 mg | INHALATION_SOLUTION | RESPIRATORY_TRACT | Status: DC | PRN
  Administered 2015-05-04 – 2015-05-06 (×3): 2.5 mg via RESPIRATORY_TRACT
  Filled 2015-05-03 (×4): qty 3

## 2015-05-03 MED ORDER — CLONAZEPAM 1 MG PO TABS
1.0000 mg | ORAL_TABLET | Freq: Once | ORAL | Status: DC
  Filled 2015-05-03: qty 1

## 2015-05-03 MED ORDER — LEVALBUTEROL HCL 0.63 MG/3ML IN NEBU: 0.6300 mg | INHALATION_SOLUTION | RESPIRATORY_TRACT | Status: DC | PRN

## 2015-05-03 MED ORDER — FUROSEMIDE 40 MG PO TABS
40.0000 mg | ORAL_TABLET | Freq: Every day | ORAL | Status: DC
  Administered 2015-05-03 – 2015-05-06 (×3): 40 mg via ORAL
  Filled 2015-05-03 (×6): qty 1

## 2015-05-03 MED ORDER — RAMIPRIL 2.5 MG PO CAPS
2.5000 mg | ORAL_CAPSULE | Freq: Every day | ORAL | Status: DC
  Administered 2015-05-03 – 2015-05-05 (×3): 2.5 mg via ORAL
  Filled 2015-05-03 (×6): qty 1

## 2015-05-03 NOTE — Progress Notes (Signed)
SUBJECTIVE: Pt says breathing is better today compared to last night. No chest pain/palps.     Intake/Output Summary (Last 24 hours) at 05/03/15 1209 Last data filed at 05/03/15 0930  Gross per 24 hour  Intake 1169.79 ml  Output    350 ml  Net 819.79 ml    Current Facility-Administered Medications  Medication Dose Route Frequency Provider Last Rate Last Dose  . 0.9 %  sodium chloride infusion  250 mL Intravenous PRN Lyn Records, MD      . acetaminophen (TYLENOL) tablet 650 mg  650 mg Oral Q6H PRN Leatha Gilding, MD      . carvedilol (COREG) tablet 3.125 mg  3.125 mg Oral BID WC Dayna N Dunn, PA-C   3.125 mg at 05/03/15 0920  . clonazePAM (KLONOPIN) tablet 1 mg  1 mg Oral QHS Eduard Clos, MD   1 mg at 05/02/15 2140  . clonazePAM (KLONOPIN) tablet 1 mg  1 mg Oral Once Jinger Neighbors, NP   1 mg at 05/03/15 0157  . furosemide (LASIX) tablet 40 mg  40 mg Oral Daily Marinus Maw, MD   40 mg at 05/03/15 1610  . gabapentin (NEURONTIN) capsule 300 mg  300 mg Oral QHS Eduard Clos, MD   300 mg at 05/02/15 2140  . heparin ADULT infusion 100 units/mL (25000 units/250 mL)  1,400 Units/hr Intravenous Continuous Herby Abraham, RPH 14 mL/hr at 05/03/15 1032 1,400 Units/hr at 05/03/15 1032  . levalbuterol (XOPENEX) nebulizer solution 0.63 mg  0.63 mg Nebulization Q4H PRN Nishant Dhungel, MD      . Living Better with Heart Failure Book   Does not apply Once Dayna N Dunn, PA-C      . magnesium oxide (MAG-OX) tablet 200 mg  200 mg Oral Daily Dayna N Dunn, PA-C   200 mg at 05/03/15 9604  . mometasone-formoterol (DULERA) 100-5 MCG/ACT inhaler 2 puff  2 puff Inhalation BID Eduard Clos, MD   2 puff at 05/03/15 [phone]  . oxyCODONE-acetaminophen (PERCOCET/ROXICET) 5-325 MG per tablet 1-2 tablet  1-2 tablet Oral Q4H PRN Lyn Records, MD      . ramipril (ALTACE) capsule 2.5 mg  2.5 mg Oral Daily Marinus Maw, MD   2.5 mg at 05/03/15 8119  . sodium chloride 0.9 % injection 3  mL  3 mL Intravenous Q12H Eduard Clos, MD   3 mL at 05/03/15 1000  . sodium chloride 0.9 % injection 3 mL  3 mL Intravenous Q12H Lyn Records, MD   3 mL at 05/02/15 2338  . sodium chloride 0.9 % injection 3 mL  3 mL Intravenous PRN Lyn Records, MD        Filed Vitals:   05/03/15 1478 05/03/15 0900 05/03/15 1000 05/03/15 1033  BP:  82/51 92/78 90/73   Pulse:   88   Temp:    98.2 F (36.8 C)  TempSrc:    Oral  Resp:  21 22 20   Height:      Weight:      SpO2: 100%  98% 96%    PHYSICAL EXAM General: NAD HEENT: Normal. Neck: No JVD, no thyromegaly.  Lungs: Clear to auscultation bilaterally with normal respiratory effort. CV: Nondisplaced PMI.  Regular rate and rhythm, normal S1/S2, no S3/S4, no murmur.  No pretibial edema.  No carotid bruit.  Normal pedal pulses.  Abdomen: Soft, nontender, no hepatosplenomegaly, no distention.  Neurologic: Alert and  oriented x 3.  Psych: Normal affect. Musculoskeletal: Normal range of motion. No gross deformities. Extremities: No clubbing or cyanosis.   TELEMETRY: Reviewed telemetry pt in sinus rhythm with PVC's and NSVT, longest 11-beat run.  LABS: Basic Metabolic Panel:  Recent Labs  10/06/41 0512 05/03/15 0217  NA 135 131*  K 3.9 4.5  CL 100* 98*  CO2 26 22  GLUCOSE 109* 144*  BUN 23* 25*  CREATININE 1.26* 1.29*  CALCIUM 8.3* 8.4*  MG 2.0 2.1   Liver Function Tests:  Recent Labs  05/02/15 0512 05/03/15 0217  AST 50* 42*  ALT 161* 129*  ALKPHOS 88 90  BILITOT 1.7* 1.3*  PROT 5.4* 5.6*  ALBUMIN 3.3* 3.1*    Recent Labs  04/30/15 1733  LIPASE 23   CBC:  Recent Labs  05/01/15 0215 05/02/15 0512 05/03/15 0217  WBC 9.3 9.4 8.9  NEUTROABS 7.3  --   --   HGB 14.6 14.6 15.2  HCT 45.1 43.5 44.5  MCV 92.2 91.0 90.3  PLT 168 154 173   Cardiac Enzymes:  Recent Labs  05/01/15 0215 05/01/15 0710 05/01/15 1255  TROPONINI <0.03 0.03 0.03   BNP: Invalid input(s): POCBNP D-Dimer: No results for  input(s): DDIMER in the last 72 hours. Hemoglobin A1C: No results for input(s): HGBA1C in the last 72 hours. Fasting Lipid Panel:  Recent Labs  05/02/15 0512  CHOL 95  HDL 30*  LDLCALC 56  TRIG 44  CHOLHDL 3.2   Thyroid Function Tests:  Recent Labs  05/01/15 0215  TSH 1.603   Anemia Panel: No results for input(s): VITAMINB12, FOLATE, FERRITIN, TIBC, IRON, RETICCTPCT in the last 72 hours.  RADIOLOGY: Ct Abdomen Pelvis W Contrast  04/30/2015   CLINICAL DATA:  Abnormal labs. Elevated AST and ALT. Right upper quadrant mass. Nausea and vomiting for several weeks.  EXAM: CT ABDOMEN AND PELVIS WITH CONTRAST  TECHNIQUE: Multidetector CT imaging of the abdomen and pelvis was performed using the standard protocol following bolus administration of intravenous contrast.  CONTRAST:  100 mL Omnipaque 300 IV  COMPARISON:  None.  FINDINGS: Lower chest: Multi chamber cardiomegaly. Small right effusion. Adjacent ground-glass opacities in the right lower lobe. There is an apparent filling defect within the right lower lobe pulmonary artery. Minimal linear atelectasis in the lingula.  Liver: Contrast refluxing into the IVC and hepatic veins. Liver appears normal in size. No evidence of focal hepatic lesion. No definite morphologic findings of cirrhosis. There is a small amount perihepatic ascites.  Hepatobiliary: Gallbladder physiologically distended, questionable gallbladder wall thickening. No calcified gallstone. No biliary dilatation, common bile duct measures 6 mm at the porta hepatis.  Pancreas: Fatty atrophy of the head and uncinate process. No focal lesion. No ductal dilatation.  Spleen: Normal in size without focal lesion.  Adrenal glands: No nodule.  Kidneys: Symmetric renal enhancement. No hydronephrosis. No focal lesion.  Stomach/Bowel: Stomach physiologically distended. There are no dilated or thickened small bowel loops. Minimal thickening involving the ascending colon. Small volume of colonic  stool. Distal diverticulosis without diverticulitis. The appendix is normal.  Vascular/Lymphatic: No retroperitoneal adenopathy. Abdominal aorta is normal in caliber. Moderate atherosclerosis of the abdominal aorta and its branches. No mesenteric or pelvic adenopathy.  Reproductive: Prostate gland is normal in size.  Bladder: Physiologically distended. Equivocal bladder wall thickening.  Other: Small amount of intra-abdominal ascites primarily in the perihepatic space, tracking in the right pericolic gutter and in the pelvis. No free air or loculated intra-abdominal fluid collection. Small fat  containing umbilical hernia. There is mild whole body wall and mesenteric edema.  Musculoskeletal: There are no acute or suspicious osseous abnormalities. Degenerative change in the lumbar spine and at the pubic symphysis.  IMPRESSION: 1. No evidence of focal right upper quadrant or intra-abdominal mass. 2. Small right pleural effusion, small amount of intra-abdominal and pelvic ascites, and whole body wall edema. Findings may be related to liver dysfunction, however there are no CT morphologic findings of cirrhosis. There is multi chamber cardiomegaly, and right heart failure could produce a similar appearance. Suggestion of gallbladder wall thickening is likely related to systemic process. No biliary obstruction. 3. Incidental note of pulmonary embolus in a segmental right lower lobe pulmonary artery. 4. Colonic diverticulosis without diverticulitis and atherosclerosis. Critical Value/emergent results were called by telephone at the time of interpretation on 04/30/2015 at 11:09 pm to PA JEFFREY HEDGES , who verbally acknowledged these results.   Electronically Signed   By: Rubye Oaks M.D.   On: 04/30/2015 23:10   Dg Chest Port 1 View  05/01/2015   CLINICAL DATA:  Shortness of breath.  EXAM: PORTABLE CHEST - 1 VIEW  COMPARISON:  CT abdomen/pelvis 1 day prior.  FINDINGS: The heart is enlarged. Mild vascular congestion  without overt edema. Minimal atelectasis at the left lung base. No confluent airspace disease. The small right pleural effusion on prior CT is not seen on this portable AP view. No pneumothorax. No acute osseous abnormality.  IMPRESSION: Cardiomegaly with mild vascular congestion.   Electronically Signed   By: Rubye Oaks M.D.   On: 05/01/2015 02:46      ASSESSMENT AND PLAN: 1. Acute on chronic systolic and diastolic heart failure/nonischemic cardiomyopathy: Nonobstructive disease by cath yesterday with LVEDP 18 mmHg. On Lasix 40 mg po at present. Symptomatically improved. On low dose Coreg and ramipril. Cannot add spironolactone at this time. Low BP prohibits further increase. See #3 as severe mitral regurgitation may have also contributed to cardiomyopathy. As per Dr. Delton See, consider cardiac MRI as well given nonobstructive CAD.  2. NSVT: Evaluated by EP. Repeat echo in 3 months recommended with consideration for ICD at that time. No indication for antiarrhythmic therapy per EP.   3. Severe MR: Possible etiology of cardiomyopathy. He reports being told he had a "noisy valve" roughly 20 years ago. Consider TEE and possible surgical consultation.  4. RLL PE/DVT of right distal to mid popliteal vein: On heparin. Will need transition to warfarin v target specific anticoagulant.   Prentice Docker, M.D., F.A.C.C.

## 2015-05-03 NOTE — Progress Notes (Signed)
Rapid response in with patient, Marcus Bates in to see patient. BP 89/55, 100% on 2L, feeling little better.  Notified Cardiologist on call, he stats in STEMI will come see patient as soon as he can. Will continue to monitor patient,

## 2015-05-03 NOTE — Progress Notes (Signed)
Subjective/Objective 59 yo M admitted on 8/25 with elevated LFTs and reports of increasing dyspnea with exertion for the past month as well as lower extremity swelling. On admission he underwent a CT abdomen and pelvis which showed anasarca as well as cardiomegaly, also an incidental PE in a segmental right lower lobe pulmonary artery. 2D echo showed a depressed EF 10-15%, grade 3 DD. Patient transferred to [facility] SDU on 8/26 at Cardiology request. He underwent cardiac cath today.  Called to bedside with patient experiencing dyspnea and decrease in BP.  Physical exam: General: Alert, oriented, no acute distress but appears anxious. Asking if nursing will be able to tell if he stops breathing. Lungs: clear bilaterally Heart:  Sinus rhythm with PVCs Extremities: Warm, pedal pulses 2+  Scheduled Meds: . carvedilol  3.125 mg Oral BID WC  . clonazePAM  1 mg Oral QHS  . clonazePAM  1 mg Oral Once  . gabapentin  300 mg Oral QHS  . Living Better with Heart Failure Book   Does not apply Once  . magnesium oxide  200 mg Oral Daily  . mometasone-formoterol  2 puff Inhalation BID  . sodium chloride  3 mL Intravenous Q12H  . sodium chloride  3 mL Intravenous Q12H   Continuous Infusions: . heparin 1,400 Units/hr (05/02/15 2341)   PRN Meds:sodium chloride, acetaminophen, levalbuterol, oxyCODONE-acetaminophen, sodium chloride  Vital signs in last 24 hours: Temp:  [97.5 F (36.4 C)-98.5 F (36.9 C)] 97.5 F (36.4 C) (08/26 1945) Pulse Rate:  [71-102] 91 (08/26 1800) Resp:  [10-31] 17 (08/26 1800) BP: (88-169)/(45-119) 169/119 mmHg (08/26 1800) SpO2:  [93 %-100 %] 99 % (08/26 2346) Weight:  [67.722 kg (149 lb 4.8 oz)-71.215 kg (157 lb)] 71.215 kg (157 lb) (08/26 1229)  Intake/Output last 3 shifts: I/O last 3 completed shifts: In: 855.6 [P.O.:560; I.V.:295.6] Out: 2500 [Urine:2500] Intake/Output this shift:    Problem Assessment/Plan   Dyspnea after cardiac cath today - Received inhaled  Xopenex with some relief. VSS. Lungs clear. Oxygen saturation 99-100% on 2 LNC No chest pain. EKG repeated and unchanged from previous. Some evidence of anxiety. Repeated Clonazepam 1 mg po x 1. Continue to monitor.

## 2015-05-03 NOTE — Progress Notes (Signed)
Patient had 20 beats of VT, then another episode of 9 beats of VT, patient currently sleeping, asymptomatic, 97% on 2L [place], cardiologist Dr End here on floor. Will continue to monitor

## 2015-05-03 NOTE — Progress Notes (Signed)
Patient is having episodes of apnea, patient currently asleep, cardiologist on the floor. Will continue to monitor

## 2015-05-03 NOTE — Progress Notes (Signed)
[facility] PROGRESS NOTE  Marcus Bates ZOX:[id number] DOB: June 15, 1956 DOA: 04/30/2015 PCP: Lolita Patella, MD   Brief narrative 59 year old male was admitted to [facility] on 8/25 with elevated LFTs and symptoms of increased dyspnea with exertion for past 1 month along with lower extremity swelling. On admission he had a CT of his abdomen and pelvis which showed anasarca along with cardiomegaly and incidental PE in this segmental right lower lobe pulmonary artery. 2-D echo done showed a depressed EF of 10-15% with grade 3 diastolic dysfunction. Patient transferred to [facility] for cardiac cath which showed nonobstructive disease.  Assessment/Plan: Acute systolic and diastolic CHF with nonischemic cardiomyopathy Cardiac caths and nonobstructive disease. Currently on Lasix 40 mg by mouth daily. Added low dose Coreg and ramipril. Has a low blood pressure. Plan on adding Aldactone once blood pressure permits. Has severe mitral regurgitation on echo. Plan on TEE per cardiology. Transferred to telemetry.  Nonsustained V. tach Evaluated by EP. Plan to repeat echo in 3 months for constipation of ICD. No indication for antiarrhythmics at this time.  Severe MR Like chondroitin to cardiomyopathy. Plan on TEE followed by surgical consultation.  Acute DVT with right sided PE On IV heparin. Adjacent to Coumadin versus new anticoagulation once she workup completed.  Increasing dyspnea during the night with? Apnea Patient reports that he has been waking up during feeling suffocated and no peripheral bleed. Was given Xopenex with some improvement.  Was followed by Dr. Maple Hudson for excessive sleepiness and had sleep study in 04/2014  consistent with nonspecific hypersomnia and ruled out for sleep apnea. Continue Lasix and when necessary Xopenex.  DVT prophylaxis: IV heparin   Code Status: full code Family Communication:parents at bedside Disposition Plan: transfer to tele. Home  once w/up completed   Consultants:  Cardiology   EP  Procedures:  2D echo  cardiac cath  Antibiotics:  none  HPI/Subjective: Seen   Objective: Filed Vitals:   05/03/15 1345  BP: 74/54  Pulse: 79  Temp: 97.4 F (36.3 C)  Resp: 20    Intake/Output Summary (Last 24 hours) at 05/03/15 1443 Last data filed at 05/03/15 0930  Gross per 24 hour  Intake 1169.79 ml  Output    350 ml  Net 819.79 ml   Filed Weights   05/01/15 0618 05/02/15 0623 05/02/15 1229  Weight: 70.806 kg (156 lb 1.6 oz) 67.722 kg (149 lb 4.8 oz) 71.215 kg (157 lb)    Exam:   General:  Middle aged male in no acute distress  HEENT: No pallor, moist mucosa, no JVD  Cardiovascular: Normal S1 and S2, no murmur  Respiratory: Clear bilaterally  Abdomen: Soft, nondistended, nontender, bowel sounds present  Musculoskeletal: Warm, no edema  CNS: Alert and oriented   Data Reviewed: Basic Metabolic Panel:  Recent Labs Lab 04/30/15 1733 05/01/15 0215 05/01/15 1255 05/02/15 0512 05/03/15 0217  NA 136 135  --  135 131*  K 4.9 4.4  --  3.9 4.5  CL 100* 104  --  100* 98*  CO2 24 23  --  26 22  GLUCOSE 95 128*  --  109* 144*  BUN 19 20  --  23* 25*  CREATININE 1.27* 1.26*  --  1.26* 1.29*  CALCIUM 9.1 8.6*  --  8.3* 8.4*  MG  --   --  1.9 2.0 2.1   Liver Function Tests:  Recent Labs Lab 04/30/15 1733 05/01/15 0215 05/02/15 0512 05/03/15 0217  AST 120* 90* 50* 42*  ALT  272* 225* 161* 129*  ALKPHOS 100 87 88 90  BILITOT 1.8* 1.7* 1.7* 1.3*  PROT 6.5 5.7* 5.4* 5.6*  ALBUMIN 4.0 3.4* 3.3* 3.1*    Recent Labs Lab 04/30/15 1733  LIPASE 23   No results for input(s): AMMONIA in the last 168 hours. CBC:  Recent Labs Lab 04/30/15 1733 05/01/15 0215 05/02/15 0512 05/03/15 0217  WBC 8.1 9.3 9.4 8.9  NEUTROABS  --  7.3  --   --   HGB 16.1 14.6 14.6 15.2  HCT 48.5 45.1 43.5 44.5  MCV 92.7 92.2 91.0 90.3  PLT 200 168 154 173   Cardiac Enzymes:  Recent Labs Lab  05/01/15 0215 05/01/15 0710 05/01/15 1255  TROPONINI <0.03 0.03 0.03   BNP (last 3 results)  Recent Labs  05/01/15 0215  BNP 2662.2*    ProBNP (last 3 results) No results for input(s): PROBNP in the last 8760 hours.  CBG: No results for input(s): GLUCAP in the last 168 hours.  No results found for this or any previous visit (from the past 240 hour(s)).   Studies: No results found.  Scheduled Meds: . carvedilol  3.125 mg Oral BID WC  . clonazePAM  1 mg Oral QHS  . clonazePAM  1 mg Oral Once  . furosemide  40 mg Oral Daily  . gabapentin  300 mg Oral QHS  . Living Better with Heart Failure Book   Does not apply Once  . magnesium oxide  200 mg Oral Daily  . mometasone-formoterol  2 puff Inhalation BID  . ramipril  2.5 mg Oral Daily  . sodium chloride  3 mL Intravenous Q12H  . sodium chloride  3 mL Intravenous Q12H   Continuous Infusions: . heparin 1,400 Units/hr (05/03/15 1032)      Time spent: 25 minutes    Joliyah Lippens  [facility] Pager [phone] If 7PM-7AM, please contact night-coverage at [email or web address], password [facility] 05/03/2015, 2:43 PM  LOS: 3 days

## 2015-05-03 NOTE — Progress Notes (Signed)
Dr End, fellow for the night, states we will continue to monitor patient for now notify if patient becomes symptomatic, requested am labs to be drawn now, notified lab.

## 2015-05-03 NOTE — Progress Notes (Signed)
Respiratory contacted Rapid Response to assess patient.  BP 82/65, patient still feels short of breath O2 applied 2L [place], HR 99, Dr on call paged, EKG performed. 100% sat of [place] 2L

## 2015-05-03 NOTE — Progress Notes (Addendum)
Called to room by patient, patient c/o feeling like he stops breathing.  Patient asked for respirator to give a breathing treatment, the respiratory therapist on floor, notified him.  BP 77/54, RA 99%, HR 100.  Patient denies chest pain/pressure, dizziness.  Gave a 100cc fluid bolus as EF is 10-15%.  Respiratory given breathing treatment.

## 2015-05-03 NOTE — Progress Notes (Addendum)
ANTICOAGULATION CONSULT NOTE  Pharmacy Consult for IV Heparin Indication: pulmonary embolus/DVT  Allergies  Allergen Reactions  . Aspirin     REACTION: facial swelling  . Dust Mite Extract   . Other     Cats   . Peanuts [Peanut Oil]     sob    Patient Measurements: Height: 5' 8.5" (174 cm) Weight: 157 lb (71.215 kg) IBW/kg (Calculated) : 69.55  Vital Signs: Temp: 97.4 F (36.3 C) (08/27 1345) Temp Source: Oral (08/27 1345) BP: 74/54 mmHg (08/27 1345) Pulse Rate: 79 (08/27 1345)  Labs:  Recent Labs  04/30/15 2146 05/01/15 0215  05/01/15 0710 05/01/15 1255  05/02/15 0512 05/03/15 0217 05/03/15 0515 05/03/15 1515  HGB  --  14.6  --   --   --   --  14.6 15.2  --   --   HCT  --  45.1  --   --   --   --  43.5 44.5  --   --   PLT  --  168  --   --   --   --  154 173  --   --   APTT 30  --   --   --   --   --   --   --   --   --   LABPROT 16.7*  --   --   --   --   --   --   --   --   --   INR 1.34  --   --   --   --   --   --   --   --   --   HEPARINUNFRC  --   --   < > 0.34 0.15*  < > 0.69  --  0.32 0.58  CREATININE  --  1.26*  --   --   --   --  1.26* 1.29*  --   --   TROPONINI  --  <0.03  --  0.03 0.03  --   --   --   --   --   < > = values in this interval not displayed.  Estimated Creatinine Clearance: 60.7 mL/min (by C-G formula based on Cr of 1.29).  Assessment: 63 yoM who was referred to ED with abnormal lab work (elevated LFTs).  While there he had complaints of LE swelling, SOB, and nausea.  CT of abdomen shows incidental finding of RLL PE; Dopplers + R DVT; concern for LV thrombus. IV Heparin per Rx initiated pending cardiology work-up for acute onset HF.  Now he is s/p cardiac cath and heparin is to be resumed 8 hrs after sheath removed. Confirmatory heparin level remains therapeutic at 0.58 on 1400 units/hr. RN reports no s/s of bleeding   Goal of Therapy:  Heparin level 0.3-0.7 units/ml Monitor platelets by anticoagulation protocol: Yes   Plan:    Continue heparin infusion at 1400 units/hr   Daily CBC/HL while on heparin  F/U plans for long-term anticoagulation after cath   Vinnie Level, PharmD., BCPS Clinical Pharmacist Pager [phone]

## 2015-05-03 NOTE — Progress Notes (Signed)
ANTICOAGULATION CONSULT NOTE - Follow Up Consult  Pharmacy Consult for heparin Indication: PE/DVT   Labs:  Recent Labs  04/30/15 2146 05/01/15 0215  05/01/15 0710 05/01/15 1255 05/01/15 2108 05/02/15 0512 05/03/15 0217 05/03/15 0515  HGB  --  14.6  --   --   --   --  14.6 15.2  --   HCT  --  45.1  --   --   --   --  43.5 44.5  --   PLT  --  168  --   --   --   --  154 173  --   APTT 30  --   --   --   --   --   --   --   --   LABPROT 16.7*  --   --   --   --   --   --   --   --   INR 1.34  --   --   --   --   --   --   --   --   HEPARINUNFRC  --   --   < > 0.34 0.15* 0.62 0.69  --  0.32  CREATININE  --  1.26*  --   --   --   --  1.26* 1.29*  --   TROPONINI  --  <0.03  --  0.03 0.03  --   --   --   --   < > = values in this interval not displayed.   Assessment/Plan:  59yo male therapeutic on heparin after resumed post cath, currently at low end of goal but expect to continue to accumulate. Will continue gtt at current rate and confirm stable with additional level.   Vernard Gambles, PharmD, BCPS  05/03/2015,5:56 AM

## 2015-05-04 DIAGNOSIS — J9601 Acute respiratory failure with hypoxia: Secondary | ICD-10-CM | POA: Diagnosis present

## 2015-05-04 DIAGNOSIS — I82431 Acute embolism and thrombosis of right popliteal vein: Secondary | ICD-10-CM | POA: Diagnosis present

## 2015-05-04 LAB — CBC
HEMATOCRIT: 43.8 % (ref 39.0–52.0)
HEMOGLOBIN: 14.7 g/dL (ref 13.0–17.0)
MCH: 30.9 pg (ref 26.0–34.0)
MCHC: 33.6 g/dL (ref 30.0–36.0)
MCV: 92.2 fL (ref 78.0–100.0)
PLATELETS: 144 10*3/uL — AB (ref 150–400)
RBC: 4.75 MIL/uL (ref 4.22–5.81)
RDW: 15.4 % (ref 11.5–15.5)
WBC: 7.2 10*3/uL (ref 4.0–10.5)

## 2015-05-04 LAB — HEPARIN LEVEL (UNFRACTIONATED): Heparin Unfractionated: 0.33 IU/mL (ref 0.30–0.70)

## 2015-05-04 LAB — BLOOD GAS, ARTERIAL
Acid-base deficit: 1.5 mmol/L (ref 0.0–2.0)
BICARBONATE: 20.9 meq/L (ref 20.0–24.0)
Drawn by: 225631
FIO2: 0.21
O2 SAT: 98.4 %
PATIENT TEMPERATURE: 98.6
TCO2: 21.6 mmol/L (ref 0–100)
pCO2 arterial: 24.8 mmHg — ABNORMAL LOW (ref 35.0–45.0)
pH, Arterial: 7.535 — ABNORMAL HIGH (ref 7.350–7.450)
pO2, Arterial: 109 mmHg — ABNORMAL HIGH (ref 80.0–100.0)

## 2015-05-04 LAB — MAGNESIUM: Magnesium: 1.9 mg/dL (ref 1.7–2.4)

## 2015-05-04 MED ORDER — RIVAROXABAN 20 MG PO TABS: 20.0000 mg | ORAL_TABLET | Freq: Every day | ORAL | Status: DC

## 2015-05-04 MED ORDER — RIVAROXABAN 15 MG PO TABS
15.0000 mg | ORAL_TABLET | Freq: Two times a day (BID) | ORAL | Status: DC
  Administered 2015-05-04 – 2015-05-07 (×7): 15 mg via ORAL
  Filled 2015-05-04 (×7): qty 1

## 2015-05-04 NOTE — Progress Notes (Signed)
Patient ID: Asheton Scheffler, male   DOB: August 22, 1956, 59 y.o.   MRN: [id number]    Patient Name: Karina Lenderman Date of Encounter: 05/04/2015     Principal Problem:   Acute combined systolic and diastolic congestive heart failure Active Problems:   Asthma   Pulmonary embolus   Cardiomegaly   Abnormal LFTs   PE (pulmonary embolism)   Severe mitral regurgitation   LV (left ventricular) mural thrombus   NSVT (nonsustained ventricular tachycardia)   Renal insufficiency   AKI (acute kidney injury)   SOB (shortness of breath)   Other specified hypotension    SUBJECTIVE  No chest pain, still some dyspnea.   CURRENT MEDS . carvedilol  3.125 mg Oral BID WC  . clonazePAM  1 mg Oral QHS  . clonazePAM  1 mg Oral Once  . furosemide  40 mg Oral Daily  . gabapentin  300 mg Oral QHS  . magnesium oxide  200 mg Oral Daily  . mometasone-formoterol  2 puff Inhalation BID  . ramipril  2.5 mg Oral Daily  . sodium chloride  3 mL Intravenous Q12H  . sodium chloride  3 mL Intravenous Q12H    OBJECTIVE  Filed Vitals:   05/04/15 0225 05/04/15 0420 05/04/15 0430 05/04/15 0817  BP:  87/60  84/64  Pulse:      Temp:   98.3 F (36.8 C)   TempSrc:      Resp:    18  Height:      Weight:   154 lb 6.4 oz (70.035 kg)   SpO2: 100%   97%    Intake/Output Summary (Last 24 hours) at 05/04/15 1049 Last data filed at 05/04/15 0200  Gross per 24 hour  Intake 252.07 ml  Output      0 ml  Net 252.07 ml   Filed Weights   05/02/15 0623 05/02/15 1229 05/04/15 0430  Weight: 149 lb 4.8 oz (67.722 kg) 157 lb (71.215 kg) 154 lb 6.4 oz (70.035 kg)    PHYSICAL EXAM  General: Pleasant, NAD. Neuro: Alert and oriented X 3. Moves all extremities spontaneously. Psych: Normal affect. HEENT:  Normal  Neck: Supple without bruits, JVD 8 cm. Lungs:  Resp regular and unlabored, CTA. Heart: RRR no s3, s4, or murmurs. Abdomen: Soft, non-tender, non-distended, BS + x 4.  Extremities: No clubbing, cyanosis or  edema. DP/PT/Radials 2+ and equal bilaterally.  Accessory Clinical Findings  CBC  Recent Labs  05/03/15 0217 05/04/15 0332  WBC 8.9 7.2  HGB 15.2 14.7  HCT 44.5 43.8  MCV 90.3 92.2  PLT 173 144*   Basic Metabolic Panel  Recent Labs  05/02/15 0512 05/03/15 0217 05/04/15 0332  NA 135 131*  --   K 3.9 4.5  --   CL 100* 98*  --   CO2 26 22  --   GLUCOSE 109* 144*  --   BUN 23* 25*  --   CREATININE 1.26* 1.29*  --   CALCIUM 8.3* 8.4*  --   MG 2.0 2.1 1.9   Liver Function Tests  Recent Labs  05/02/15 0512 05/03/15 0217  AST 50* 42*  ALT 161* 129*  ALKPHOS 88 90  BILITOT 1.7* 1.3*  PROT 5.4* 5.6*  ALBUMIN 3.3* 3.1*   No results for input(s): LIPASE, AMYLASE in the last 72 hours. Cardiac Enzymes  Recent Labs  05/01/15 1255  TROPONINI 0.03   BNP Invalid input(s): POCBNP D-Dimer No results for input(s): DDIMER in the last 72 hours. Hemoglobin A1C  No results for input(s): HGBA1C in the last 72 hours. Fasting Lipid Panel  Recent Labs  05/02/15 0512  CHOL 95  HDL 30*  LDLCALC 56  TRIG 44  CHOLHDL 3.2   Thyroid Function Tests No results for input(s): TSH, T4TOTAL, T3FREE, THYROIDAB in the last 72 hours.  Invalid input(s): FREET3  TELE  nsr with nsvt/pvc's  Radiology/Studies  Ct Abdomen Pelvis W Contrast  04/30/2015   CLINICAL DATA:  Abnormal labs. Elevated AST and ALT. Right upper quadrant mass. Nausea and vomiting for several weeks.  EXAM: CT ABDOMEN AND PELVIS WITH CONTRAST  TECHNIQUE: Multidetector CT imaging of the abdomen and pelvis was performed using the standard protocol following bolus administration of intravenous contrast.  CONTRAST:  100 mL Omnipaque 300 IV  COMPARISON:  None.  FINDINGS: Lower chest: Multi chamber cardiomegaly. Small right effusion. Adjacent ground-glass opacities in the right lower lobe. There is an apparent filling defect within the right lower lobe pulmonary artery. Minimal linear atelectasis in the lingula.  Liver:  Contrast refluxing into the IVC and hepatic veins. Liver appears normal in size. No evidence of focal hepatic lesion. No definite morphologic findings of cirrhosis. There is a small amount perihepatic ascites.  Hepatobiliary: Gallbladder physiologically distended, questionable gallbladder wall thickening. No calcified gallstone. No biliary dilatation, common bile duct measures 6 mm at the porta hepatis.  Pancreas: Fatty atrophy of the head and uncinate process. No focal lesion. No ductal dilatation.  Spleen: Normal in size without focal lesion.  Adrenal glands: No nodule.  Kidneys: Symmetric renal enhancement. No hydronephrosis. No focal lesion.  Stomach/Bowel: Stomach physiologically distended. There are no dilated or thickened small bowel loops. Minimal thickening involving the ascending colon. Small volume of colonic stool. Distal diverticulosis without diverticulitis. The appendix is normal.  Vascular/Lymphatic: No retroperitoneal adenopathy. Abdominal aorta is normal in caliber. Moderate atherosclerosis of the abdominal aorta and its branches. No mesenteric or pelvic adenopathy.  Reproductive: Prostate gland is normal in size.  Bladder: Physiologically distended. Equivocal bladder wall thickening.  Other: Small amount of intra-abdominal ascites primarily in the perihepatic space, tracking in the right pericolic gutter and in the pelvis. No free air or loculated intra-abdominal fluid collection. Small fat containing umbilical hernia. There is mild whole body wall and mesenteric edema.  Musculoskeletal: There are no acute or suspicious osseous abnormalities. Degenerative change in the lumbar spine and at the pubic symphysis.  IMPRESSION: 1. No evidence of focal right upper quadrant or intra-abdominal mass. 2. Small right pleural effusion, small amount of intra-abdominal and pelvic ascites, and whole body wall edema. Findings may be related to liver dysfunction, however there are no CT morphologic findings of  cirrhosis. There is multi chamber cardiomegaly, and right heart failure could produce a similar appearance. Suggestion of gallbladder wall thickening is likely related to systemic process. No biliary obstruction. 3. Incidental note of pulmonary embolus in a segmental right lower lobe pulmonary artery. 4. Colonic diverticulosis without diverticulitis and atherosclerosis. Critical Value/emergent results were called by telephone at the time of interpretation on 04/30/2015 at 11:09 pm to PA JEFFREY HEDGES , who verbally acknowledged these results.   Electronically Signed   By: Rubye Oaks M.D.   On: 04/30/2015 23:10   Dg Chest Port 1 View  05/01/2015   CLINICAL DATA:  Shortness of breath.  EXAM: PORTABLE CHEST - 1 VIEW  COMPARISON:  CT abdomen/pelvis 1 day prior.  FINDINGS: The heart is enlarged. Mild vascular congestion without overt edema. Minimal atelectasis at the left lung base.  No confluent airspace disease. The small right pleural effusion on prior CT is not seen on this portable AP view. No pneumothorax. No acute osseous abnormality.  IMPRESSION: Cardiomegaly with mild vascular congestion.   Electronically Signed   By: Rubye Oaks M.D.   On: 05/01/2015 02:46    ASSESSMENT AND PLAN  1. Chronic systolic heart failure 2. Pulmonary embolism 3. NSVT Rec: continue current meds. He has had a 3 lb diuresis on lasix. Ok for discharge from my perspective once anti-coagulation status sorted out. He is not a candidate for TEE. His severe LV function currently makes him not a candiate for MV surgery. We will repeat his echo in 3 months and reassess. Discussed the importance of a low sodium diet.   Gregg Taylor,M.D.  05/04/2015 10:49 AM

## 2015-05-04 NOTE — Consult Note (Addendum)
Name: Marcus Bates MRN: [id number] DOB: [birth year]    ADMISSION DATE:  04/30/2015 CONSULTATION DATE:  05/04/2015  REFERRING MD :   TRH Dhungel  CHIEF COMPLAINT:   Nocturnal dyspnea  BRIEF PATIENT DESCRIPTION:  60 year old male with acute on chronic systolic heart failure with newly diagnosed ejection fraction 15%. Pre-existing history of difficulty with sleep at night. Previous sleep study did not reveal sleep apnea.  The patient was not on oxygen prior to this current admission. Patient was admitted with progressive dyspnea lower extremity edema chest discomfort. Workup has shown stage IV heart failure systolic with associated pulmonary embolism and deep venous thrombosis.  SIGNIFICANT EVENTS    STUDIES:  CT of abdomen showed incidental finding of right lower lobe pulmonary embolism and associated groundglass opacities in right lower lobe  Vascular Doppler study on 05/01/2015 revealed acute deep venous thrombosis in the right popliteal vein right lower extremity  Cardiac catheterization on 05/02/2015 revealed no coronary artery disease  Echocardiogram on 05/01/2015 revealed ejection fraction 10-15% global hypokinesis there was significant mitral regurgitation and also dilation of the right ventricle and right atrium   HISTORY OF PRESENT ILLNESS:   59 year old male has had progressive dyspnea particularly nocturnal and chest tightness over the past several months.  Patient had associated nausea as well with exertional shortness of breath. Patient was found to have abnormal liver function tests and a primary care office and this necessitated transfer over to the emergency room on 05/01/2015. Because of the primary symptom complex of lower extremity edema and nausea CT abdomen and vascular to venous Doppler extremity studies were performed. Abdominal CT scan showed no acute liver process but mesenteric and abdominal wall edema with four-chamber cardiomegaly. There is also right lower  extremity deep venous thrombosis in the popliteal vein seen and a right lower lobe pulmonary embolism seen on CT abdomen. The patient was admitted for further evaluation. Cardiac catheterization did not reveal coronary artery disease. No previous sleep study done in the past year showed no sleep apnea. The patient has not been on supplement oxygen since prior to admission.  With treatment of heart failure and diuresis the patient has improved. This still remains on supplemental oxygen.  PAST MEDICAL HISTORY :   has a past medical history of Asthma; Anxiety; Depression; Insomnia; and Restless leg syndrome.  has past surgical history that includes Tonsillectomy and Mastoidectomy. Prior to Admission medications   Medication Sig Start Date End Date Taking? Authorizing Provider  acetaminophen (TYLENOL) 500 MG tablet Take 500 mg by mouth every 6 (six) hours as needed (calm nerves).   Yes Historical Provider, MD  ADVAIR DISKUS 250-50 MCG/DOSE AEPB TAKE 1 PUFF BY MOUTH TWICE A DAY 04/16/15  Yes Historical Provider, MD  albuterol (PROVENTIL HFA;VENTOLIN HFA) 108 (90 BASE) MCG/ACT inhaler Inhale 2 puffs into the lungs every 4 (four) hours as needed for wheezing or shortness of breath. 03/29/15  Yes Ozella Rocks, MD  amphetamine-dextroamphetamine (ADDERALL) 10 MG tablet TAKE 1 TABLET IN THE MORNING TWICE A DAY 03/07/15  Yes Historical Provider, MD  clonazePAM (KLONOPIN) 0.5 MG tablet TAKE 1 TABLET BY MOUTH EVERY EVENING AS DIRECTED Patient taking differently: TAKE 1 TABLET BY MOUTH EVERY EVENING AS NEEDED FOR ANXIETY 11/28/14  Yes Waymon Budge, MD  gabapentin (NEURONTIN) 600 MG tablet Take 300 mg by mouth at bedtime.    Yes Historical Provider, MD  methylphenidate (METADATE ER) 20 MG ER tablet Take 1 tablet (20 mg total) by mouth daily. Patient not taking: Reported  on 04/30/2015 08/12/14   Waymon Budge, MD  mupirocin ointment (BACTROBAN) 2 % Apply 1 application topically 2 (two) times daily. Patient not  taking: Reported on 04/30/2015 03/29/15   Ozella Rocks, MD  predniSONE (DELTASONE) 50 MG tablet Take daily with breakfast Patient not taking: Reported on 04/30/2015 03/29/15   Ozella Rocks, MD   Allergies  Allergen Reactions  . Aspirin     REACTION: facial swelling  . Dust Mite Extract   . Other     Cats   . Peanuts [Peanut Oil]     sob    FAMILY HISTORY:  family history includes CAD in his maternal aunt and maternal grandfather; Heart disease in his other; Stroke in his paternal grandfather. There is no history of Pulmonary embolism. SOCIAL HISTORY:  reports that he has never smoked. He has never used smokeless tobacco. He reports that he does not drink alcohol or use illicit drugs.  REVIEW OF SYSTEMS:   Pos in BOLD Constitutional: Negative for fever, chills, weight loss, malaise/fatigue and diaphoresis.  HENT: Negative for hearing loss, ear pain, nosebleeds, congestion, sore throat, neck pain, tinnitus and ear discharge.   Eyes: Negative for blurred vision, double vision, photophobia, pain, discharge and redness.  Respiratory:cough, hemoptysis, sputum production, shortness of breath, wheezing and stridor.   Cardiovascular:  chest pain, palpitations, orthopnea, claudication, leg swelling and PND.  Gastrointestinal: Negative for heartburn, nausea, vomiting, abdominal pain, diarrhea, constipation, blood in stool and melena.  Genitourinary: Negative for dysuria, urgency, frequency, hematuria and flank pain.  Musculoskeletal: Negative for myalgias, back pain, joint pain and falls.  Skin: Negative for itching and rash.  Neurological: Negative for dizziness, tingling, tremors, sensory change, speech change, focal weakness, seizures, loss of consciousness, weakness and headaches.  Endo/Heme/Allergies: Negative for environmental allergies and polydipsia. Does not bruise/bleed easily.  SUBJECTIVE:   VITAL SIGNS: Temp:  [97.4 F (36.3 C)-98.9 F (37.2 C)] 98.3 F (36.8 C) (08/28  0430) Pulse Rate:  [79-91] 80 (08/27 2100) Resp:  [15-20] 18 (08/28 0817) BP: (72-87)/(52-64) 84/64 mmHg (08/28 0817) SpO2:  [97 %-100 %] 97 % (08/28 0817) Weight:  [70.035 kg (154 lb 6.4 oz)] 70.035 kg (154 lb 6.4 oz) (08/28 0430)  PHYSICAL EXAMINATION: General:  Ill-appearing male in no acute distress Neuro:  Nonfocal HEENT:  Jugular venous distention noted Cardiovascular:  Regular rate and rhythm S3 gallop noted systolic murmur noted Lungs:  Decreased breath sounds rales the right base Abdomen:  Soft nontender liver slightly enlarged Musculoskeletal:  Full range of motion Skin:  Clear   Recent Labs Lab 05/01/15 0215 05/02/15 0512 05/03/15 0217  NA 135 135 131*  K 4.4 3.9 4.5  CL 104 100* 98*  CO2 BUN 20 23* 25*  CREATININE 1.26* 1.26* 1.29*  GLUCOSE 128* 109* 144*    Recent Labs Lab 05/02/15 0512 05/03/15 0217 05/04/15 0332  HGB 14.6 15.2 14.7  HCT 43.5 44.5 43.8  WBC 9.4 8.9 7.2  PLT 154 173 144*   No results found.  ASSESSMENT / PLAN: Principal Problem:   Acute combined systolic and diastolic congestive heart failure Active Problems:   Moderate persistent asthma in adult without complication   Cardiomegaly   Abnormal LFTs   PE (pulmonary embolism)   Severe mitral regurgitation   LV (left ventricular) mural thrombus   NSVT (nonsustained ventricular tachycardia)   Renal insufficiency   AKI (acute kidney injury)   SOB (shortness of breath)   Other specified hypotension  Acute deep vein thrombosis (DVT) of popliteal vein of right lower extremity   Acute respiratory failure with hypoxia   #1  acute hypoxemic respiratory failure on the basis of acute heart failure systolic nonischemic. Associated right lower lobe pulmonary embolism and deep venous thrombosis as well.  This patient appears to have dyspnea on the basis of the pulmonary embolism and heart failure. There is no evidence of sleep apnea. There is no indication for C Pap therapy.  This  patient will likely need home oxygen therapy initially.   Recommendations   I would continue anticoagulation was Xarelto for now, given his comorbid factors I    would suggest lifelong anticoagulation as this was an unprovoked venous    thrombosis and a male with associated significant cardiomyopathy and mural    thrombus and ventricle   Continue cardiac treatment of heart failure   I will order oxygen assessment for home therapy   No indication for C Pap therapy   We will continue to follow this patient with you  #2 moderate persistent asthma with the patient on Advair chronically at home  Recommendations   The patient is currently on Dulera in the hospital and I would maintain this for now  `  transition back to Advair upon discharge, it does not appear acute exacerbation of   asthma is playing a role in this patient's current symptom complex  Caryl Bis  [phone]  Cell  [phone]  If no response or cell goes to voicemail, call beeper [phone]  Pulmonary and Critical Care Medicine [facility] Pager: [phone]  05/04/2015, 1:02 PM

## 2015-05-04 NOTE — Progress Notes (Signed)
[facility] PROGRESS NOTE  Marcus Bates ZOX:[id number] DOB: February 21, 1956 DOA: 04/30/2015 PCP: Lolita Patella, MD   Brief narrative 59 year old male was admitted to [facility] on 8/25 with elevated LFTs and symptoms of increased dyspnea with exertion for past 1 month along with lower extremity swelling. On admission he had a CT of his abdomen and pelvis which showed anasarca along with cardiomegaly and incidental PE in this segmental right lower lobe pulmonary artery. 2-D echo done showed a depressed EF of 10-15% with grade 3 diastolic dysfunction. Patient transferred to [facility] for cardiac cath which showed nonobstructive disease.  Assessment/Plan: Acute systolic and diastolic CHF with nonischemic cardiomyopathy Cardiac cath shows nonobstructive disease. Currently on Lasix 40 mg by mouth daily. Added low dose Coreg and ramipril. Blood pressure is low. Plan on adding Aldactone once blood pressure permits. Has severe mitral regurgitation on echo. Discussed with cardiology and EP today. Do not recommend TEE and role for valve repair at this time given severe regurgitation of unknown duration. -Recommend to repeat a 2-D echo in 3 months to evaluate his heart function.   Nonsustained V. tach Evaluated by EP. Plan to repeat echo in 3 months for consideration of ICD. No indication for antiarrhythmics or LifeVest at this time.  Severe MR Secondary to cardiomyopathy. No role for TEE and valve replacement at this time as it will not improve his heart function.  Acute DVT with right sided PE On IV heparin. Since no intervention planned will start him on Xarelto.  Increasing dyspnea during the night with? Apnea Patient reports that he has been waking up during feeling suffocated and gasping for air. Sats recorded to be normal.Was followed by Dr. Maple Hudson for excessive sleepiness and had sleep study in 04/2014  consistent with nonspecific hypersomnia and ruled out for sleep  apnea. -I will check a blood gas. Unclear if this is associated wiversus nocturnal dyspnea or anxiety.  Continue Lasix and when necessary Xopenex. Keep head of bed elevated at 30 during sleep -I have consulted pulmonary to evaluate the patient and provide further recommendations..  Hypotension Possibly associated with severe CHF. Patient is asymptomatic. On Coreg, Lasix and low-dose ACE inhibitor. Have ordered holding parameters.  Transaminitis Possibly hepatic ischemia with CHF. Liver and gallbladder on admission CT unremarkable.   DVT prophylaxis: will start patient on Xarelto.   diet: Heart healthy  Code Status: full code Family Communication:parents at bedside Disposition: Continue telemetry monitoring today. Possibly home tomorrow.   Consultants:  Cardiology   EP  Pulmonary  Procedures:  2D echo  cardiac cath  Antibiotics:  none  HPI/Subjective: Patient seen and examined. Reports having apneic again during the night again.  Objective: Filed Vitals:   05/04/15 0817  BP: 84/64  Pulse:   Temp:   Resp: 18    Intake/Output Summary (Last 24 hours) at 05/04/15 1108 Last data filed at 05/04/15 0200  Gross per 24 hour  Intake 252.07 ml  Output      0 ml  Net 252.07 ml   Filed Weights   05/02/15 0623 05/02/15 1229 05/04/15 0430  Weight: 67.722 kg (149 lb 4.8 oz) 71.215 kg (157 lb) 70.035 kg (154 lb 6.4 oz)    Exam:   General:  Middle aged male in no acute distress  HEENT: No pallor, moist mucosa, no JVD  Cardiovascular: Normal S1 and S2, no murmur  Respiratory: Clear bilaterally  Abdomen: Soft, nondistended, nontender, bowel sounds present  Musculoskeletal: Warm, no edema  CNS: Alert and oriented  Data Reviewed: Basic Metabolic Panel:  Recent Labs Lab 04/30/15 1733 05/01/15 0215 05/01/15 1255 05/02/15 0512 05/03/15 0217 05/04/15 0332  NA 136 135  --  135 131*  --   K 4.9 4.4  --  3.9 4.5  --   CL 100* 104  --  100* 98*  --   CO2  24 23  --  26 22  --   GLUCOSE 95 128*  --  109* 144*  --   BUN 19 20  --  23* 25*  --   CREATININE 1.27* 1.26*  --  1.26* 1.29*  --   CALCIUM 9.1 8.6*  --  8.3* 8.4*  --   MG  --   --  1.9 2.0 2.1 1.9   Liver Function Tests:  Recent Labs Lab 04/30/15 1733 05/01/15 0215 05/02/15 0512 05/03/15 0217  AST 120* 90* 50* 42*  ALT 272* 225* 161* 129*  ALKPHOS 100 87 88 90  BILITOT 1.8* 1.7* 1.7* 1.3*  PROT 6.5 5.7* 5.4* 5.6*  ALBUMIN 4.0 3.4* 3.3* 3.1*    Recent Labs Lab 04/30/15 1733  LIPASE 23   No results for input(s): AMMONIA in the last 168 hours. CBC:  Recent Labs Lab 04/30/15 1733 05/01/15 0215 05/02/15 0512 05/03/15 0217 05/04/15 0332  WBC 8.1 9.3 9.4 8.9 7.2  NEUTROABS  --  7.3  --   --   --   HGB 16.1 14.6 14.6 15.2 14.7  HCT 48.5 45.1 43.5 44.5 43.8  MCV 92.7 92.2 91.0 90.3 92.2  PLT 200 168 154 173 144*   Cardiac Enzymes:  Recent Labs Lab 05/01/15 0215 05/01/15 0710 05/01/15 1255  TROPONINI <0.03 0.03 0.03   BNP (last 3 results)  Recent Labs  05/01/15 0215  BNP 2662.2*    ProBNP (last 3 results) No results for input(s): PROBNP in the last 8760 hours.  CBG: No results for input(s): GLUCAP in the last 168 hours.  No results found for this or any previous visit (from the past 240 hour(s)).   Studies: No results found.  Scheduled Meds: . carvedilol  3.125 mg Oral BID WC  . clonazePAM  1 mg Oral QHS  . clonazePAM  1 mg Oral Once  . furosemide  40 mg Oral Daily  . gabapentin  300 mg Oral QHS  . magnesium oxide  200 mg Oral Daily  . mometasone-formoterol  2 puff Inhalation BID  . ramipril  2.5 mg Oral Daily  . sodium chloride  3 mL Intravenous Q12H  . sodium chloride  3 mL Intravenous Q12H   Continuous Infusions: . heparin 1,400 Units/hr (05/04/15 0500)      Time spent: 25 minutes    Marcus Bates  [facility] Pager [phone] If 7PM-7AM, please contact night-coverage at [email or web address], password [facility] 05/04/2015,  11:08 AM  LOS: 4 days

## 2015-05-04 NOTE — Progress Notes (Addendum)
ANTICOAGULATION CONSULT NOTE - Follow Up Consult  Pharmacy Consult for heparin Indication: pulmonary embolus/DVT  Allergies  Allergen Reactions  . Aspirin     REACTION: facial swelling  . Dust Mite Extract   . Other     Cats   . Peanuts [Peanut Oil]     sob    Patient Measurements: Height: 5' 8.5" (174 cm) Weight: 154 lb 6.4 oz (70.035 kg) IBW/kg (Calculated) : 69.55  Vital Signs: Temp: 98.3 F (36.8 C) (08/28 0430) BP: 84/64 mmHg (08/28 0817)  Labs:  Recent Labs  05/01/15 1255  05/02/15 0512 05/03/15 0217 05/03/15 0515 05/03/15 1515 05/04/15 0332  HGB  --   < > 14.6 15.2  --   --  14.7  HCT  --   --  43.5 44.5  --   --  43.8  PLT  --   --  154 173  --   --  144*  HEPARINUNFRC 0.15*  < > 0.69  --  0.32 0.58 0.33  CREATININE  --   --  1.26* 1.29*  --   --   --   TROPONINI 0.03  --   --   --   --   --   --   < > = values in this interval not displayed.  Estimated Creatinine Clearance: 60.7 mL/min (by C-G formula based on Cr of 1.29).   Assessment:  89 yoM who was referred to ED with abnormal lab work (elevated LFTs). While there he had complaints of LE swelling, SOB, and nausea. CT of abdomen shows incidental finding of RLL PE; Dopplers + R DVT; concern for LV thrombus. IV Heparin per Rx initiated pending cardiology work-up for acute onset HF. Now s/p cardiac cath. Heparin has been resumed.   Current HL 0.33, hgb 14.7, plts 144.   Goal of Therapy:  Heparin level 0.3-0.7 units/ml Monitor platelets by anticoagulation protocol: Yes   Plan:  -Continue heparin infusion at 1400 units/hr -Daily CBC/HL while on heparin -F/U plans for long-term anticoagulation after cath  Sandi Carne, PharmD Pharmacy Resident Pager: [phone] 05/04/2015,9:29 AM  _________________________________________________  Addendum:  To start Xarelto 15 mg BID with meals x 21 days, then Xarelto 20 mg daily with supper. Heparin to discontinue.

## 2015-05-04 NOTE — Discharge Instructions (Addendum)
Heart Failure °Heart failure is a condition in which the heart has trouble pumping blood. This means your heart does not pump blood efficiently for your body to work well. In some cases of heart failure, fluid may back up into your lungs or you may have swelling (edema) in your lower legs. Heart failure is usually a long-term (chronic) condition. It is important for you to take good care of yourself and follow your health care provider's treatment plan. °CAUSES  °Some health conditions can cause heart failure. Those health conditions include: °· High blood pressure (hypertension). Hypertension causes the heart muscle to work harder than normal. When pressure in the blood vessels is high, the heart needs to pump (contract) with more force in order to circulate blood throughout the body. High blood pressure eventually causes the heart to become stiff and weak. °· Coronary artery disease (CAD). CAD is the buildup of cholesterol and fat (plaque) in the arteries of the heart. The blockage in the arteries deprives the heart muscle of oxygen and blood. This can cause chest pain and may lead to a heart attack. High blood pressure can also contribute to CAD. °· Heart attack (myocardial infarction). A heart attack occurs when one or more arteries in the heart become blocked. The loss of oxygen damages the muscle tissue of the heart. When this happens, part of the heart muscle dies. The injured tissue does not contract as well and weakens the heart's ability to pump blood. °· Abnormal heart valves. When the heart valves do not open and close properly, it can cause heart failure. This makes the heart muscle pump harder to keep the blood flowing. °· Heart muscle disease (cardiomyopathy or myocarditis). Heart muscle disease is damage to the heart muscle from a variety of causes. These can include drug or alcohol abuse, infections, or unknown reasons. These can increase the risk of heart failure. °· Lung disease. Lung disease  makes the heart work harder because the lungs do not work properly. This can cause a strain on the heart, leading it to fail. °· Diabetes. Diabetes increases the risk of heart failure. High blood sugar contributes to high fat (lipid) levels in the blood. Diabetes can also cause slow damage to tiny blood vessels that carry important nutrients to the heart muscle. When the heart does not get enough oxygen and food, it can cause the heart to become weak and stiff. This leads to a heart that does not contract efficiently. °· Other conditions can contribute to heart failure. These include abnormal heart rhythms, thyroid problems, and low blood counts (anemia). °Certain unhealthy behaviors can increase the risk of heart failure, including: °· Being overweight. °· Smoking or chewing tobacco. °· Eating foods high in fat and cholesterol. °· Abusing illicit drugs or alcohol. °· Lacking physical activity. °SYMPTOMS  °Heart failure symptoms may vary and can be hard to detect. Symptoms may include: °· Shortness of breath with activity, such as climbing stairs. °· Persistent cough. °· Swelling of the feet, ankles, legs, or abdomen. °· Unexplained weight gain. °· Difficulty breathing when lying flat (orthopnea). °· Waking from sleep because of the need to sit up and get more air. °· Rapid heartbeat. °· Fatigue and loss of energy. °· Feeling light-headed, dizzy, or close to fainting. °· Loss of appetite. °· Nausea. °· Increased urination during the night (nocturia). °DIAGNOSIS  °A diagnosis of heart failure is based on your history, symptoms, physical examination, and diagnostic tests. Diagnostic tests for heart failure may include: °·   Echocardiography. °· Electrocardiography. °· Chest X-ray. °· Blood tests. °· Exercise stress test. °· Cardiac angiography. °· Radionuclide scans. °TREATMENT  °Treatment is aimed at managing the symptoms of heart failure. Medicines, behavioral changes, or surgical intervention may be necessary to  treat heart failure. °· Medicines to help treat heart failure may include: °¨ Angiotensin-converting enzyme (ACE) inhibitors. This type of medicine blocks the effects of a blood protein called angiotensin-converting enzyme. ACE inhibitors relax (dilate) the blood vessels and help lower blood pressure. °¨ Angiotensin receptor blockers (ARBs). This type of medicine blocks the actions of a blood protein called angiotensin. Angiotensin receptor blockers dilate the blood vessels and help lower blood pressure. °¨ Water pills (diuretics). Diuretics cause the kidneys to remove salt and water from the blood. The extra fluid is removed through urination. This loss of extra fluid lowers the volume of blood the heart pumps. °¨ Beta blockers. These prevent the heart from beating too fast and improve heart muscle strength. °¨ Digitalis. This increases the force of the heartbeat. °· Healthy behavior changes include: °¨ Obtaining and maintaining a healthy weight. °¨ Stopping smoking or chewing tobacco. °¨ Eating heart-healthy foods. °¨ Limiting or avoiding alcohol. °¨ Stopping illicit drug use. °¨ Physical activity as directed by your health care provider. °· Surgical treatment for heart failure may include: °¨ A procedure to open blocked arteries, repair damaged heart valves, or remove damaged heart muscle tissue. °¨ A pacemaker to improve heart muscle function and control certain abnormal heart rhythms. °¨ An internal cardioverter defibrillator to treat certain serious abnormal heart rhythms. °¨ A left ventricular assist device (LVAD) to assist the pumping ability of the heart. °HOME CARE INSTRUCTIONS  °· Take medicines only as directed by your health care provider. Medicines are important in reducing the workload of your heart, slowing the progression of heart failure, and improving your symptoms. °¨ Do not stop taking your medicine unless directed by your health care provider. °¨ Do not skip any dose of medicine. °¨ Refill your  prescriptions before you run out of medicine. Your medicines are needed every day. °· Engage in moderate physical activity if directed by your health care provider. Moderate physical activity can benefit some people. The elderly and people with severe heart failure should consult with a health care provider for physical activity recommendations. °· Eat heart-healthy foods. Food choices should be free of trans fat and low in saturated fat, cholesterol, and salt (sodium). Healthy choices include fresh or frozen fruits and vegetables, fish, lean meats, legumes, fat-free or low-fat dairy products, and whole grain or high fiber foods. Talk to a dietitian to learn more about heart-healthy foods. °· Limit sodium if directed by your health care provider. Sodium restriction may reduce symptoms of heart failure in some people. Talk to a dietitian to learn more about heart-healthy seasonings. °· Use healthy cooking methods. Healthy cooking methods include roasting, grilling, broiling, baking, poaching, steaming, or stir-frying. Talk to a dietitian to learn more about healthy cooking methods. °· Limit fluids if directed by your health care provider. Fluid restriction may reduce symptoms of heart failure in some people. °· Weigh yourself every day. Daily weights are important in the early recognition of excess fluid. You should weigh yourself every morning after you urinate and before you eat breakfast. Wear the same amount of clothing each time you weigh yourself. Record your daily weight. Provide your health care provider with your weight record. °· Monitor and record your blood pressure if directed by your health care   provider.  Check your pulse if directed by your health care provider.  Lose weight if directed by your health care provider. Weight loss may reduce symptoms of heart failure in some people.  Stop smoking or chewing tobacco. Nicotine makes your heart work harder by causing your blood vessels to constrict.  Do not use nicotine gum or patches before talking to your health care provider.  Keep all follow-up visits as directed by your health care provider. This is important.  Limit alcohol intake to no more than 1 drink per day for nonpregnant women and 2 drinks per day for men. One drink equals 12 ounces of beer, 5 ounces of wine, or 1 ounces of hard liquor. Drinking more than that is harmful to your heart. Tell your health care provider if you drink alcohol several times a week. Talk with your health care provider about whether alcohol is safe for you. If your heart has already been damaged by alcohol or you have severe heart failure, drinking alcohol should be stopped completely.  Stop illicit drug use.  Stay up-to-date with immunizations. It is especially important to prevent respiratory infections through current pneumococcal and influenza immunizations.  Manage other health conditions such as hypertension, diabetes, thyroid disease, or abnormal heart rhythms as directed by your health care provider.  Learn to manage stress.  Plan rest periods when fatigued.  Learn strategies to manage high temperatures. If the weather is extremely hot:  Avoid vigorous physical activity.  Use air conditioning or fans or seek a cooler location.  Avoid caffeine and alcohol.  Wear loose-fitting, lightweight, and light-colored clothing.  Learn strategies to manage cold temperatures. If the weather is extremely cold:  Avoid vigorous physical activity.  Layer clothes.  Wear mittens or gloves, a hat, and a scarf when going outside.  Avoid alcohol.  Obtain ongoing education and support as needed.  Participate in or seek rehabilitation as needed to maintain or improve independence and quality of life. SEEK MEDICAL CARE IF:   Your weight increases by 03 lb/1.4 kg in 1 day or 05 lb/2.3 kg in a week.  You have increasing shortness of breath that is unusual for you.  You are unable to participate in  your usual physical activities.  You tire easily.  You cough more than normal, especially with physical activity.  You have any or more swelling in areas such as your hands, feet, ankles, or abdomen.  You are unable to sleep because it is hard to breathe.  You feel like your heart is beating fast (palpitations).  You become dizzy or light-headed upon standing up. SEEK IMMEDIATE MEDICAL CARE IF:   You have difficulty breathing.  There is a change in mental status such as decreased alertness or difficulty with concentration.  You have a pain or discomfort in your chest.  You have an episode of fainting (syncope). MAKE SURE YOU:   Understand these instructions.  Will watch your condition.  Will get help right away if you are not doing well or get worse. Document Released: 08/23/2005 Document Revised: 01/07/2014 Document Reviewed: 09/22/2012 [facility] Patient Information 2015 [place], [place]. This information is not intended to replace advice given to you by your health care provider. Make sure you discuss any questions you have with your health care provider.  Apixaban oral tablets What is this medicine? APIXABAN (a PIX a ban) is an anticoagulant (blood thinner). It is used to lower the chance of stroke in people with a medical condition called atrial fibrillation. It  is also used to treat or prevent blood clots in the lungs or in the veins. This medicine may be used for other purposes; ask your health care provider or pharmacist if you have questions. COMMON BRAND NAME(S): Eliquis What should I tell my health care provider before I take this medicine? They need to know if you have any of these conditions: -bleeding disorders -bleeding in the brain -blood in your stools (black or tarry stools) or if you have blood in your vomit -history of stomach bleeding -kidney disease -liver disease -mechanical heart valve -an unusual or allergic reaction to apixaban, other medicines,  foods, dyes, or preservatives -pregnant or trying to get pregnant -breast-feeding How should I use this medicine? Take this medicine by mouth with a glass of water. Follow the directions on the prescription label. You can take it with or without food. If it upsets your stomach, take it with food. Take your medicine at regular intervals. Do not take it more often than directed. Do not stop taking except on your doctor's advice. Stopping this medicine may increase your risk of a blot clot. Be sure to refill your prescription before you run out of medicine. Talk to your pediatrician regarding the use of this medicine in children. Special care may be needed. Overdosage: If you think you have taken too much of this medicine contact a poison control center or emergency room at once. NOTE: This medicine is only for you. Do not share this medicine with others. What if I miss a dose? If you miss a dose, take it as soon as you can. If it is almost time for your next dose, take only that dose. Do not take double or extra doses. What may interact with this medicine? This medicine may interact with the following: -aspirin and aspirin-like medicines -certain medicines for fungal infections like ketoconazole and itraconazole -certain medicines for seizures like carbamazepine and phenytoin -certain medicines that treat or prevent blood clots like warfarin, enoxaparin, and dalteparin -clarithromycin -NSAIDs, medicines for pain and inflammation, like ibuprofen or naproxen -rifampin -ritonavir -St. John's wort This list may not describe all possible interactions. Give your health care provider a list of all the medicines, herbs, non-prescription drugs, or dietary supplements you use. Also tell them if you smoke, drink alcohol, or use illegal drugs. Some items may interact with your medicine. What should I watch for while using this medicine? Notify your doctor or health care professional and seek emergency  treatment if you develop breathing problems; changes in vision; chest pain; severe, sudden headache; pain, swelling, warmth in the leg; trouble speaking; sudden numbness or weakness of the face, arm, or leg. These can be signs that your condition has gotten worse. If you are going to have surgery, tell your doctor or health care professional that you are taking this medicine. Tell your health care professional that you use this medicine before you have a spinal or epidural procedure. Sometimes people who take this medicine have bleeding problems around the spine when they have a spinal or epidural procedure. This bleeding is very rare. If you have a spinal or epidural procedure while on this medicine, call your health care professional immediately if you have back pain, numbness or tingling (especially in your legs and feet), muscle weakness, paralysis, or loss of bladder or bowel control. Avoid sports and activities that might cause injury while you are using this medicine. Severe falls or injuries can cause unseen bleeding. Be careful when using sharp tools or knives.  Consider using an Neurosurgeon. Take special care brushing or flossing your teeth. Report any injuries, bruising, or red spots on the skin to your doctor or health care professional. What side effects may I notice from receiving this medicine? Side effects that you should report to your doctor or health care professional as soon as possible: -allergic reactions like skin rash, itching or hives, swelling of the face, lips, or tongue -signs and symptoms of bleeding such as bloody or black, tarry stools; red or dark-brown urine; spitting up blood or brown material that looks like coffee grounds; red spots on the skin; unusual bruising or bleeding from the eye, gums, or nose This list may not describe all possible side effects. Call your doctor for medical advice about side effects. You may report side effects to FDA at 1-800-FDA-1088. Where  should I keep my medicine? Keep out of the reach of children. Store at room temperature between 20 and 25 degrees C (68 and 77 degrees F). Throw away any unused medicine after the expiration date. NOTE: This sheet is a summary. It may not cover all possible information. If you have questions about this medicine, talk to your doctor, pharmacist, or health care provider.  2015, [employer]. (2013-04-27 11:59:24)  Information on my medicine - ELIQUIS (apixaban)  This medication education was reviewed with me or my healthcare representative as part of my discharge preparation.  The pharmacist that spoke with me during my hospital stay was:  Severiano Gilbert, [facility]  Why was Eliquis prescribed for you? Eliquis was prescribed to treat blood clots that may have been found in the veins of your legs (deep vein thrombosis) or in your lungs (pulmonary embolism) and to reduce the risk of them occurring again.  What do You need to know about Eliquis ? Take ONE 5 mg tablet taken TWICE daily.  Eliquis may be taken with or without food.   Try to take the dose about the same time in the morning and in the evening. If you have difficulty swallowing the tablet whole please discuss with your pharmacist how to take the medication safely.  Take Eliquis exactly as prescribed and DO NOT stop taking Eliquis without talking to the doctor who prescribed the medication.  Stopping may increase your risk of developing a new blood clot.  Refill your prescription before you run out.  After discharge, you should have regular check-up appointments with your healthcare provider that is prescribing your Eliquis.    What do you do if you miss a dose? If a dose of ELIQUIS is not taken at the scheduled time, take it as soon as possible on the same day and twice-daily administration should be resumed. The dose should not be doubled to make up for a missed dose.  Important Safety Information A possible side  effect of Eliquis is bleeding. You should call your healthcare provider right away if you experience any of the following: ? Bleeding from an injury or your nose that does not stop. ? Unusual colored urine (red or dark brown) or unusual colored stools (red or black). ? Unusual bruising for unknown reasons. ? A serious fall or if you hit your head (even if there is no bleeding).  Some medicines may interact with Eliquis and might increase your risk of bleeding or clotting while on Eliquis. To help avoid this, consult your healthcare provider or pharmacist prior to using any new prescription or non-prescription medications, including herbals, vitamins, non-steroidal anti-inflammatory drugs (NSAIDs) and supplements.  This website has more information on Eliquis (apixaban): [email or web address]://[email or web address].com/eliquis/home

## 2015-05-05 ENCOUNTER — Inpatient Hospital Stay (HOSPITAL_COMMUNITY): Payer: BLUE CROSS/BLUE SHIELD

## 2015-05-05 ENCOUNTER — Encounter (HOSPITAL_COMMUNITY): Payer: Self-pay | Admitting: Interventional Cardiology

## 2015-05-05 DIAGNOSIS — I509 Heart failure, unspecified: Secondary | ICD-10-CM

## 2015-05-05 DIAGNOSIS — I428 Other cardiomyopathies: Secondary | ICD-10-CM

## 2015-05-05 LAB — COMPREHENSIVE METABOLIC PANEL
ALBUMIN: 2.9 g/dL — AB (ref 3.5–5.0)
ALK PHOS: 76 U/L (ref 38–126)
ALT: 83 U/L — AB (ref 17–63)
AST: 27 U/L (ref 15–41)
Anion gap: 9 (ref 5–15)
BUN: 21 mg/dL — AB (ref 6–20)
CALCIUM: 8.3 mg/dL — AB (ref 8.9–10.3)
CO2: 25 mmol/L (ref 22–32)
CREATININE: 1.29 mg/dL — AB (ref 0.61–1.24)
Chloride: 98 mmol/L — ABNORMAL LOW (ref 101–111)
GFR calc Af Amer: >60 mL/min (ref >60)
GFR calc non Af Amer: 59 mL/min — ABNORMAL LOW (ref >60)
GLUCOSE: 114 mg/dL — AB (ref 65–99)
Potassium: 4 mmol/L (ref 3.5–5.1)
SODIUM: 132 mmol/L — AB (ref 135–145)
Total Bilirubin: 1.1 mg/dL (ref 0.3–1.2)
Total Protein: 5.5 g/dL — ABNORMAL LOW (ref 6.5–8.1)

## 2015-05-05 LAB — MAGNESIUM: Magnesium: 2 mg/dL (ref 1.7–2.4)

## 2015-05-05 MED ORDER — FUROSEMIDE 10 MG/ML IJ SOLN
20.0000 mg | Freq: Once | INTRAMUSCULAR | Status: AC
  Administered 2015-05-05: 20 mg via INTRAVENOUS
  Filled 2015-05-05: qty 2

## 2015-05-05 MED ORDER — FUROSEMIDE 10 MG/ML IJ SOLN
INTRAMUSCULAR | Status: AC
  Filled 2015-05-05: qty 2

## 2015-05-05 MED ORDER — FUROSEMIDE 40 MG PO TABS: 40.0000 mg | ORAL_TABLET | Freq: Every day | ORAL | Status: DC

## 2015-05-05 MED ORDER — RIVAROXABAN (XARELTO) VTE STARTER PACK (15 & 20 MG): ORAL_TABLET | ORAL | Status: DC

## 2015-05-05 MED ORDER — CARVEDILOL 3.125 MG PO TABS: 3.1250 mg | ORAL_TABLET | Freq: Two times a day (BID) | ORAL | Status: DC

## 2015-05-05 MED ORDER — GADOBENATE DIMEGLUMINE 529 MG/ML IV SOLN
23.0000 mL | Freq: Once | INTRAVENOUS | Status: AC | PRN
  Administered 2015-05-05: 23 mL via INTRAVENOUS

## 2015-05-05 MED ORDER — RIVAROXABAN (XARELTO) EDUCATION KIT FOR DVT/PE PATIENTS: 1.0000 | PACK | Freq: Once | Status: DC

## 2015-05-05 MED ORDER — MAGNESIUM OXIDE 400 (241.3 MG) MG PO TABS: 200.0000 mg | ORAL_TABLET | Freq: Every day | ORAL | Status: DC

## 2015-05-05 MED ORDER — RAMIPRIL 2.5 MG PO CAPS: 2.5000 mg | ORAL_CAPSULE | Freq: Every day | ORAL | Status: DC

## 2015-05-05 MED FILL — Nitroglycerin IV Soln 100 MCG/ML in D5W: INTRA_ARTERIAL | Qty: 10 | Status: AC

## 2015-05-05 NOTE — Progress Notes (Signed)
Pt has had runs of NSVT from 4 bts - 12 bts.  SBP remains in the 80's -90's.  Pt denies pain, pt does report shortness of breath when attempting to fall asleep, oxygen saturation on room air 96-100%, lungs diminished and clear throughout.  Will continue to monitor.

## 2015-05-05 NOTE — Progress Notes (Signed)
SATURATION QUALIFICATIONS: (This note is used to comply with regulatory documentation for home oxygen)  Patient Saturations on Room Air at Rest = 96% BP 92/66 HR 85  Patient Saturations on Room Air while Ambulating = 100%  BP 98/72 HR 92  Patient Saturations on 0 Liters of oxygen while Ambulating = 100%  Please briefly explain why patient needs home oxygen:

## 2015-05-05 NOTE — Progress Notes (Signed)
Patient Name: Marcus Bates Date of Encounter: 05/05/2015    Principal Problem:   Acute combined systolic and diastolic congestive heart failure Active Problems:   PE (pulmonary embolism)   Severe mitral regurgitation   LV (left ventricular) mural thrombus   Acute respiratory failure with hypoxia   NICM (nonischemic cardiomyopathy)   Abnormal LFTs   AKI (acute kidney injury)   SOB (shortness of breath)   Acute deep vein thrombosis (DVT) of popliteal vein of right lower extremity   Moderate persistent asthma in adult without complication   Cardiomegaly   NSVT (nonsustained ventricular tachycardia)   Renal insufficiency   Other specified hypotension    SUBJECTIVE  No dyspnea @ rest but continues to have orthopnea/pnd @ night. No c/p.  CURRENT MEDS . carvedilol  3.125 mg Oral BID WC  . clonazePAM  1 mg Oral QHS  . clonazePAM  1 mg Oral Once  . furosemide  40 mg Oral Daily  . gabapentin  300 mg Oral QHS  . magnesium oxide  200 mg Oral Daily  . mometasone-formoterol  2 puff Inhalation BID  . ramipril  2.5 mg Oral Daily  . rivaroxaban  15 mg Oral BID WC  . [START ON 05/25/2015] rivaroxaban  20 mg Oral Q supper  . sodium chloride  3 mL Intravenous Q12H  . sodium chloride  3 mL Intravenous Q12H    OBJECTIVE  Filed Vitals:   05/04/15 2131 05/05/15 0028 05/05/15 0051 05/05/15 0500  BP:  76/54 86/62 90/69   Pulse:    77  Temp:    97.7 F (36.5 C)  TempSrc:    Oral  Resp:      Height:      Weight:    151 lb 8 oz (68.72 kg)  SpO2: 96% 97%  97%    Intake/Output Summary (Last 24 hours) at 05/05/15 0814 Last data filed at 05/04/15 2000  Gross per 24 hour  Intake     60 ml  Output    375 ml  Net   -315 ml   Filed Weights   05/02/15 1229 05/04/15 0430 05/05/15 0500  Weight: 157 lb (71.215 kg) 154 lb 6.4 oz (70.035 kg) 151 lb 8 oz (68.72 kg)    PHYSICAL EXAM  General: Pleasant, NAD. Neuro: Alert and oriented X 3. Moves all extremities spontaneously. Psych: Normal  affect. HEENT:  Normal  Neck: Supple without bruits.  JVP to jaw. Lungs:  Resp regular and unlabored, CTA. Heart: RRR no s3, s4, or murmurs.  Lat displaced PMI. Abdomen: Soft, non-tender, non-distended, BS + x 4.  Extremities: No clubbing, cyanosis or edema. DP/PT/Radials 2+ and equal bilaterally.  Accessory Clinical Findings  CBC  Recent Labs  05/03/15 0217 05/04/15 0332  WBC 8.9 7.2  HGB 15.2 14.7  HCT 44.5 43.8  MCV 90.3 92.2  PLT 173 144*   Basic Metabolic Panel  Recent Labs  05/03/15 0217 05/04/15 0332 05/05/15 0239  NA 131*  --  132*  K 4.5  --  4.0  CL 98*  --  98*  CO2 22  --  25  GLUCOSE 144*  --  114*  BUN 25*  --  21*  CREATININE 1.29*  --  1.29*  CALCIUM 8.4*  --  8.3*  MG 2.1 1.9 2.0   Liver Function Tests  Recent Labs  05/03/15 0217 05/05/15 0239  AST 42* 27  ALT 129* 83*  ALKPHOS 90 76  BILITOT 1.3* 1.1  PROT 5.6* 5.5*  ALBUMIN 3.1* 2.9*   TELE  Rsr, freq pvc's.  Freq runs of NSVT - 3-13 beats.  Radiology/Studies  Ct Abdomen Pelvis W Contrast  04/30/2015   CLINICAL DATA:  Abnormal labs. Elevated AST and ALT. Right upper quadrant mass. Nausea and vomiting for several weeks.  EXAM: CT ABDOMEN AND PELVIS WITH CONTRAST  TECHNIQUE: Multidetector CT imaging of the abdomen and pelvis was performed using the standard protocol following bolus administration of intravenous contrast.  CONTRAST:  100 mL Omnipaque 300 IV  COMPARISON:  None.  FINDINGS: Lower chest: Multi chamber cardiomegaly. Small right effusion. Adjacent ground-glass opacities in the right lower lobe. There is an apparent filling defect within the right lower lobe pulmonary artery. Minimal linear atelectasis in the lingula.  Liver: Contrast refluxing into the IVC and hepatic veins. Liver appears normal in size. No evidence of focal hepatic lesion. No definite morphologic findings of cirrhosis. There is a small amount perihepatic ascites.  Hepatobiliary: Gallbladder physiologically  distended, questionable gallbladder wall thickening. No calcified gallstone. No biliary dilatation, common bile duct measures 6 mm at the porta hepatis.  Pancreas: Fatty atrophy of the head and uncinate process. No focal lesion. No ductal dilatation.  Spleen: Normal in size without focal lesion.  Adrenal glands: No nodule.  Kidneys: Symmetric renal enhancement. No hydronephrosis. No focal lesion.  Stomach/Bowel: Stomach physiologically distended. There are no dilated or thickened small bowel loops. Minimal thickening involving the ascending colon. Small volume of colonic stool. Distal diverticulosis without diverticulitis. The appendix is normal.  Vascular/Lymphatic: No retroperitoneal adenopathy. Abdominal aorta is normal in caliber. Moderate atherosclerosis of the abdominal aorta and its branches. No mesenteric or pelvic adenopathy.  Reproductive: Prostate gland is normal in size.  Bladder: Physiologically distended. Equivocal bladder wall thickening.  Other: Small amount of intra-abdominal ascites primarily in the perihepatic space, tracking in the right pericolic gutter and in the pelvis. No free air or loculated intra-abdominal fluid collection. Small fat containing umbilical hernia. There is mild whole body wall and mesenteric edema.  Musculoskeletal: There are no acute or suspicious osseous abnormalities. Degenerative change in the lumbar spine and at the pubic symphysis.  IMPRESSION: 1. No evidence of focal right upper quadrant or intra-abdominal mass. 2. Small right pleural effusion, small amount of intra-abdominal and pelvic ascites, and whole body wall edema. Findings may be related to liver dysfunction, however there are no CT morphologic findings of cirrhosis. There is multi chamber cardiomegaly, and right heart failure could produce a similar appearance. Suggestion of gallbladder wall thickening is likely related to systemic process. No biliary obstruction. 3. Incidental note of pulmonary embolus in a  segmental right lower lobe pulmonary artery. 4. Colonic diverticulosis without diverticulitis and atherosclerosis. Critical Value/emergent results were called by telephone at the time of interpretation on 04/30/2015 at 11:09 pm to PA JEFFREY HEDGES , who verbally acknowledged these results.   Electronically Signed   By: Rubye Oaks M.D.   On: 04/30/2015 23:10   Dg Chest Port 1 View  05/01/2015   CLINICAL DATA:  Shortness of breath.  EXAM: PORTABLE CHEST - 1 VIEW  COMPARISON:  CT abdomen/pelvis 1 day prior.  FINDINGS: The heart is enlarged. Mild vascular congestion without overt edema. Minimal atelectasis at the left lung base. No confluent airspace disease. The small right pleural effusion on prior CT is not seen on this portable AP view. No pneumothorax. No acute osseous abnormality.  IMPRESSION: Cardiomegaly with mild vascular congestion.   Electronically Signed   By: Lujean Rave.D.  On: 05/01/2015 02:46    ASSESSMENT AND PLAN  1.  Acute on systolic and diastolic CHF/NICM:  EF 10-15% by echo this admission.  Non-obstructive CAD by cath.  He denies dyspnea @ rest but continues to experience PND/orthopnea, which is upsetting to him.  He is minus 1.2 L since admission and weight is down 5 lbs since 8/25- currently 151 lbs, which he says is about 9 lbs less than what he would previously identify as his dry weight.  Lungs are relatively clear, abd soft, no edema.  He does have JVD to his jaw.  Echo did not show any significant TR to account for this.  His BUN/Creat/bicarb have been stable on lasix 40 po daily.  I will give a dose of IV lasix this AM.  BP has been soft - thus we will not be able to titrate bb/acei further.  Relative hypotension limits our ability to add spironolactone as well.  Don't think he'd tolerate entresto either.  May benefit from digoxin.  2.  Acute RLL PE/Acute R popliteal DVT:  On xarelto 15 mg bid (day three of 21).  Will need to change to 20 mg daily on 9/18.  3.   Severe MR:  Not currently felt to be a good candidate for MVR 2/2 severe LV dysfxn.  Plan to f/u echo in 3 mos.  4.  NSVT:  Freq pvc's and runs of NSVT.  Asymptomatic.  Cont low dose bb - soft BP prevents titration.  5.  Dispo:  Pt lives alone and has been working at [employer] with no A/C.  Work is not particularly heavy exertion but environment unlikely to be hospitable given severe LV dysfxn.  His family is in the room with him this AM and have concerns about his ability to return to work both in the short and long terms.  They've asked to see a Engineer, civil (consulting) to better understand the disability process.  They also have concerns about his ability to transport himself to and from f/u office visits as he does not apparently have a reliable car.  Signed, Nicolasa Ducking NP Agree with above assessment. With his severe LV dysfunction and embolic events his ability to return to work is very questionable and he may need to pursue disability. Hopefully his EF will improve over time but with his soft BP titration of meds will need to be very gradual. Will avoid adding digoxin at this time.  Still having frequent PVCs.  Social worker to see. Pulmonary following. His oxygen saturations will be monitored overnight to see if he needs home oxygen. Exam reveals elevated neck veins.  I do not hear a murmur of MR. Hopefully much of the severe MR seen on initial echo is secondary to severe LV dysfunction and should therefore improve if/when his EF improves.

## 2015-05-05 NOTE — Care Management Note (Addendum)
Case Management Note  Patient Details  Name: Marcus Bates MRN: [id number] Date of Birth: [birth year]  Subjective/Objective: Pt admitted for Acute combined systolic and diastolic CHF. Pt is from home alone. Family is from out of town. Pt unsure if he will be able to return to work once stable.                    Action/Plan: CM did call [facility] to see if pt would be eligible for Disability. CM will place information on AVS for Social Services and Social Security. Pt works in a hot environment and feels like he will not be able to continue. Pt has 30 day free xarelto card and year supply card. [facility] has medication available. CSW to assist with transportation needs. Pt's car is not working right now. In the shop getting repaired. CM will continue to monitor. Question if will need oxygen for home.    Expected Discharge Date:  05/03/15               Expected Discharge Plan:  Home/Self Care  In-House Referral:     Discharge planning Services  CM Consult  Post Acute Care Choice:  NA Choice offered to:  NA  DME Arranged:    DME Agency:     HH Arranged:  NA HH Agency:     Status of Service:  In process, will continue to follow  Medicare Important Message Given:    Date Medicare IM Given:    Medicare IM give by:    Date Additional Medicare IM Given:    Additional Medicare Important Message give by:     If discussed at Long Length of Stay Meetings, dates discussed:    Additional Comments: 1500 05-07-15 Tomi Bamberger, RN,BSN  [phone] CM did speak with pt this am in reference to personal care services. Pt was concerned about getting grocery since he has no family or friends in town. Staff RN and CM did make pt aware of ordering groceries online ex: [employer] and pt can pick up or use the [facility] Service list provided for a fee out of pocket. Per staff RN plan is for cardiac cath today. Per MD notes: He has new onset severe NICM with severe biventricular dysfunction on echo. Plan  to move pt to ICU and place [facility] today to further guide management. Question if he will need home inotropes. Social support system is poor. Family in [place]. CM will continue to monitor for disposition needs.   [address], RN,BSN [phone] Called [employer] [employer] at [phone]. Talked to CSR Erin. XARELTO is covered. No Prior Authorization required. Patient will have a $91.14 retail pharmacy co-payment.  Gala Lewandowsky, RN 05/05/2015, 2:50 PM

## 2015-05-05 NOTE — Progress Notes (Signed)
[facility] PROGRESS NOTE  Marcus Bates CVE:[id number] DOB: [birth year] DOA: 04/30/2015 PCP: Lolita Patella, MD   Brief narrative 59 year old male was admitted to [facility] on 8/25 with elevated LFTs and symptoms of increased dyspnea with exertion for past 1 month along with lower extremity swelling. On admission he had a CT of his abdomen and pelvis which showed anasarca along with cardiomegaly and incidental PE in this segmental right lower lobe pulmonary artery. 2-D echo done showed a depressed EF of 10-15% with grade 3 diastolic dysfunction. Patient transferred to [facility] for cardiac cath which showed nonobstructive disease.  Assessment/Plan: Acute systolic and diastolic CHF with nonischemic cardiomyopathy Cardiac cath shows nonobstructive disease. Currently on Lasix 40 mg by mouth daily. Added low dose Coreg and ramipril. Blood pressure is low. Plan on adding Aldactone once blood pressure permits. Has severe mitral regurgitation on echo. Discussed with cardiology and EP today. Do not recommend TEE and role for valve repair at this time given severe regurgitation of unknown duration. -Recommend to repeat a 2-D echo in 3 months to evaluate his heart function.  Severe MR Possibly Secondary to cardiomyopathy. No role for TEE and valve replacement at this time as it will not improve his heart function.  Acute DVT with right sided PE Was started on IV heparin. Switch to oral Xarelto on 8/28. Plan is for indefinite anticoagulation given his on prolonged venous thromboembolic is in an severe cardiomyopathy and severe MR.  Severe MR Possibly Secondary to cardiomyopathy. No role for TEE and valve replacement at this time as it will not improve his heart function.  Increasing dyspnea during the night with? Apnea Patient reports that he has been waking up during feeling suffocated and gasping for air. Sats recorded to be normal.Was followed by Dr. Maple Hudson for excessive  sleepiness and had sleep study in 04/2014 consistent with nonspecific hypersomnia and ruled out for sleep apnea. -ABG unremarkable. I think this is related to orthopnea and PND. Keep head of bed elevated at 30 at all times. -Appreciate pulmonary recommendations. Plan is to check nocturnal polysomnography, O2 sat at rest and ambulation.  Nonsustained V. tach Evaluated by EP. Plan to repeat echo in 3 months for consideration of ICD. No indication for antiarrhythmics or LifeVest at this time. -Has multiple PVCs on the monitor. Asymptomatic. Unable to increase beta blocker dose due to hypotension. Potassium and Magnesium normal.     Hypotension Possibly associated with severe CHF. Patient is asymptomatic. On Coreg, Lasix and low-dose ACE inhibitor. Unable to titrate dose further up.     Transaminitis Possibly hepatic ischemia with CHF. Liver and gallbladder on admission CT unremarkable. LFTs improving.   DVT prophylaxis: will start patient on Xarelto.   diet: Heart healthy  Code Status: full code Family Communication none at bedside Disposition: Plan to check nocturnal 02 sat tonight. Received additional dose of Lasix this morning.. Home possibly tomorrow.   Consultants:  Cardiology   EP  Pulmonary  Procedures:  2D echo  cardiac cath  Antibiotics:  none  HPI/Subjective: Patient seen and examined. Reports being able to sleep for 6 hours after the head of bed was elevated. Objective: Filed Vitals:   05/05/15 1300  BP: 88/65  Pulse:   Temp:   Resp:     Intake/Output Summary (Last 24 hours) at 05/05/15 1337 Last data filed at 05/05/15 1115  Gross per 24 hour  Intake    300 ml  Output    375 ml  Net    -  75 ml   Filed Weights   05/02/15 1229 05/04/15 0430 05/05/15 0500  Weight: 71.215 kg (157 lb) 70.035 kg (154 lb 6.4 oz) 68.72 kg (151 lb 8 oz)    Exam:   General:  Middle aged male in no acute distress  HEENT: No pallor, moist mucosa, JVD  +  Cardiovascular: Normal S1 and S2, no murmur  Respiratory: Clear bilaterally  Abdomen: Soft, nondistended, nontender, bowel sounds present  Musculoskeletal: Warm, no edema    Data Reviewed: Basic Metabolic Panel:  Recent Labs Lab 04/30/15 1733 05/01/15 0215 05/01/15 1255 05/02/15 0512 05/03/15 0217 05/04/15 0332 05/05/15 0239  NA 136 135  --  135 131*  --  132*  K 4.9 4.4  --  3.9 4.5  --  4.0  CL 100* 104  --  100* 98*  --  98*  CO2 24 23  --  26 22  --  25  GLUCOSE 95 128*  --  109* 144*  --  114*  BUN 19 20  --  23* 25*  --  21*  CREATININE 1.27* 1.26*  --  1.26* 1.29*  --  1.29*  CALCIUM 9.1 8.6*  --  8.3* 8.4*  --  8.3*  MG  --   --  1.9 2.0 2.1 1.9 2.0   Liver Function Tests:  Recent Labs Lab 04/30/15 1733 05/01/15 0215 05/02/15 0512 05/03/15 0217 05/05/15 0239  AST 120* 90* 50* 42* 27  ALT 272* 225* 161* 129* 83*  ALKPHOS 100 87 88 90 76  BILITOT 1.8* 1.7* 1.7* 1.3* 1.1  PROT 6.5 5.7* 5.4* 5.6* 5.5*  ALBUMIN 4.0 3.4* 3.3* 3.1* 2.9*    Recent Labs Lab 04/30/15 1733  LIPASE 23   No results for input(s): AMMONIA in the last 168 hours. CBC:  Recent Labs Lab 04/30/15 1733 05/01/15 0215 05/02/15 0512 05/03/15 0217 05/04/15 0332  WBC 8.1 9.3 9.4 8.9 7.2  NEUTROABS  --  7.3  --   --   --   HGB 16.1 14.6 14.6 15.2 14.7  HCT 48.5 45.1 43.5 44.5 43.8  MCV 92.7 92.2 91.0 90.3 92.2  PLT 200 168 154 173 144*   Cardiac Enzymes:  Recent Labs Lab 05/01/15 0215 05/01/15 0710 05/01/15 1255  TROPONINI <0.03 0.03 0.03   BNP (last 3 results)  Recent Labs  05/01/15 0215  BNP 2662.2*    ProBNP (last 3 results) No results for input(s): PROBNP in the last 8760 hours.  CBG: No results for input(s): GLUCAP in the last 168 hours.  No results found for this or any previous visit (from the past 240 hour(s)).   Studies: No results found.  Scheduled Meds: . carvedilol  3.125 mg Oral BID WC  . clonazePAM  1 mg Oral QHS  . clonazePAM  1 mg  Oral Once  . furosemide      . furosemide  20 mg Intravenous Once  . furosemide  40 mg Oral Daily  . gabapentin  300 mg Oral QHS  . magnesium oxide  200 mg Oral Daily  . mometasone-formoterol  2 puff Inhalation BID  . ramipril  2.5 mg Oral Daily  . rivaroxaban  15 mg Oral BID WC  . [START ON 05/25/2015] rivaroxaban  20 mg Oral Q supper  . sodium chloride  3 mL Intravenous Q12H  . sodium chloride  3 mL Intravenous Q12H   Continuous Infusions:      Time spent: 25 minutes    Mikesha Migliaccio  [facility] Pager [phone] If 7PM-7AM, please contact night-coverage at [email or web address], password [facility] 05/05/2015, 1:37 PM  LOS: 5 days

## 2015-05-05 NOTE — Progress Notes (Signed)
Name: Marcus Bates MRN: [id number] DOB: July 15, 1956    ADMISSION DATE:  04/30/2015 CONSULTATION DATE:  05/05/2015  REFERRING MD :   TRH Dhungel  CHIEF COMPLAINT:   Nocturnal dyspnea  BRIEF PATIENT DESCRIPTION:   59 year old male has had progressive dyspnea particularly nocturnal and chest tightness over the past several months.  Patient had associated nausea as well with exertional shortness of breath. Patient was found to have abnormal liver function tests and a primary care office and this necessitated transfer over to the emergency room on 05/01/2015. Because of the primary symptom complex of lower extremity edema and nausea CT abdomen and vascular to venous Doppler extremity studies were performed. Abdominal CT scan showed no acute liver process but mesenteric and abdominal wall edema with four-chamber cardiomegaly. There is also right lower extremity deep venous thrombosis in the popliteal vein seen and a right lower lobe pulmonary embolism seen on CT abdomen. The patient was admitted for further evaluation. Cardiac catheterization did not reveal coronary artery disease. No previous sleep study done in the past year showed no sleep apnea. The patient has not been on supplement oxygen since prior to admission.  With treatment of heart failure and diuresis the patient has improved. This still remains on supplemental oxygen.  PAST  NPSG 04/14/98-AHI 0/ hr, Periodic limb movement, mild snoring PSG 03/25/14- Daytime study- AHI 0.9/ hr, mild limb jerks, fragmented sleep with frequent awakening, total sleep time 253.5 minutes, 19.3% REM. MSLT 03/26/14- Done after daytime PSG, mean latency 6.4 min, no SOREM/ 5 naps. Pattern consistent with nonspecific hypersomnia with studies timed to be similar to his usual sleep-wake pattern.  CURRENT SIGNIFICANT EVENTS  And STUDIES:  CT of abdomen showed incidental finding of right lower lobe pulmonary embolism and associated groundglass opacities in right  lower lobe  Vascular Doppler study on 05/01/2015 revealed acute deep venous thrombosis in the right popliteal vein right lower extremity  Cardiac catheterization on 05/02/2015 revealed no coronary artery disease  Echocardiogram on 05/01/2015 revealed ejection fraction 10-15% global hypokinesis there was significant mitral regurgitation and also dilation of the right ventricle and right atrium   SUBJECTIVE:  05/05/2015 d/w Cards APP and RN - he is improved. Denies acute issues. D/w patient and daughter at bedside - improved but still with paroxysmal noctural dyspnea esp when flat and some orthopnea. Daughter asking for o2 and cpap. Denies active wheeze or cough   VITAL SIGNS: Temp:  [97.7 F (36.5 C)-98.2 F (36.8 C)] 97.7 F (36.5 C) (08/29 0500) Pulse Rate:  [77-80] 80 (08/29 0800) Resp:  [18] 18 (08/28 1457) BP: (76-96)/(54-76) 96/76 mmHg (08/29 0800) SpO2:  [95 %-97 %] 97 % (08/29 0500) Weight:  [68.72 kg (151 lb 8 oz)] 68.72 kg (151 lb 8 oz) (08/29 0500)  PHYSICAL EXAMINATION: General: looking ok -appearing male in no acute distress Neuro:  Nonfocal HEENT:  Jugular venous distention noted Cardiovascular:  Regular rate and rhythm normal heart sounds Lungs:  Decreased breath sounds rales the right base Abdomen:  Soft nontender no mass Musculoskeletal:  Full range of motion Skin:  Clear    PULMONARY  Recent Labs Lab 05/04/15 1200  PHART 7.535*  PCO2ART 24.8*  PO2ART 109*  HCO3 20.9  TCO2 21.6  O2SAT 98.4    CBC  Recent Labs Lab 05/02/15 0512 05/03/15 0217 05/04/15 0332  HGB 14.6 15.2 14.7  HCT 43.5 44.5 43.8  WBC 9.4 8.9 7.2  PLT 154 173 144*    COAGULATION  Recent Labs Lab 04/30/15 2146  INR 1.34    CARDIAC   Recent Labs Lab 05/01/15 0215 05/01/15 0710 05/01/15 1255  TROPONINI <0.03 0.03 0.03   No results for input(s): PROBNP in the last 168 hours.   CHEMISTRY  Recent Labs Lab 04/30/15 1733 05/01/15 0215 05/01/15 1255  05/02/15 0512 05/03/15 0217 05/04/15 0332 05/05/15 0239  NA 136 135  --  135 131*  --  132*  K 4.9 4.4  --  3.9 4.5  --  4.0  CL 100* 104  --  100* 98*  --  98*  CO2 24 23  --  26 22  --  25  GLUCOSE 95 128*  --  109* 144*  --  114*  BUN 19 20  --  23* 25*  --  21*  CREATININE 1.27* 1.26*  --  1.26* 1.29*  --  1.29*  CALCIUM 9.1 8.6*  --  8.3* 8.4*  --  8.3*  MG  --   --  1.9 2.0 2.1 1.9 2.0   Estimated Creatinine Clearance: 59.9 mL/min (by C-G formula based on Cr of 1.29).   LIVER  Recent Labs Lab 04/30/15 1733 04/30/15 2146 05/01/15 0215 05/02/15 0512 05/03/15 0217 05/05/15 0239  AST 120*  --  90* 50* 42* 27  ALT 272*  --  225* 161* 129* 83*  ALKPHOS 100  --  87 88 90 76  BILITOT 1.8*  --  1.7* 1.7* 1.3* 1.1  PROT 6.5  --  5.7* 5.4* 5.6* 5.5*  ALBUMIN 4.0  --  3.4* 3.3* 3.1* 2.9*  INR  --  1.34  --   --   --   --      INFECTIOUS No results for input(s): LATICACIDVEN, PROCALCITON in the last 168 hours.   ENDOCRINE CBG (last 3)  No results for input(s): GLUCAP in the last 72 hours.       IMAGING x48h  - image(s) personally visualized  -   highlighted in bold No results found.      ASSESSMENT / PLAN:  #1  acute hypoxemic respiratory failure on the basis of acute heart failure systolic nonischemic. Associated right lower lobe pulmonary embolism and deep venous thrombosis as well.   - improved 05/05/2015 but subjective orthopne and pnd still persist - likely cardiac medaited but patient and family anxious about o2 levels and need for cpap Rx    REC  - baseed on  Chart review -   There is no evidence of sleep apnea. There is no indication for C Pap therapy.  Can be reassessed if needed by Dr Maple Hudson   - symptoms likely due to Acute systolic CHF - advised to sleep with HOB elevated   - test for hypoxemia with overnight pulse ox and amb pulse ox 05/05/2015 - d/w RN and RRT   - Continuie xarelto   - given his comorbid factors  Probably  lifelong  anticoagulation as this was an unprovoked venous    thrombosis and a malewith associated significant cardiomyopathy and mural    thrombus and ventricle      #2 moderate persistent asthma with the patient on Advair chronically at home  Recommendations   Dulera to continue   Dr. Kalman Shan, M.D., [facility].C.P Pulmonary and Critical Care Medicine Staff Physician [facility] Pager: [phone], If no answer or between  15:00h - 7:00h: call [phone]  05/05/2015 9:49 AM

## 2015-05-05 NOTE — Progress Notes (Signed)
Nursing Student accidentally gave Altace 2.5 mg which is a once a day medication at 1745. This RN called Dunn, PA on call to notify and to see if any new orders. PA verbal order to hold pm dose of Coreg, and check BP q15 minutes for the next several hours and keep her informed.   I personally spoke with patient to let him know about the medication error, that the PA has been notified, and we will monitor him closely. Patient current BP 93/66. HR 79.   Safety Zone in the process of being completed.  Will continue to monitor closely.

## 2015-05-06 ENCOUNTER — Inpatient Hospital Stay (HOSPITAL_COMMUNITY): Payer: BLUE CROSS/BLUE SHIELD

## 2015-05-06 DIAGNOSIS — R0602 Shortness of breath: Secondary | ICD-10-CM

## 2015-05-06 LAB — BASIC METABOLIC PANEL
ANION GAP: 9 (ref 5–15)
BUN: 20 mg/dL (ref 6–20)
CHLORIDE: 101 mmol/L (ref 101–111)
CO2: 23 mmol/L (ref 22–32)
Calcium: 8.5 mg/dL — ABNORMAL LOW (ref 8.9–10.3)
Creatinine, Ser: 1.09 mg/dL (ref 0.61–1.24)
GFR calc non Af Amer: >60 mL/min (ref >60)
Glucose, Bld: 106 mg/dL — ABNORMAL HIGH (ref 65–99)
POTASSIUM: 4 mmol/L (ref 3.5–5.1)
Sodium: 133 mmol/L — ABNORMAL LOW (ref 135–145)

## 2015-05-06 MED ORDER — FUROSEMIDE 10 MG/ML IJ SOLN
20.0000 mg | Freq: Once | INTRAMUSCULAR | Status: AC
  Administered 2015-05-06: 20 mg via INTRAVENOUS
  Filled 2015-05-06: qty 2

## 2015-05-06 MED ORDER — FUROSEMIDE 10 MG/ML IJ SOLN: 40.0000 mg | Freq: Once | INTRAMUSCULAR | Status: DC

## 2015-05-06 MED ORDER — LIVING BETTER WITH HEART FAILURE BOOK
Freq: Once | Status: AC
  Administered 2015-05-06: 08:00:00

## 2015-05-06 MED ORDER — DIGOXIN 250 MCG PO TABS
0.2500 mg | ORAL_TABLET | Freq: Every day | ORAL | Status: DC
  Administered 2015-05-06 – 2015-05-16 (×11): 0.25 mg via ORAL
  Filled 2015-05-06 (×6): qty 1
  Filled 2015-05-06: qty 2
  Filled 2015-05-06 (×5): qty 1

## 2015-05-06 MED ORDER — METOPROLOL TARTRATE 12.5 MG HALF TABLET
12.5000 mg | ORAL_TABLET | Freq: Two times a day (BID) | ORAL | Status: DC
  Administered 2015-05-06 – 2015-05-07 (×2): 12.5 mg via ORAL
  Filled 2015-05-06 (×2): qty 1

## 2015-05-06 NOTE — Progress Notes (Signed)
Sp02 99% @ 2108 Room Air Sp02 94% @ 0150 Room Air Sp02 99% @ 0605 28% Venti Mask

## 2015-05-06 NOTE — Progress Notes (Addendum)
[facility] PROGRESS NOTE  Marcus Bates XLK:[id number] DOB: [birth year] DOA: 04/30/2015 PCP: Lolita Patella, MD   Brief narrative 59 year old male was admitted to [facility] on 8/25 with elevated LFTs and symptoms of increased dyspnea with exertion for past 1 month along with lower extremity swelling. On admission he had a CT of his abdomen and pelvis which showed anasarca along with cardiomegaly and incidental PE in this segmental right lower lobe pulmonary artery. 2-D echo done showed a depressed EF of 10-15% with grade 3 diastolic dysfunction. Patient transferred to [facility] for cardiac cath which showed nonobstructive disease.  Assessment/Plan: Acute systolic and diastolic CHF with nonischemic cardiomyopathy Cardiac cath shows nonobstructive disease. Currently on Lasix 40 mg by mouth daily. Added low dose Coreg and ramipril.given low BP struggling with escalating on dosing. Has severe mitral regurgitation on echo.  cardiology and EP do not recommend TEE and role for valve repair at this time given severe regurgitation of unknown duration. -Cardiac MRI shows severe biventricular dilatation and impaired function. No LV thrombus seen. -Recommend to repeat a 2-D echo in 3 months to evaluate his heart function. continue lasix, coreg switched to metoprolol, low dose ramipril. Cannot start aldactone due to low BP.added digoxin today.   Increasing dyspnea during the night with? Apnea Patient reports  waking up during feeling suffocated and gasping for air. Sats recorded to be normal.Was followed by Dr. Maple Hudson for excessive sleepiness and had sleep study in 04/2014 consistent with nonspecific hypersomnia and ruled out for sleep apnea. -ABG unremarkable. Possibly  related to orthopnea and PND. Keep head of bed elevated at 30 at all times. -no o2 desaturation at rest or ambulation. No nocturnal o2 desat. Sniff test to negative for diaphragmatic muscle denervation . Follow  results. -pulmonary recommends continous overnight pulse oximeter check at home.  Severe MR Possibly Secondary to cardiomyopathy. No role for TEE and valve replacement at this time as it will not improve his heart function.  Acute DVT with right sided PE Was started on IV heparin. Switched to oral Xarelto on 8/28. Plan is for indefinite anticoagulation given his on prolonged venous thromboembolic is in an severe cardiomyopathy and severe MR.  Severe MR Possibly Secondary to cardiomyopathy. No role for TEE and valve replacement at this time as it will not improve his heart function.   Nonsustained V. tach Evaluated by EP. Plan to repeat echo in 3 months for consideration of ICD. No indication for antiarrhythmics or LifeVest at this time. -Has multiple PVCs on the monitor. Asymptomatic. Unable to increase beta blocker dose due to hypotension. Switch coreg to metoprolol. Potassium and Magnesium normal.   Hypotension Possibly associated with severe CHF. Patient is asymptomatic. Coreg switched to metoprolol, continue Lasix and low-dose ACE inhibitor. Unable to titrate dose up. Added digoxin.   Transaminitis Possibly hepatic ischemia with CHF. Liver and gallbladder on admission CT unremarkable. LFTs improving.   DVT prophylaxis:  Xarelto.   diet: Heart healthy   Code Status: full code Family Communication: parents at bedside Disposition:home possibly tomorrow if apnea and BP improves.    Consultants:  Cardiology   EP  Pulmonary  Procedures:  2D echo  cardiac cath  Antibiotics:  none  HPI/Subjective: Patient seen and examined. Still having nocturnal apnea. No o2 desat during the night.   Objective: Filed Vitals:   05/06/15 0950  BP: 87/71  Pulse:   Temp:   Resp: 17    Intake/Output Summary (Last 24 hours) at 05/06/15 1218 Last data filed  at 05/06/15 1209  Gross per 24 hour  Intake    153 ml  Output    500 ml  Net   -347 ml   Filed Weights   05/05/15  0500 05/06/15 0502 05/06/15 1209  Weight: 68.72 kg (151 lb 8 oz) 71.759 kg (158 lb 3.2 oz) 69.355 kg (152 lb 14.4 oz)    Exam:   General:  Middle aged male in no acute distress  HEENT: No pallor, moist mucosa, JVD +  Cardiovascular: Normal S1 and S2, no murmur  Respiratory: Clear bilaterally  Abdomen: Soft, nondistended, nontender, bowel sounds present  Musculoskeletal: Warm, no edema    Data Reviewed: Basic Metabolic Panel:  Recent Labs Lab 05/01/15 0215 05/01/15 1255 05/02/15 0512 05/03/15 0217 05/04/15 0332 05/05/15 0239 05/06/15 0500  NA 135  --  135 131*  --  132* 133*  K 4.4  --  3.9 4.5  --  4.0 4.0  CL 104  --  100* 98*  --  98* 101  CO2 23  --  26 22  --  25 23  GLUCOSE 128*  --  109* 144*  --  114* 106*  BUN 20  --  23* 25*  --  21* 20  CREATININE 1.26*  --  1.26* 1.29*  --  1.29* 1.09  CALCIUM 8.6*  --  8.3* 8.4*  --  8.3* 8.5*  MG  --  1.9 2.0 2.1 1.9 2.0  --    Liver Function Tests:  Recent Labs Lab 04/30/15 1733 05/01/15 0215 05/02/15 0512 05/03/15 0217 05/05/15 0239  AST 120* 90* 50* 42* 27  ALT 272* 225* 161* 129* 83*  ALKPHOS 100 87 88 90 76  BILITOT 1.8* 1.7* 1.7* 1.3* 1.1  PROT 6.5 5.7* 5.4* 5.6* 5.5*  ALBUMIN 4.0 3.4* 3.3* 3.1* 2.9*    Recent Labs Lab 04/30/15 1733  LIPASE 23   No results for input(s): AMMONIA in the last 168 hours. CBC:  Recent Labs Lab 04/30/15 1733 05/01/15 0215 05/02/15 0512 05/03/15 0217 05/04/15 0332  WBC 8.1 9.3 9.4 8.9 7.2  NEUTROABS  --  7.3  --   --   --   HGB 16.1 14.6 14.6 15.2 14.7  HCT 48.5 45.1 43.5 44.5 43.8  MCV 92.7 92.2 91.0 90.3 92.2  PLT 200 168 154 173 144*   Cardiac Enzymes:  Recent Labs Lab 05/01/15 0215 05/01/15 0710 05/01/15 1255  TROPONINI <0.03 0.03 0.03   BNP (last 3 results)  Recent Labs  05/01/15 0215  BNP 2662.2*    ProBNP (last 3 results) No results for input(s): PROBNP in the last 8760 hours.  CBG: No results for input(s): GLUCAP in the last 168  hours.  No results found for this or any previous visit (from the past 240 hour(s)).   Studies: Mr Card Morphology Wo/w Cm  05/05/2015   CLINICAL DATA:  59 year old male with new diagnosis of non-ischemic cardiomyopathy. Evaluate for possible inflammatory and infiltrative diseases.  EXAM: CARDIAC MRI  TECHNIQUE: The patient was scanned on a 1.5 Tesla GE magnet. A dedicated cardiac coil was used. Functional imaging was done using Fiesta sequences. 2,3, and 4 chamber views were done to assess for RWMA's. Modified Simpson's rule using a short axis stack was used to calculate an ejection fraction on a dedicated work [employer]. The patient received 23 cc of Multihance. After 10 minutes inversion recovery sequences were used to assess for infiltration and scar tissue.  CONTRAST:  23  cc  of Multihance  FINDINGS: 1. Severely dilated left ventricle with normal wall thickness and severely impaired systolic function (LVEF = 13%).  There is diffuse hypokinesis and paradoxical septal motion.  LVEDD:  76 mm  LVESD:  69 mm  LVEDV:  343 ml  LVESV:  298 ml  SV:  45 ml  CO:  3.5 L/minute  Myocardial mass 196 g  2. Severely dilated right ventricle with normal wall thickness and severely impaired systolic function (RVEF = 12%) with diffuse hypokinesis.  3. Severely dilated left atrium and moderately dilated right atrium.  4.  Severe mitral and moderate tricuspid regurgitation.  5. Normal size of the aortic root and ascending aorta. Dilated main pulmonary artery consistent with pulmonary hypertension.  6. There is diffuse mid wall late gadolinium enhancement in the interventricular septum, anterolateral walls and in the RV myocardium.  7.  No thrombus was seen in the left ventricle.  8. There is severe swirling of the blood in the right ventricle suspicious of the pre-thrombotic state.  IMPRESSION: 1. Severely dilated left and right ventricle with severe biventricular systolic impairment.  Diffuse mid wall late  gadolinium enhancement in the myocardium of the left and right ventricle and in the RV attachment points to the LV consistent with dilated cardiomyopathy with biventricular involvement and decompensated heart failure.  There is no evidence of inflammatory or infiltrative cardiomyopathy.  2. Severely dilated left atrium and moderately dilated right atrium.  3. Severe mitral and moderate tricuspid regurgitation.  4. Dilated main pulmonary artery consistent with pulmonary hypertension. There is severe swirling of the blood in the right ventricle suspicious of the pre-thrombotic state.  Marcus Bates   Electronically Signed   By: Marcus Bates   On: 05/05/2015 19:47    Scheduled Meds: . clonazePAM  1 mg Oral QHS  . clonazePAM  1 mg Oral Once  . digoxin  0.25 mg Oral Daily  . furosemide  40 mg Oral Daily  . gabapentin  300 mg Oral QHS  . magnesium oxide  200 mg Oral Daily  . metoprolol tartrate  12.5 mg Oral BID  . mometasone-formoterol  2 puff Inhalation BID  . ramipril  2.5 mg Oral Daily  . rivaroxaban  15 mg Oral BID WC  . [START ON 05/25/2015] rivaroxaban  20 mg Oral Q supper  . sodium chloride  3 mL Intravenous Q12H  . sodium chloride  3 mL Intravenous Q12H   Continuous Infusions:      Time spent: 25 minutes    Marcus Bates  [facility] Pager [phone] If 7PM-7AM, please contact night-coverage at [email or web address], password [facility] 05/06/2015, 12:18 PM  LOS: 6 days

## 2015-05-06 NOTE — Progress Notes (Signed)
Pt called out complaint of feeling worse than yesterday and is having trouble breathing.  Temp 97.7, Pulse 70, B/P 84/63, O2 95% RA, respirations 95 RA.  I gave pt PRN albuterol treatment with some relief.  Dr. Gonzella Lex notified and orders received to give 20 of IV lasix if BP comes above 90 systolic.  Will continue to closely monitor.

## 2015-05-06 NOTE — Progress Notes (Signed)
Heart Failure Navigator Consult Note  Presentation: Marcus Bates is a 59 y.o. male with history of asthma was referred to the ER after patient was found to have abnormal LFTs. Patient states over the last 1 month patient has been having exertional shortness of breath with nausea. Patient had lab work done in his PCPs office which showed abnormal LFTs and was referred to the ER. Patient states that he has been having increasing wheezing recently and last month as Advair Diskus dose was increased. Patient also has noticed some lower extremity swelling recently. Last month he noticed in the right lower extremity and this week he noticed on the left lower extremity. CT abdomen and pelvis done in the ER was showing nothing acute in the liver area but showed abdominal wall and mesenteric edema with 4 chambers cardiomegaly. In addition to also showed pulmonary embolism. On exam patient also has lower extremity edema more on the left side. LFTs are mildly elevated. Patient does not have any fever or chills or productive cough. Denies any chest pain. Patient's symptoms are more concerning for CHF at this time.   Past Medical History  Diagnosis Date  . Asthma   . Anxiety   . Depression   . Insomnia   . Restless leg syndrome   . Severe mitral insufficiency     a. 04/2015 Echo: Sev MR.  Marland Kitchen NICM (nonischemic cardiomyopathy)     a. 04/2015 Echo: EF 10-15%, sev diff HK.  . Chronic combined systolic and diastolic CHF (congestive heart failure)     a. 04/2015 Echo: EF 10-15%, sev diff HK, Gr 3 DD, sev MR, sev dil LA, dilated RV, mod dil RA.  . Non-obstructive CAD     a. 04/2015 Cath: LM nl, LAD 4m, LCX 20ost, RCA 20d.  . Acute deep vein thrombosis (DVT) of popliteal vein of right lower extremity     a. 04/2015 -> Xarelto  . Pulmonary embolism on right     a. 04/2015 -> Xarelto  . NSVT (nonsustained ventricular tachycardia)   . LV (left ventricular) mural thrombus     a. 04/2015 Echo: possible laminated echodense  area on inferior wall, cannot exclude laminated thrombus.    Social History   Social History  . Marital Status: Single    Spouse Name: N/A  . Number of Children: N/A  . Years of Education: N/A   Occupational History  .      Tools   Social History Main Topics  . Smoking status: Never Smoker   . Smokeless tobacco: Never Used  . Alcohol Use: No  . Drug Use: No  . Sexual Activity: Not Asked   Other Topics Concern  . None   Social History Narrative    ECHO:Study Conclusions-05/01/15  - Left ventricle: Possible laminated echodense area on inferior wall, cannot exclude laminated thrombus. The cavity size was moderately dilated. Systolic function was normal. The estimated ejection fraction was in the range of 10% to 15%. Severe diffuse hypokinesis. Although no diagnostic regional wall motion abnormality was identified, this possibility cannot be completely excluded on the basis of this study. Doppler parameters are consistent with a reversible restrictive pattern, indicative of decreased left ventricular diastolic compliance and/or increased left atrial pressure (grade 3 diastolic dysfunction). - Mitral valve: There was severe regurgitation. - Left atrium: The atrium was severely dilated. - Right ventricle: The cavity size was dilated. Wall thickness was normal. - Right atrium: The atrium was moderately dilated.  Transthoracic echocardiography. M-mode, complete 2D, spectral  Doppler, and color Doppler. Birthdate: Patient birthdate: 31-Mar-1956. Age: Patient is 59 yr old. Sex: Gender: male. BMI: 25.7 kg/m^2. Blood pressure:   113/81 Patient status: Inpatient. Study date: Study date: 05/01/2015. Study time: 10:27 AM. Location: Bedside.  BNP    Component Value Date/Time   BNP 2662.2* 05/01/2015 0215    ProBNP No results found for: PROBNP   Education Assessment and Provision:  Detailed education and instructions provided on heart  failure disease management including the following:  Signs and symptoms of Heart Failure When to call the physician Importance of daily weights Low sodium diet Fluid restriction Medication management Anticipated future follow-up appointments  Patient education given on each of the above topics.  Patient acknowledges understanding and acceptance of all instructions.  I spoke at length with patient and his parents regarding his HF.  He has not been seeing a cardiologist outpatient prior to this admission.  He has a scale and says that prior to admission was not weighing daily.  I discussed the importance of daily weights and when to call the physician related to signs and symptoms.  I reinforced a low sodium diet and high sodium foods to avoid.  He tells me that medication affordability may be an issue going forward.  He is quite concerned about returning to work.  He does not currently have a functioning automobile as well as no one local to provide support.  His parents live in [place].  Education Materials:  "Living Better With Heart Failure" Booklet, Daily Weight Tracker Tool and Heart Failure Educational Video.   High Risk Criteria for Readmission and/or Poor Patient Outcomes:  (Recommend Follow-up with Advanced [facility] Failure Clinic)--yes   EF <30%- Yes 10-15% with grade 3 dias dys.  2 or more admissions in 6 months-No  Difficult social situation- Yes- lives alone with no local support  Demonstrates medication noncompliance- denies -however does say that it affordability could be issue going forward    Barriers of Care:  Knowledge and compliance  Discharge Planning:   Plans to return to home alone

## 2015-05-06 NOTE — Progress Notes (Addendum)
Name: Marcus Bates MRN: [id number] DOB: 1955/10/17    ADMISSION DATE:  04/30/2015 CONSULTATION DATE:  05/06/2015  REFERRING MD :   TRH Dhungel  CHIEF COMPLAINT:   Nocturnal dyspnea  BRIEF PATIENT DESCRIPTION:   59 year old male has had progressive dyspnea particularly nocturnal and chest tightness over the past several months.  Patient had associated nausea as well with exertional shortness of breath. Patient was found to have abnormal liver function tests and a primary care office and this necessitated transfer over to the emergency room on 05/01/2015. Because of the primary symptom complex of lower extremity edema and nausea CT abdomen and vascular to venous Doppler extremity studies were performed. Abdominal CT scan showed no acute liver process but mesenteric and abdominal wall edema with four-chamber cardiomegaly. There is also right lower extremity deep venous thrombosis in the popliteal vein seen and a right lower lobe pulmonary embolism seen on CT abdomen. The patient was admitted for further evaluation. Cardiac catheterization did not reveal coronary artery disease. No previous sleep study done in the past year showed no sleep apnea. The patient has not been on supplement oxygen since prior to admission.  With treatment of heart failure and diuresis the patient has improved. This still remains on supplemental oxygen.  PAST  NPSG 04/14/98-AHI 0/ hr, Periodic limb movement, mild snoring PSG 03/25/14- Daytime study- AHI 0.9/ hr, mild limb jerks, fragmented sleep with frequent awakening, total sleep time 253.5 minutes, 19.3% REM. MSLT 03/26/14- Done after daytime PSG, mean latency 6.4 min, no SOREM/ 5 naps. Pattern consistent with nonspecific hypersomnia with studies timed to be similar to his usual sleep-wake pattern.  CURRENT SIGNIFICANT EVENTS  And STUDIES:  CT of abdomen showed incidental finding of right lower lobe pulmonary embolism and associated groundglass opacities in right  lower lobe  Vascular Doppler study on 05/01/2015 revealed acute deep venous thrombosis in the right popliteal vein right lower extremity  Cardiac catheterization on 05/02/2015 revealed no coronary artery disease  Echocardiogram on 05/01/2015 revealed ejection fraction 10-15% global hypokinesis there was significant mitral regurgitation and also dilation of the right ventricle and right atrium  05/05/2015 d/w Cards APP and RN - he is improved. Denies acute issues. D/w patient and daughter at bedside - improved but still with paroxysmal noctural dyspnea esp when flat and some orthopnea. Daughter asking for o2 and cpap. Denies active wheeze or cough    SUBJECTIVE/OVERNIGHT/INTERVAL HX 05/06/2015 : Stil having paroxysmal nocturanl dyspnea esp when lying flat. Very worried that it would kill him. Amb pulse ox and spot check at night multiple times when he was sleeping (per chart review) do not show desaturations. He is NOT reassured by normal oxygen or explanation that CHF could be contributing to symptoms. Seems to want o2 for symptom relief. Reports feeling smothered withour O2. Feels sense of doom. Ass. Fatigue +. However, agrees that he is lot better since admission. Not feeling safe to go home though   VITAL SIGNS: Temp:  [97.7 F (36.5 C)-98.3 F (36.8 C)] 97.7 F (36.5 C) (08/30 0502) Pulse Rate:  [67-84] 75 (08/30 0721) Resp:  [12-24] 14 (08/30 0721) BP: (70-98)/(44-72) 93/57 mmHg (08/30 0721) SpO2:  [94 %-100 %] 97 % (08/30 0721) FiO2 (%):  [28 %] 28 % (08/30 0605) Weight:  [158 lb 3.2 oz (71.759 kg)] 158 lb 3.2 oz (71.759 kg) (08/30 0502)  PHYSICAL EXAMINATION: General: looking ok -appearing male in no acute distress Neuro:  Nonfocal. Oriented x 3 Psych: flat and mildly anxious HEENT:  Jugular venous distention noted Cardiovascular:  Regular rate and rhythm normal heart sounds Lungs:  Decreased breath sounds rales the right base Abdomen:  Soft nontender no mass Musculoskeletal:   Full range of motion Skin:  Clear    PULMONARY  Recent Labs Lab 05/04/15 1200  PHART 7.535*  PCO2ART 24.8*  PO2ART 109*  HCO3 20.9  TCO2 21.6  O2SAT 98.4    CBC  Recent Labs Lab 05/02/15 0512 05/03/15 0217 05/04/15 0332  HGB 14.6 15.2 14.7  HCT 43.5 44.5 43.8  WBC 9.4 8.9 7.2  PLT 154 173 144*    COAGULATION  Recent Labs Lab 04/30/15 2146  INR 1.34    CARDIAC    Recent Labs Lab 05/01/15 0215 05/01/15 0710 05/01/15 1255  TROPONINI <0.03 0.03 0.03   No results for input(s): PROBNP in the last 168 hours.   CHEMISTRY  Recent Labs Lab 05/01/15 0215 05/01/15 1255 05/02/15 0512 05/03/15 0217 05/04/15 0332 05/05/15 0239 05/06/15 0500  NA 135  --  135 131*  --  132* 133*  K 4.4  --  3.9 4.5  --  4.0 4.0  CL 104  --  100* 98*  --  98* 101  CO2 23  --  26 22  --  25 23  GLUCOSE 128*  --  109* 144*  --  114* 106*  BUN 20  --  23* 25*  --  21* 20  CREATININE 1.26*  --  1.26* 1.29*  --  1.29* 1.09  CALCIUM 8.6*  --  8.3* 8.4*  --  8.3* 8.5*  MG  --  1.9 2.0 2.1 1.9 2.0  --    Estimated Creatinine Clearance: 71.8 mL/min (by C-G formula based on Cr of 1.09).   LIVER  Recent Labs Lab 04/30/15 1733 04/30/15 2146 05/01/15 0215 05/02/15 0512 05/03/15 0217 05/05/15 0239  AST 120*  --  90* 50* 42* 27  ALT 272*  --  225* 161* 129* 83*  ALKPHOS 100  --  87 88 90 76  BILITOT 1.8*  --  1.7* 1.7* 1.3* 1.1  PROT 6.5  --  5.7* 5.4* 5.6* 5.5*  ALBUMIN 4.0  --  3.4* 3.3* 3.1* 2.9*  INR  --  1.34  --   --   --   --      INFECTIOUS No results for input(s): LATICACIDVEN, PROCALCITON in the last 168 hours.   ENDOCRINE CBG (last 3)  No results for input(s): GLUCAP in the last 72 hours.       IMAGING x48h  - image(s) NOT personally visualized Mr Card Morphology Wo/w Cm  05/05/2015   CLINICAL DATA:  59 year old male with new diagnosis of non-ischemic cardiomyopathy. Evaluate for possible inflammatory and infiltrative diseases.  EXAM: CARDIAC  MRI  TECHNIQUE: The patient was scanned on a 1.5 Tesla GE magnet. A dedicated cardiac coil was used. Functional imaging was done using Fiesta sequences. 2,3, and 4 chamber views were done to assess for RWMA's. Modified Simpson's rule using a short axis stack was used to calculate an ejection fraction on a dedicated work [employer]. The patient received 23 cc of Multihance. After 10 minutes inversion recovery sequences were used to assess for infiltration and scar tissue.  CONTRAST:  23 cc  of Multihance  FINDINGS: 1. Severely dilated left ventricle with normal wall thickness and severely impaired systolic function (LVEF = 13%).  There is diffuse hypokinesis and paradoxical septal motion.  LVEDD:  76 mm  LVESD:  69 mm  LVEDV:  343 ml  LVESV:  298 ml  SV:  45 ml  CO:  3.5 L/minute  Myocardial mass 196 g  2. Severely dilated right ventricle with normal wall thickness and severely impaired systolic function (RVEF = 12%) with diffuse hypokinesis.  3. Severely dilated left atrium and moderately dilated right atrium.  4.  Severe mitral and moderate tricuspid regurgitation.  5. Normal size of the aortic root and ascending aorta. Dilated main pulmonary artery consistent with pulmonary hypertension.  6. There is diffuse mid wall late gadolinium enhancement in the interventricular septum, anterolateral walls and in the RV myocardium.  7.  No thrombus was seen in the left ventricle.  8. There is severe swirling of the blood in the right ventricle suspicious of the pre-thrombotic state.  IMPRESSION: 1. Severely dilated left and right ventricle with severe biventricular systolic impairment.  Diffuse mid wall late gadolinium enhancement in the myocardium of the left and right ventricle and in the RV attachment points to the LV consistent with dilated cardiomyopathy with biventricular involvement and decompensated heart failure.  There is no evidence of inflammatory or infiltrative cardiomyopathy.  2. Severely  dilated left atrium and moderately dilated right atrium.  3. Severe mitral and moderate tricuspid regurgitation.  4. Dilated main pulmonary artery consistent with pulmonary hypertension. There is severe swirling of the blood in the right ventricle suspicious of the pre-thrombotic state.  Tobias Alexander   Electronically Signed   By: Tobias Alexander   On: 05/05/2015 19:47        ASSESSMENT / PLAN:  #1  acute hypoxemic respiratory failure on the basis of acute heart failure systolic nonischemic. Associated right lower lobe pulmonary embolism and deep venous thrombosis as well.   -  subjective orthopne and pnd still persist  And is OVERWHELMING him subjectively. No evidence of desaturations with walking or spot check at night when sleeping- likely cardiac medaited but GERD could be playing a role - he denies Vs Diaph paralysis Vs active asthma (he reports this as stable and well controlled. Sleep apnea was ruled out bby Dr Maple Hudson last year    REC  - symptoms likely due to Acute systolic CHF - advised to sleep with HOB elevated   -CHECK SNIFF TEST for DIaph issues - in house   - Continue xarelto   - given his comorbid factors  Probably  lifelong anticoagulation as this was an unprovoked venous thrombosis and a malewith associated significant cardiomyopathy and mural thrombus and ventricle   - sleep with HOB elevated   - advised him to buy home pulse oximeter for monitoring o2 levels    - might need continous overnight pulse oximeter check at dc - arranged through DME company   - at this point does NOT qualify for O2 Rx - I explained this to him several times - he seemed dis-satisfied with this fact       #2 moderate persistent asthma with the patient on Advair chronically at home  Recommendations   Dulera to continue      Dr. Kalman Shan, M.D., [facility].C.P Pulmonary and Critical Care Medicine Staff Physician [facility] [place] [facility] Pager: [phone], If no answer or between  15:00h - 7:00h: call [phone]  05/06/2015 9:28 AM

## 2015-05-06 NOTE — Progress Notes (Signed)
Patient Name: Marcus Bates Date of Encounter: 05/06/2015     Principal Problem:   Acute combined systolic and diastolic congestive heart failure Active Problems:   PE (pulmonary embolism)   Severe mitral regurgitation   LV (left ventricular) mural thrombus   Acute respiratory failure with hypoxia   NICM (nonischemic cardiomyopathy)   Abnormal LFTs   AKI (acute kidney injury)   SOB (shortness of breath)   Acute deep vein thrombosis (DVT) of popliteal vein of right lower extremity   Moderate persistent asthma in adult without complication   Cardiomegaly   NSVT (nonsustained ventricular tachycardia)   Renal insufficiency   Other specified hypotension    SUBJECTIVE  BP was soft yesterday.  Only received lasix 20 IV x 1 - did not get usual PO dose.  PM dose of coreg held 2/2 low bp.  Ambulated yesterday w/o significant dyspnea and stable O2 sats.  Did feel "wobbly" after getting up.  Slept better last night w/ HOB elevated but tried to lower HOB this AM and again became dyspneic.  Cont to c/o orthopnea.  No desaturations noted.  CURRENT MEDS . carvedilol  3.125 mg Oral BID WC  . clonazePAM  1 mg Oral QHS  . clonazePAM  1 mg Oral Once  . furosemide  40 mg Oral Daily  . gabapentin  300 mg Oral QHS  . magnesium oxide  200 mg Oral Daily  . mometasone-formoterol  2 puff Inhalation BID  . ramipril  2.5 mg Oral Daily  . rivaroxaban  15 mg Oral BID WC  . [START ON 05/25/2015] rivaroxaban  20 mg Oral Q supper  . sodium chloride  3 mL Intravenous Q12H  . sodium chloride  3 mL Intravenous Q12H    OBJECTIVE  Filed Vitals:   05/06/15 0621 05/06/15 0719 05/06/15 0721 05/06/15 0950  BP: 87/71  Pulse: 75 77 75   Temp:      TempSrc:      Resp: Height:      Weight:      SpO2: 95% 98% 97%     Intake/Output Summary (Last 24 hours) at 05/06/15 1011 Last data filed at 05/05/15 2118  Gross per 24 hour  Intake    393 ml  Output    250 ml  Net    143  ml   Filed Weights   05/04/15 0430 05/05/15 0500 05/06/15 0502  Weight: 154 lb 6.4 oz (70.035 kg) 151 lb 8 oz (68.72 kg) 158 lb 3.2 oz (71.759 kg)    PHYSICAL EXAM  General: Pleasant, NAD. Neuro: Alert and oriented X 3. Moves all extremities spontaneously. Psych: Normal affect. HEENT:  Normal  Neck: Supple without bruits>  JVD ~ 14 cm. Lungs:  Resp regular and unlabored, bibasilar crackles. Heart: RRR no s3, s4, or murmurs. Lat displaced PMI. Abdomen: Soft, non-tender, non-distended, BS + x 4.  Extremities: No clubbing, cyanosis or edema. DP/PT/Radials 2+ and equal bilaterally.  Accessory Clinical Findings  CBC  Recent Labs  05/04/15 0332  WBC 7.2  HGB 14.7  HCT 43.8  MCV 92.2  PLT 144*   Basic Metabolic Panel  Recent Labs  05/04/15 0332 05/05/15 0239 05/06/15 0500  NA  --  132* 133*  K  --  4.0 4.0  CL  --  98* 101  CO2  --  25 23  GLUCOSE  --  114* 106*  BUN  --  21* 20  CREATININE  --  1.29* 1.09  CALCIUM  --  8.3* 8.5*  MG 1.9 2.0  --    Liver Function Tests  Recent Labs  05/05/15 0239  AST 27  ALT 83*  ALKPHOS 76  BILITOT 1.1  PROT 5.5*  ALBUMIN 2.9*   TELE  Rsr, freq pvc's/couplets, NSVT 3 22 beats.  ASSESSMENT AND PLAN  1. Acute on systolic and diastolic CHF/NICM: EF 10-15% by echo this admission. Non-obstructive CAD by cath. He denies dyspnea @ rest but continues to experience PND/orthopnea, which is upsetting to him. He is minus 1.0 L since admission.  Diuresis now limited by soft BP's.  Wt recorded as up 7 lbs this AM (higher than admission weight).  Wt was performed on bed scale.  He has evidence of mild volume overload on exam w/ bibasilar crackles, JVD, and some abd distension.  His BP limited lasix dosing yesterday and resulted in coreg being held last night.  He did received PO lasix this AM. I will switch him from carvedilol to metoprolol to hopefully allow for more BP room.  If he tolerates this, we can consolidate to toprol xl  tomorrow.  If BP remains soft, we will likely have to d/c acei.  Given NSVT, continuing bb is of utmost importance.  Hypotension limits our ability to add spironolactone/switch ACEI. May benefit from digoxin - will add.  Follow up digoxin level in a few days (likely @ outpt f/u).  Renal fxn stable.  2. Acute RLL PE/Acute R popliteal DVT: On xarelto 15 mg bid (day three of 21). Will need to change to 20 mg daily on 9/18.  3. Severe MR: Not currently felt to be a good candidate for MVR 2/2 severe LV dysfxn. Plan to f/u echo in 3 mos.  4. NSVT: Freq pvc's and runs of NSVT (high of 22 in past 24 hrs). Asymptomatic. Cont bb and switch to metoprolol as above - in hopes of improved BP.  Signed, Nicolasa Ducking NP Agree with above assessment. His sniff test today showed normal diaphragmatic movement. He is sleeping with his head higher and is having fewer episodes of "smothering".  Continues to have frequent PACs and PVCs with runs of nonsustained VT asymptomatic. Agree with trial of digoxin.

## 2015-05-07 ENCOUNTER — Encounter (HOSPITAL_COMMUNITY)
Admission: EM | Disposition: A | Discharge status: Home Health | Payer: Self-pay | Source: Home / Self Care | Attending: Internal Medicine

## 2015-05-07 DIAGNOSIS — J4540 Moderate persistent asthma, uncomplicated: Secondary | ICD-10-CM

## 2015-05-07 DIAGNOSIS — J9601 Acute respiratory failure with hypoxia: Secondary | ICD-10-CM

## 2015-05-07 HISTORY — PX: CARDIAC CATHETERIZATION: SHX172

## 2015-05-07 LAB — BASIC METABOLIC PANEL
Anion gap: 7 (ref 5–15)
BUN: 20 mg/dL (ref 6–20)
CHLORIDE: 98 mmol/L — AB (ref 101–111)
CO2: 30 mmol/L (ref 22–32)
CREATININE: 1.15 mg/dL (ref 0.61–1.24)
Calcium: 9 mg/dL (ref 8.9–10.3)
GFR calc Af Amer: >60 mL/min (ref >60)
GFR calc non Af Amer: >60 mL/min (ref >60)
Glucose, Bld: 81 mg/dL (ref 65–99)
Potassium: 4.5 mmol/L (ref 3.5–5.1)
SODIUM: 135 mmol/L (ref 135–145)

## 2015-05-07 LAB — CBC
HEMATOCRIT: 49.1 % (ref 39.0–52.0)
HEMOGLOBIN: 16.2 g/dL (ref 13.0–17.0)
MCH: 30.7 pg (ref 26.0–34.0)
MCHC: 33 g/dL (ref 30.0–36.0)
MCV: 93.2 fL (ref 78.0–100.0)
Platelets: 197 10*3/uL (ref 150–400)
RBC: 5.27 MIL/uL (ref 4.22–5.81)
RDW: 15.5 % (ref 11.5–15.5)
WBC: 7.8 10*3/uL (ref 4.0–10.5)

## 2015-05-07 LAB — POCT I-STAT 3, VENOUS BLOOD GAS (G3P V)
ACID-BASE EXCESS: 2 mmol/L (ref 0.0–2.0)
ACID-BASE EXCESS: 3 mmol/L — AB (ref 0.0–2.0)
Bicarbonate: 26.9 mEq/L — ABNORMAL HIGH (ref 20.0–24.0)
Bicarbonate: 27.8 mEq/L — ABNORMAL HIGH (ref 20.0–24.0)
O2 SAT: 54 %
O2 Saturation: 57 %
PH VEN: 7.412 — AB (ref 7.250–7.300)
TCO2: 29 mmol/L (ref 0–100)
TCO2: 28 mmol/L (ref 0–100)
pCO2, Ven: 42.2 mmHg — ABNORMAL LOW (ref 45.0–50.0)
pCO2, Ven: 42.4 mmHg — ABNORMAL LOW (ref 45.0–50.0)
pH, Ven: 7.426 — ABNORMAL HIGH (ref 7.250–7.300)
pO2, Ven: 28 mmHg — CL (ref 30.0–45.0)
pO2, Ven: 29 mmHg — CL (ref 30.0–45.0)

## 2015-05-07 LAB — PROTIME-INR
INR: 2.7 — ABNORMAL HIGH (ref 0.00–1.49)
PROTHROMBIN TIME: 28.3 s — AB (ref 11.6–15.2)

## 2015-05-07 LAB — CARBOXYHEMOGLOBIN
CARBOXYHEMOGLOBIN: 1.3 % (ref 0.5–1.5)
Methemoglobin: 1.3 % (ref 0.0–1.5)
O2 SAT: 65 %
TOTAL HEMOGLOBIN: 14 g/dL (ref 13.5–18.0)

## 2015-05-07 SURGERY — RIGHT HEART CATH

## 2015-05-07 MED ORDER — SODIUM CHLORIDE 0.9 % IJ SOLN
3.0000 mL | Freq: Two times a day (BID) | INTRAMUSCULAR | Status: DC
  Administered 2015-05-08 – 2015-05-10 (×3): 3 mL via INTRAVENOUS

## 2015-05-07 MED ORDER — LIDOCAINE HCL (PF) 1 % IJ SOLN
INTRAMUSCULAR | Status: DC | PRN
  Administered 2015-05-07: 15:00:00

## 2015-05-07 MED ORDER — ASPIRIN 81 MG PO CHEW: 81.0000 mg | CHEWABLE_TABLET | ORAL | Status: DC

## 2015-05-07 MED ORDER — FUROSEMIDE 10 MG/ML IJ SOLN
INTRAMUSCULAR | Status: AC
  Filled 2015-05-07: qty 8

## 2015-05-07 MED ORDER — SODIUM CHLORIDE 0.9 % IV SOLN
250.0000 mL | INTRAVENOUS | Status: DC | PRN
  Administered 2015-05-07: 10 mL via INTRAVENOUS

## 2015-05-07 MED ORDER — SODIUM CHLORIDE 0.9 % IV SOLN
INTRAVENOUS | Status: DC | PRN
  Administered 2015-05-07: 10 mL/h via INTRAVENOUS

## 2015-05-07 MED ORDER — MILRINONE IN DEXTROSE 20 MG/100ML IV SOLN
0.2500 ug/kg/min | INTRAVENOUS | Status: DC
  Administered 2015-05-07 – 2015-05-09 (×3): 0.25 ug/kg/min via INTRAVENOUS
  Filled 2015-05-07 (×2): qty 100

## 2015-05-07 MED ORDER — SODIUM CHLORIDE 0.9 % IJ SOLN
3.0000 mL | Freq: Two times a day (BID) | INTRAMUSCULAR | Status: DC
  Administered 2015-05-07: 3 mL via INTRAVENOUS

## 2015-05-07 MED ORDER — SODIUM CHLORIDE 0.9 % IJ SOLN: 3.0000 mL | INTRAMUSCULAR | Status: DC | PRN

## 2015-05-07 MED ORDER — SODIUM CHLORIDE 0.9 % IV SOLN
250.0000 mL | INTRAVENOUS | Status: DC | PRN
  Administered 2015-05-07: 250 mL via INTRAVENOUS

## 2015-05-07 MED ORDER — ONDANSETRON HCL 4 MG/2ML IJ SOLN: 4.0000 mg | Freq: Four times a day (QID) | INTRAMUSCULAR | Status: DC | PRN

## 2015-05-07 MED ORDER — SODIUM CHLORIDE 0.9 % IJ SOLN
3.0000 mL | Freq: Two times a day (BID) | INTRAMUSCULAR | Status: DC
  Administered 2015-05-08: 3 mL via INTRAVENOUS

## 2015-05-07 MED ORDER — FUROSEMIDE 10 MG/ML IJ SOLN
80.0000 mg | Freq: Two times a day (BID) | INTRAMUSCULAR | Status: DC
  Administered 2015-05-07: 80 mg via INTRAVENOUS
  Filled 2015-05-07: qty 8

## 2015-05-07 MED ORDER — SODIUM CHLORIDE 0.9 % IV SOLN: 250.0000 mL | INTRAVENOUS | Status: DC | PRN

## 2015-05-07 MED ORDER — HEPARIN (PORCINE) IN NACL 100-0.45 UNIT/ML-% IJ SOLN
1250.0000 [IU]/h | INTRAMUSCULAR | Status: DC
  Administered 2015-05-07: 1400 [IU]/h via INTRAVENOUS
  Administered 2015-05-08: 1300 [IU]/h via INTRAVENOUS
  Administered 2015-05-09: 1400 [IU]/h via INTRAVENOUS
  Filled 2015-05-07 (×4): qty 250

## 2015-05-07 MED ORDER — ACETAMINOPHEN 325 MG PO TABS
650.0000 mg | ORAL_TABLET | ORAL | Status: DC | PRN
Start: 2015-05-07 — End: 2015-05-16

## 2015-05-07 MED ORDER — SODIUM CHLORIDE 0.9 % IV SOLN: INTRAVENOUS | Status: DC

## 2015-05-07 SURGICAL SUPPLY — 9 items
CATH SWAN VIP NON-HEP 7.5F (CATHETERS) ×1 IMPLANT
KIT HEART LEFT (KITS) ×2 IMPLANT
KIT HEART RIGHT NAMIC (KITS) ×2 IMPLANT
PACK CARDIAC CATHETERIZATION (CUSTOM PROCEDURE TRAY) ×2 IMPLANT
SHEATH PINNACLE 7F 10CM (SHEATH) IMPLANT
SHEATH PINNACLE 8F 10CM (SHEATH) ×1 IMPLANT
SLEEVE REPOSITIONING LENGTH 30 (MISCELLANEOUS) ×1 IMPLANT
TRANSDUCER W/STOPCOCK (MISCELLANEOUS) ×3 IMPLANT
WIRE EMERALD 3MM-J .025X260CM (WIRE) ×1 IMPLANT

## 2015-05-07 NOTE — H&P (View-Only) (Signed)
Advanced Heart Failure Rounding Note   Subjective:    Admitted with increased nausea and dyspnea. Cardiology consulted 8/25 newly reduced LV funciton, NSVT, and MR. Had acute PE/DVT. Has been treated with IV lasix and started on BB. Medication regimen limited by hypotension. He has not been on ace or spiro with hypotension.   Today he is complaining of fatigue. + Orthopnea. Mild dyspnea.    8/25 ECHO EF 10% RV dilated  8/25 + Acute DVT distal to mild popliteal vein RLE No DVT on Left  8/26 LHC -  1. Prox RCA to Dist RCA lesion, 20% stenosed. 2. Ost Cx to Dist Cx lesion, 20% stenosed. 3. Mid LAD lesion, 25% stenosed.  FH: Fatherl--> CHF    Objective:   Weight Range:  Vital Signs:   Temp:  [97.7 F (36.5 C)-98.2 F (36.8 C)] 97.9 F (36.6 C) (08/31 0454) Pulse Rate:  [74-84] 74 (08/31 0935) Resp:  [18-20] 18 (08/31 0454) BP: (84-103)/(54-73) 90/54 mmHg (08/31 0935) SpO2:  [95 %-100 %] 96 % (08/31 0935) Weight:  [151 lb 3.2 oz (68.584 kg)-152 lb 14.4 oz (69.355 kg)] 151 lb 3.2 oz (68.584 kg) (08/31 0454) Last BM Date: 05/07/15  Weight change: Filed Weights   05/06/15 0502 05/06/15 1209 05/07/15 0454  Weight: 158 lb 3.2 oz (71.759 kg) 152 lb 14.4 oz (69.355 kg) 151 lb 3.2 oz (68.584 kg)    Intake/Output:   Intake/Output Summary (Last 24 hours) at 05/07/15 1026 Last data filed at 05/07/15 0900  Gross per 24 hour  Intake    360 ml  Output    800 ml  Net   -440 ml     Physical Exam: General:  Fatigued appearing. No resp difficulty. Sitting in the chair.  HEENT: normal Neck: supple. JVP 8-9  . Carotids 2+ bilat; no bruits. No lymphadenopathy or thryomegaly appreciated. Cor: PMI nondisplaced. Regular rate & rhythm. No rubs, or murmurs. + s3  Lungs: clear Abdomen: soft, nontender, + distended. No hepatosplenomegaly. No bruits or masses. Good bowel sounds. Extremities: no cyanosis, clubbing, rash, trace lower extremity edema.  Neuro: alert & orientedx3, cranial nerves  grossly intact. moves all 4 extremities w/o difficulty. Affect pleasant  Telemetry: SR 80s   Labs: Basic Metabolic Panel:  Recent Labs Lab 05/01/15 0215 05/01/15 1255 05/02/15 0512 05/03/15 0217 05/04/15 0332 05/05/15 0239 05/06/15 0500  NA 135  --  135 131*  --  132* 133*  K 4.4  --  3.9 4.5  --  4.0 4.0  CL 104  --  100* 98*  --  98* 101  CO2 23  --  26 22  --  25 23  GLUCOSE 128*  --  109* 144*  --  114* 106*  BUN 20  --  23* 25*  --  21* 20  CREATININE 1.26*  --  1.26* 1.29*  --  1.29* 1.09  CALCIUM 8.6*  --  8.3* 8.4*  --  8.3* 8.5*  MG  --  1.9 2.0 2.1 1.9 2.0  --     Liver Function Tests:  Recent Labs Lab 04/30/15 1733 05/01/15 0215 05/02/15 0512 05/03/15 0217 05/05/15 0239  AST 120* 90* 50* 42* 27  ALT 272* 225* 161* 129* 83*  ALKPHOS 100 87 88 90 76  BILITOT 1.8* 1.7* 1.7* 1.3* 1.1  PROT 6.5 5.7* 5.4* 5.6* 5.5*  ALBUMIN 4.0 3.4* 3.3* 3.1* 2.9*    Recent Labs Lab 04/30/15 1733  LIPASE 23   No results for input(s):  AMMONIA in the last 168 hours.  CBC:  Recent Labs Lab 04/30/15 1733 05/01/15 0215 05/02/15 0512 05/03/15 0217 05/04/15 0332  WBC 8.1 9.3 9.4 8.9 7.2  NEUTROABS  --  7.3  --   --   --   HGB 16.1 14.6 14.6 15.2 14.7  HCT 48.5 45.1 43.5 44.5 43.8  MCV 92.7 92.2 91.0 90.3 92.2  PLT 200 168 154 173 144*    Cardiac Enzymes:  Recent Labs Lab 05/01/15 0215 05/01/15 0710 05/01/15 1255  TROPONINI <0.03 0.03 0.03    BNP: BNP (last 3 results)  Recent Labs  05/01/15 0215  BNP 2662.2*    ProBNP (last 3 results) No results for input(s): PROBNP in the last 8760 hours.    Other results:  Imaging: Dg Sniff Test  05/06/2015   CLINICAL DATA:  Interrupted sleeping with gasping for air and snoring.  EXAM: CHEST FLUOROSCOPY -SNIFF TEST  TECHNIQUE: Real-time fluoroscopic evaluation of the chest was performed.  FLUOROSCOPY TIME:  0 MINUTES, 6 SECONDS  COMPARISON:  None.  FINDINGS: Small right pleural effusion with blunting of  the right costophrenic angle. Mild cardiomegaly.  With sniff maneuver, both diaphragms move down in the expected fashion.  IMPRESSION: 1. Normal diaphragmatic motion with sniff maneuver; no findings of paradoxical movement to suggest diaphragmatic denervation. 2. Small right pleural effusion. 3. Mild cardiomegaly.   Electronically Signed   By: Gaylyn Rong M.D.   On: 05/06/2015 12:21   Mr Card Morphology Wo/w Cm  05/05/2015   CLINICAL DATA:  59 year old male with new diagnosis of non-ischemic cardiomyopathy. Evaluate for possible inflammatory and infiltrative diseases.  EXAM: CARDIAC MRI  TECHNIQUE: The patient was scanned on a 1.5 Tesla GE magnet. A dedicated cardiac coil was used. Functional imaging was done using Fiesta sequences. 2,3, and 4 chamber views were done to assess for RWMA's. Modified Simpson's rule using a short axis stack was used to calculate an ejection fraction on a dedicated work [employer], [employer]. The patient received 23 cc of Multihance. After 10 minutes inversion recovery sequences were used to assess for infiltration and scar tissue.  CONTRAST:  23 cc  of Multihance  FINDINGS: 1. Severely dilated left ventricle with normal wall thickness and severely impaired systolic function (LVEF = 13%).  There is diffuse hypokinesis and paradoxical septal motion.  LVEDD:  76 mm  LVESD:  69 mm  LVEDV:  343 ml  LVESV:  298 ml  SV:  45 ml  CO:  3.5 L/minute  Myocardial mass 196 g  2. Severely dilated right ventricle with normal wall thickness and severely impaired systolic function (RVEF = 12%) with diffuse hypokinesis.  3. Severely dilated left atrium and moderately dilated right atrium.  4.  Severe mitral and moderate tricuspid regurgitation.  5. Normal size of the aortic root and ascending aorta. Dilated main pulmonary artery consistent with pulmonary hypertension.  6. There is diffuse mid wall late gadolinium enhancement in the interventricular septum, anterolateral walls and in the  RV myocardium.  7.  No thrombus was seen in the left ventricle.  8. There is severe swirling of the blood in the right ventricle suspicious of the pre-thrombotic state.  IMPRESSION: 1. Severely dilated left and right ventricle with severe biventricular systolic impairment.  Diffuse mid wall late gadolinium enhancement in the myocardium of the left and right ventricle and in the RV attachment points to the LV consistent with dilated cardiomyopathy with biventricular involvement and decompensated heart failure.  There is no  evidence of inflammatory or infiltrative cardiomyopathy.  2. Severely dilated left atrium and moderately dilated right atrium.  3. Severe mitral and moderate tricuspid regurgitation.  4. Dilated main pulmonary artery consistent with pulmonary hypertension. There is severe swirling of the blood in the right ventricle suspicious of the pre-thrombotic state.  Tobias Alexander   Electronically Signed   By: Tobias Alexander   On: 05/05/2015 19:47      Medications:     Scheduled Medications: . clonazePAM  1 mg Oral QHS  . clonazePAM  1 mg Oral Once  . digoxin  0.25 mg Oral Daily  . furosemide  40 mg Oral Daily  . gabapentin  300 mg Oral QHS  . magnesium oxide  200 mg Oral Daily  . metoprolol tartrate  12.5 mg Oral BID  . mometasone-formoterol  2 puff Inhalation BID  . rivaroxaban  15 mg Oral BID WC  . [START ON 05/25/2015] rivaroxaban  20 mg Oral Q supper  . sodium chloride  3 mL Intravenous Q12H  . sodium chloride  3 mL Intravenous Q12H     Infusions:     PRN Medications:  sodium chloride, acetaminophen, albuterol, oxyCODONE-acetaminophen, sodium chloride   Assessment:  1. A/C Biventricular HF 2. Acute DVT RLE 3. Acute PE- RLL noted on CT 4. Severe MR 5. NSVT    Plan/Discussion:   Biventricular HF noted with NHYA IIIb-IV symptoms despite low dose meds. Initiation of HF medications limited by hypotension.  There is concern for low output. Today will need RHC to  further assess. If low output/cardiogenic will need to maintain swan to optimize HF. He understands he may infact need inoptropes. Will hold bb. Continue current dose of digoxin. Adjust diuretics after RHC.   We have discussed advanced therapies today to include  LVAD, transplant, home inotropes, versus palliative measures.  Not a candidate for LVAD with severe RV failure. As above will need RHC to determine if inotropes are needed. May need transplant work up but will need to see over the next several days.    He does have limited social support locally to this may be a factor.     Anticipate involving palliative care for goals of care.    Length of Stay: 7  CLEGG,AMY NP-C  05/07/2015, 10:26 AM  Advanced Heart Failure Team Pager [phone] (M-F; 7a - 4p)  Please contact [facility] for night-coverage after hours (4p -7a ) and weekends on [email or web address]  Patient seen and examined with Tonye Becket, NP. We discussed all aspects of the encounter. I agree with the assessment and plan as stated above.   He has new onset severe NICM with severe biventricular dysfunction on echo. He remains very tenuous NYHA IIIB-IV with low output physiology despite attempts at aggressive med titration in house. Long discussion about advanced HF options with patient and his family. We will move him to ICU and place Ernestine Conrad today to further guide management. Suspect he will need home inotropes. Social support system is poor.  The patient is critically ill with multiple organ systems failure and requires high complexity decision making for assessment and support, frequent evaluation and titration of therapies, application of advanced monitoring technologies and extensive interpretation of multiple databases.   Critical Care Time devoted to patient care services described in this note is 40 Minutes.  Richy Spradley,MD 12:34 PM

## 2015-05-07 NOTE — Progress Notes (Signed)
Name: Jenna Ardoin MRN: [id number] DOB: [birth year]    ADMISSION DATE:  04/30/2015 CONSULTATION DATE:  05/07/2015  REFERRING MD :   TRH Dhungel  CHIEF COMPLAINT:   Nocturnal dyspnea  BRIEF PATIENT DESCRIPTION:   59 year old male has had progressive dyspnea particularly nocturnal and chest tightness over the past several months.  Patient had associated nausea as well with exertional shortness of breath. Patient was found to have abnormal liver function tests and a primary care office and this necessitated transfer over to the emergency room on 05/01/2015. Because of the primary symptom complex of lower extremity edema and nausea CT abdomen and vascular to venous Doppler extremity studies were performed. Abdominal CT scan showed no acute liver process but mesenteric and abdominal wall edema with four-chamber cardiomegaly. There is also right lower extremity deep venous thrombosis in the popliteal vein seen and a right lower lobe pulmonary embolism seen on CT abdomen. The patient was admitted for further evaluation. Cardiac catheterization did not reveal coronary artery disease. No previous sleep study done in the past year showed no sleep apnea. The patient has not been on supplement oxygen since prior to admission.  With treatment of heart failure and diuresis the patient has improved. This still remains on supplemental oxygen.  PAST  NPSG 04/14/98-AHI 0/ hr, Periodic limb movement, mild snoring PSG 03/25/14- Daytime study- AHI 0.9/ hr, mild limb jerks, fragmented sleep with frequent awakening, total sleep time 253.5 minutes, 19.3% REM. MSLT 03/26/14- Done after daytime PSG, mean latency 6.4 min, no SOREM/ 5 naps. Pattern consistent with nonspecific hypersomnia with studies timed to be similar to his usual sleep-wake pattern.  CURRENT SIGNIFICANT EVENTS  And STUDIES:  CT of abdomen showed incidental finding of right lower lobe pulmonary embolism and associated groundglass opacities in right  lower lobe  Vascular Doppler study on 05/01/2015 revealed acute deep venous thrombosis in the right popliteal vein right lower extremity  Cardiac catheterization on 05/02/2015 revealed no coronary artery disease  Echocardiogram on 05/01/2015 revealed ejection fraction 10-15% global hypokinesis there was significant mitral regurgitation and also dilation of the right ventricle and right atrium  05/05/2015 d/w Cards APP and RN - he is improved. Denies acute issues. D/w patient and daughter at bedside - improved but still with paroxysmal noctural dyspnea esp when flat and some orthopnea. Daughter asking for o2 and cpap. Denies active wheeze or cough    SUBJECTIVE/OVERNIGHT/INTERVAL HX 05/07/2015 : Stil having paroxysmal nocturanl dyspnea esp when lying flat. Very worried that it would kill him. Amb pulse ox and spot check at night multiple times when he was sleeping (per chart review) do not show desaturations. He is NOT reassured by normal oxygen or explanation that CHF could be contributing to symptoms. Seems to want o2 for symptom relief. Reports feeling smothered withour O2. Feels sense of doom. Ass. Fatigue +. However, agrees that he is lot better since admission. Not feeling safe to go home though. NSC   VITAL SIGNS: Temp:  [97.7 F (36.5 C)-98.2 F (36.8 C)] 97.9 F (36.6 C) (08/31 0454) Pulse Rate:  [74-84] 74 (08/31 0801) Resp:  [17-20] 18 (08/31 0454) BP: (84-103)/(63-73) 94/65 mmHg (08/31 0801) SpO2:  [95 %-100 %] 97 % (08/31 0801) Weight:  [151 lb 3.2 oz (68.584 kg)-152 lb 14.4 oz (69.355 kg)] 151 lb 3.2 oz (68.584 kg) (08/31 0454)  PHYSICAL EXAMINATION: General: looking ok -appearing male in no acute distress, Not really feeling well Neuro:  Nonfocal. Oriented x 3 Psych: flat and mildly anxious, some  anger noted HEENT:  Jugular venous distention noted Cardiovascular:  Regular rate and rhythm normal heart sounds Lungs:  Decreased breath sounds rales the right base Abdomen:   Soft nontender no mass Musculoskeletal:  Full range of motion Skin:  Clear    PULMONARY  Recent Labs Lab 05/04/15 1200  PHART 7.535*  PCO2ART 24.8*  PO2ART 109*  HCO3 20.9  TCO2 21.6  O2SAT 98.4    CBC  Recent Labs Lab 05/02/15 0512 05/03/15 0217 05/04/15 0332  HGB 14.6 15.2 14.7  HCT 43.5 44.5 43.8  WBC 9.4 8.9 7.2  PLT 154 173 144*    COAGULATION  Recent Labs Lab 04/30/15 2146  INR 1.34    CARDIAC    Recent Labs Lab 05/01/15 0215 05/01/15 0710 05/01/15 1255  TROPONINI <0.03 0.03 0.03   No results for input(s): PROBNP in the last 168 hours.   CHEMISTRY  Recent Labs Lab 05/01/15 0215 05/01/15 1255 05/02/15 0512 05/03/15 0217 05/04/15 0332 05/05/15 0239 05/06/15 0500  NA 135  --  135 131*  --  132* 133*  K 4.4  --  3.9 4.5  --  4.0 4.0  CL 104  --  100* 98*  --  98* 101  CO2 23  --  26 22  --  25 23  GLUCOSE 128*  --  109* 144*  --  114* 106*  BUN 20  --  23* 25*  --  21* 20  CREATININE 1.26*  --  1.26* 1.29*  --  1.29* 1.09  CALCIUM 8.6*  --  8.3* 8.4*  --  8.3* 8.5*  MG  --  1.9 2.0 2.1 1.9 2.0  --    Estimated Creatinine Clearance: 70.8 mL/min (by C-G formula based on Cr of 1.09).   LIVER  Recent Labs Lab 04/30/15 1733 04/30/15 2146 05/01/15 0215 05/02/15 0512 05/03/15 0217 05/05/15 0239  AST 120*  --  90* 50* 42* 27  ALT 272*  --  225* 161* 129* 83*  ALKPHOS 100  --  87 88 90 76  BILITOT 1.8*  --  1.7* 1.7* 1.3* 1.1  PROT 6.5  --  5.7* 5.4* 5.6* 5.5*  ALBUMIN 4.0  --  3.4* 3.3* 3.1* 2.9*  INR  --  1.34  --   --   --   --      INFECTIOUS No results for input(s): LATICACIDVEN, PROCALCITON in the last 168 hours.   ENDOCRINE CBG (last 3)  No results for input(s): GLUCAP in the last 72 hours.       IMAGING x48h  - image(s) NOT personally visualized Dg Sniff Test  05/06/2015   CLINICAL DATA:  Interrupted sleeping with gasping for air and snoring.  EXAM: CHEST FLUOROSCOPY -SNIFF TEST  TECHNIQUE: Real-time  fluoroscopic evaluation of the chest was performed.  FLUOROSCOPY TIME:  0 MINUTES, 6 SECONDS  COMPARISON:  None.  FINDINGS: Small right pleural effusion with blunting of the right costophrenic angle. Mild cardiomegaly.  With sniff maneuver, both diaphragms move down in the expected fashion.  IMPRESSION: 1. Normal diaphragmatic motion with sniff maneuver; no findings of paradoxical movement to suggest diaphragmatic denervation. 2. Small right pleural effusion. 3. Mild cardiomegaly.   Electronically Signed   By: Gaylyn Rong M.D.   On: 05/06/2015 12:21   Mr Card Morphology Wo/w Cm  05/05/2015   CLINICAL DATA:  59 year old male with new diagnosis of non-ischemic cardiomyopathy. Evaluate for possible inflammatory and infiltrative diseases.  EXAM: CARDIAC MRI  TECHNIQUE: The  patient was scanned on a 1.5 Tesla GE magnet. A dedicated cardiac coil was used. Functional imaging was done using Fiesta sequences. 2,3, and 4 chamber views were done to assess for RWMA's. Modified Simpson's rule using a short axis stack was used to calculate an ejection fraction on a dedicated work [employer]. The patient received 23 cc of Multihance. After 10 minutes inversion recovery sequences were used to assess for infiltration and scar tissue.  CONTRAST:  23 cc  of Multihance  FINDINGS: 1. Severely dilated left ventricle with normal wall thickness and severely impaired systolic function (LVEF = 13%).  There is diffuse hypokinesis and paradoxical septal motion.  LVEDD:  76 mm  LVESD:  69 mm  LVEDV:  343 ml  LVESV:  298 ml  SV:  45 ml  CO:  3.5 L/minute  Myocardial mass 196 g  2. Severely dilated right ventricle with normal wall thickness and severely impaired systolic function (RVEF = 12%) with diffuse hypokinesis.  3. Severely dilated left atrium and moderately dilated right atrium.  4.  Severe mitral and moderate tricuspid regurgitation.  5. Normal size of the aortic root and ascending aorta. Dilated main  pulmonary artery consistent with pulmonary hypertension.  6. There is diffuse mid wall late gadolinium enhancement in the interventricular septum, anterolateral walls and in the RV myocardium.  7.  No thrombus was seen in the left ventricle.  8. There is severe swirling of the blood in the right ventricle suspicious of the pre-thrombotic state.  IMPRESSION: 1. Severely dilated left and right ventricle with severe biventricular systolic impairment.  Diffuse mid wall late gadolinium enhancement in the myocardium of the left and right ventricle and in the RV attachment points to the LV consistent with dilated cardiomyopathy with biventricular involvement and decompensated heart failure.  There is no evidence of inflammatory or infiltrative cardiomyopathy.  2. Severely dilated left atrium and moderately dilated right atrium.  3. Severe mitral and moderate tricuspid regurgitation.  4. Dilated main pulmonary artery consistent with pulmonary hypertension. There is severe swirling of the blood in the right ventricle suspicious of the pre-thrombotic state.  Tobias Alexander   Electronically Signed   By: Tobias Alexander   On: 05/05/2015 19:47        ASSESSMENT / PLAN:  #1  acute hypoxemic respiratory failure on the basis of acute heart failure systolic nonischemic. Associated right lower lobe pulmonary embolism and deep venous thrombosis as well.   -  subjective orthopne and pnd still persist  And is OVERWHELMING him subjectively. No evidence of desaturations with walking or spot check at night when sleeping- likely cardiac medaited but GERD could be playing a role - he denies Vs Diaph paralysis Vs active asthma (he reports this as stable and well controlled. Sleep apnea was ruled out bby Dr Maple Hudson last year    REC  - symptoms likely due to Acute systolic CHF - advised to sleep with HOB elevated   -CHECK SNIFF TEST for DIaph issues - Done and negative   - Continue xarelto   - given his comorbid factors   Probably  lifelong anticoagulation as this was an unprovoked venous thrombosis and a malewith associated significant cardiomyopathy and mural thrombus and ventricle   - sleep with HOB elevated   - advised him to buy home pulse oximeter for monitoring o2 levels    - might need continous overnight pulse oximeter check at dc - arranged through DME company   - at this point  does NOT qualify for O2 Rx - I explained this to him several times - he seemed dis-satisfied with this fact  -Consider overnight in house monitoring for desaturation per RT, ordered 8/31  -Follow up with Dr. Maple Hudson , although only seen once for OSA which sleep study was negative for OSA.    #2 moderate persistent asthma with the patient on Advair chronically at home  Recommendations   Dulera to continue  # 3 Anxiety seems to be an issue              Continue Sena Hitch Christena Sunderlin ACNP [facility] PCC[facility] Pager [phone] till 3 pm If no answer page [phone] 05/07/2015, 9:00 AM

## 2015-05-07 NOTE — Progress Notes (Signed)
Nutrition Education Note  RD consulted for nutrition education regarding heart healthy diet for CHF.  RD provided "Low Sodium Nutrition Therapy" handout from the Academy of [employer]. Encouraged fresh fruits and vegetables as well as whole grain sources of carbohydrates to maximize fiber intake.   Patient just received bad news from CHF Team and he's being transferred to [facility] today for RHC and diuresis, EF 10%. Patient and his parents were very appreciative of RD visit, but adjusting to the information he just received and planning for transfer to ICU.   Body mass index is 22.65 kg/(m^2). Pt meets criteria for normal weight based on current BMI.  Current diet order is heart healthy, patient is consuming approximately 100% of meals at this time. Labs and medications reviewed. No further nutrition interventions warranted at this time. RD contact information provided. If additional nutrition issues arise, please re-consult RD.    Joaquin Courts, RD, LDN, CNSC Pager [phone] After Hours Pager [phone]

## 2015-05-07 NOTE — Progress Notes (Signed)
ANTICOAGULATION CONSULT NOTE - Follow Up Consult  Pharmacy Consult for Heparin Indication: DVT/PE  Allergies  Allergen Reactions  . Aspirin     REACTION: facial swelling  . Dust Mite Extract   . Other     Cats   . Peanuts [Peanut Oil]     sob    Patient Measurements: Height: 5' 8.5" (174 cm) Weight: 151 lb 3.2 oz (68.584 kg) IBW/kg (Calculated) : 69.55  Vital Signs: Temp: 97.9 F (36.6 C) (08/31 0454) Temp Source: Oral (08/31 0454) BP: 90/54 mmHg (08/31 0935) Pulse Rate: 74 (08/31 0935)  Labs:  Recent Labs  05/05/15 0239 05/06/15 0500  CREATININE 1.29* 1.09    Estimated Creatinine Clearance: 70.8 mL/min (by C-G formula based on Cr of 1.09).  Assessment: 58yom initially started on IV heparin for his new RLL PE (per CT abd/pelvis 8/24) and RLE DVT (per dopplers 8/25). He was transitioned to xarelto on 8/28. Heart failure team consulted today and he will need a RHC/swan. Plan to hold xarelto (last dose 8/31 at 0940) and switch back to IV heparin for now. Previously therapeutic on 1400 units/hr. Will use aPTTs to monitor heparin as xarelto falsely elevates heparin levels. Will also not start heparin until tonight when next dose of xarelto would have been due.  Goal of Therapy:  APTT 66-102 seconds Heparin level 0.3-0.7 units/ml Monitor platelets by anticoagulation protocol: Yes   Plan:  1) At 2130 tonight, begin heparin at 1400 units/hr with no bolus 2) Check 6 hour heparin level and aPTT 3) Daily heparin level and aPTT  Fredrik Rigger 05/07/2015,11:11 AM

## 2015-05-07 NOTE — Progress Notes (Signed)
Patient states, " If needed, I will accept blood plasma transfusion only. No whole blood, no packed red blood cells, only plasma."  "I am okay with processes that use my own blood like dialysis, cell saver and hemodilution."  " I will accept blood fractions. "  Patient is alert and oriented.

## 2015-05-07 NOTE — Interval H&P Note (Signed)
History and Physical Interval Note:  05/07/2015 3:06 PM  Marcus Bates  has presented today for surgery, with the diagnosis of hf  The various methods of treatment have been discussed with the patient and family. After consideration of risks, benefits and other options for treatment, the patient has consented to  Procedure(s): Right Heart Cath (N/A) as a surgical intervention .  The patient's history has been reviewed, patient examined, no change in status, stable for surgery.  I have reviewed the patient's chart and labs.  Questions were answered to the patient's satisfaction.     Williams Dietrick, Reuel Boom

## 2015-05-07 NOTE — Progress Notes (Signed)
Advanced Heart Failure Rounding Note   Subjective:    Admitted with increased nausea and dyspnea. Cardiology consulted 8/25 newly reduced LV funciton, NSVT, and MR. Had acute PE/DVT. Has been treated with IV lasix and started on BB. Medication regimen limited by hypotension. He has not been on ace or spiro with hypotension.   Today he is complaining of fatigue. + Orthopnea. Mild dyspnea.    8/25 ECHO EF 10% RV dilated  8/25 + Acute DVT distal to mild popliteal vein RLE No DVT on Left  8/26 LHC -  1. Prox RCA to Dist RCA lesion, 20% stenosed. 2. Ost Cx to Dist Cx lesion, 20% stenosed. 3. Mid LAD lesion, 25% stenosed.  FH: Fatherl--> CHF    Objective:   Weight Range:  Vital Signs:   Temp:  [97.7 F (36.5 C)-98.2 F (36.8 C)] 97.9 F (36.6 C) (08/31 0454) Pulse Rate:  [74-84] 74 (08/31 0935) Resp:  [18-20] 18 (08/31 0454) BP: (84-103)/(54-73) 90/54 mmHg (08/31 0935) SpO2:  [95 %-100 %] 96 % (08/31 0935) Weight:  [151 lb 3.2 oz (68.584 kg)-152 lb 14.4 oz (69.355 kg)] 151 lb 3.2 oz (68.584 kg) (08/31 0454) Last BM Date: 05/07/15  Weight change: Filed Weights   05/06/15 0502 05/06/15 1209 05/07/15 0454  Weight: 158 lb 3.2 oz (71.759 kg) 152 lb 14.4 oz (69.355 kg) 151 lb 3.2 oz (68.584 kg)    Intake/Output:   Intake/Output Summary (Last 24 hours) at 05/07/15 1026 Last data filed at 05/07/15 0900  Gross per 24 hour  Intake    360 ml  Output    800 ml  Net   -440 ml     Physical Exam: General:  Fatigued appearing. No resp difficulty. Sitting in the chair.  HEENT: normal Neck: supple. JVP 8-9  . Carotids 2+ bilat; no bruits. No lymphadenopathy or thryomegaly appreciated. Cor: PMI nondisplaced. Regular rate & rhythm. No rubs, or murmurs. + s3  Lungs: clear Abdomen: soft, nontender, + distended. No hepatosplenomegaly. No bruits or masses. Good bowel sounds. Extremities: no cyanosis, clubbing, rash, trace lower extremity edema.  Neuro: alert & orientedx3, cranial nerves  grossly intact. moves all 4 extremities w/o difficulty. Affect pleasant  Telemetry: SR 80s   Labs: Basic Metabolic Panel:  Recent Labs Lab 05/01/15 0215 05/01/15 1255 05/02/15 0512 05/03/15 0217 05/04/15 0332 05/05/15 0239 05/06/15 0500  NA 135  --  135 131*  --  132* 133*  K 4.4  --  3.9 4.5  --  4.0 4.0  CL 104  --  100* 98*  --  98* 101  CO2 23  --  26 22  --  25 23  GLUCOSE 128*  --  109* 144*  --  114* 106*  BUN 20  --  23* 25*  --  21* 20  CREATININE 1.26*  --  1.26* 1.29*  --  1.29* 1.09  CALCIUM 8.6*  --  8.3* 8.4*  --  8.3* 8.5*  MG  --  1.9 2.0 2.1 1.9 2.0  --     Liver Function Tests:  Recent Labs Lab 04/30/15 1733 05/01/15 0215 05/02/15 0512 05/03/15 0217 05/05/15 0239  AST 120* 90* 50* 42* 27  ALT 272* 225* 161* 129* 83*  ALKPHOS 100 87 88 90 76  BILITOT 1.8* 1.7* 1.7* 1.3* 1.1  PROT 6.5 5.7* 5.4* 5.6* 5.5*  ALBUMIN 4.0 3.4* 3.3* 3.1* 2.9*    Recent Labs Lab 04/30/15 1733  LIPASE 23   No results for input(s):  AMMONIA in the last 168 hours.  CBC:  Recent Labs Lab 04/30/15 1733 05/01/15 0215 05/02/15 0512 05/03/15 0217 05/04/15 0332  WBC 8.1 9.3 9.4 8.9 7.2  NEUTROABS  --  7.3  --   --   --   HGB 16.1 14.6 14.6 15.2 14.7  HCT 48.5 45.1 43.5 44.5 43.8  MCV 92.7 92.2 91.0 90.3 92.2  PLT 200 168 154 173 144*    Cardiac Enzymes:  Recent Labs Lab 05/01/15 0215 05/01/15 0710 05/01/15 1255  TROPONINI <0.03 0.03 0.03    BNP: BNP (last 3 results)  Recent Labs  05/01/15 0215  BNP 2662.2*    ProBNP (last 3 results) No results for input(s): PROBNP in the last 8760 hours.    Other results:  Imaging: Dg Sniff Test  05/06/2015   CLINICAL DATA:  Interrupted sleeping with gasping for air and snoring.  EXAM: CHEST FLUOROSCOPY -SNIFF TEST  TECHNIQUE: Real-time fluoroscopic evaluation of the chest was performed.  FLUOROSCOPY TIME:  0 MINUTES, 6 SECONDS  COMPARISON:  None.  FINDINGS: Small right pleural effusion with blunting of  the right costophrenic angle. Mild cardiomegaly.  With sniff maneuver, both diaphragms move down in the expected fashion.  IMPRESSION: 1. Normal diaphragmatic motion with sniff maneuver; no findings of paradoxical movement to suggest diaphragmatic denervation. 2. Small right pleural effusion. 3. Mild cardiomegaly.   Electronically Signed   By: Gaylyn Rong M.D.   On: 05/06/2015 12:21   Mr Card Morphology Wo/w Cm  05/05/2015   CLINICAL DATA:  59 year old male with new diagnosis of non-ischemic cardiomyopathy. Evaluate for possible inflammatory and infiltrative diseases.  EXAM: CARDIAC MRI  TECHNIQUE: The patient was scanned on a 1.5 Tesla GE magnet. A dedicated cardiac coil was used. Functional imaging was done using Fiesta sequences. 2,3, and 4 chamber views were done to assess for RWMA's. Modified Simpson's rule using a short axis stack was used to calculate an ejection fraction on a dedicated work [employer], [employer]. The patient received 23 cc of Multihance. After 10 minutes inversion recovery sequences were used to assess for infiltration and scar tissue.  CONTRAST:  23 cc  of Multihance  FINDINGS: 1. Severely dilated left ventricle with normal wall thickness and severely impaired systolic function (LVEF = 13%).  There is diffuse hypokinesis and paradoxical septal motion.  LVEDD:  76 mm  LVESD:  69 mm  LVEDV:  343 ml  LVESV:  298 ml  SV:  45 ml  CO:  3.5 L/minute  Myocardial mass 196 g  2. Severely dilated right ventricle with normal wall thickness and severely impaired systolic function (RVEF = 12%) with diffuse hypokinesis.  3. Severely dilated left atrium and moderately dilated right atrium.  4.  Severe mitral and moderate tricuspid regurgitation.  5. Normal size of the aortic root and ascending aorta. Dilated main pulmonary artery consistent with pulmonary hypertension.  6. There is diffuse mid wall late gadolinium enhancement in the interventricular septum, anterolateral walls and in the  RV myocardium.  7.  No thrombus was seen in the left ventricle.  8. There is severe swirling of the blood in the right ventricle suspicious of the pre-thrombotic state.  IMPRESSION: 1. Severely dilated left and right ventricle with severe biventricular systolic impairment.  Diffuse mid wall late gadolinium enhancement in the myocardium of the left and right ventricle and in the RV attachment points to the LV consistent with dilated cardiomyopathy with biventricular involvement and decompensated heart failure.  There is no  evidence of inflammatory or infiltrative cardiomyopathy.  2. Severely dilated left atrium and moderately dilated right atrium.  3. Severe mitral and moderate tricuspid regurgitation.  4. Dilated main pulmonary artery consistent with pulmonary hypertension. There is severe swirling of the blood in the right ventricle suspicious of the pre-thrombotic state.  Tobias Alexander   Electronically Signed   By: Tobias Alexander   On: 05/05/2015 19:47      Medications:     Scheduled Medications: . clonazePAM  1 mg Oral QHS  . clonazePAM  1 mg Oral Once  . digoxin  0.25 mg Oral Daily  . furosemide  40 mg Oral Daily  . gabapentin  300 mg Oral QHS  . magnesium oxide  200 mg Oral Daily  . metoprolol tartrate  12.5 mg Oral BID  . mometasone-formoterol  2 puff Inhalation BID  . rivaroxaban  15 mg Oral BID WC  . [START ON 05/25/2015] rivaroxaban  20 mg Oral Q supper  . sodium chloride  3 mL Intravenous Q12H  . sodium chloride  3 mL Intravenous Q12H     Infusions:     PRN Medications:  sodium chloride, acetaminophen, albuterol, oxyCODONE-acetaminophen, sodium chloride   Assessment:  1. A/C Biventricular HF 2. Acute DVT RLE 3. Acute PE- RLL noted on CT 4. Severe MR 5. NSVT    Plan/Discussion:   Biventricular HF noted with NHYA IIIb-IV symptoms despite low dose meds. Initiation of HF medications limited by hypotension.  There is concern for low output. Today will need RHC to  further assess. If low output/cardiogenic will need to maintain swan to optimize HF. He understands he may infact need inoptropes. Will hold bb. Continue current dose of digoxin. Adjust diuretics after RHC.   We have discussed advanced therapies today to include  LVAD, transplant, home inotropes, versus palliative measures.  Not a candidate for LVAD with severe RV failure. As above will need RHC to determine if inotropes are needed. May need transplant work up but will need to see over the next several days.    He does have limited social support locally to this may be a factor.     Anticipate involving palliative care for goals of care.    Length of Stay: 7  CLEGG,AMY NP-C  05/07/2015, 10:26 AM  Advanced Heart Failure Team Pager [phone] (M-F; 7a - 4p)  Please contact [facility] for night-coverage after hours (4p -7a ) and weekends on [email or web address]  Patient seen and examined with Tonye Becket, NP. We discussed all aspects of the encounter. I agree with the assessment and plan as stated above.   He has new onset severe NICM with severe biventricular dysfunction on echo. He remains very tenuous NYHA IIIB-IV with low output physiology despite attempts at aggressive med titration in house. Long discussion about advanced HF options with patient and his family. We will move him to ICU and place Ernestine Conrad today to further guide management. Suspect he will need home inotropes. Social support system is poor.  The patient is critically ill with multiple organ systems failure and requires high complexity decision making for assessment and support, frequent evaluation and titration of therapies, application of advanced monitoring technologies and extensive interpretation of multiple databases.   Critical Care Time devoted to patient care services described in this note is 40 Minutes.  Bensimhon, Daniel,MD 12:34 PM

## 2015-05-07 NOTE — Progress Notes (Signed)
[facility] PROGRESS NOTE  Marcus Bates ZOX:[id number] DOB: 04-Aug-1956 DOA: 04/30/2015 PCP: Lolita Patella, MD  Assessment/Plan: Acute systolic and diastolic CHF with nonischemic cardiomyopathy -Cardiac cath revealed nonobstructive disease. Currently on Lasix 40 mg PO daily. Had been on low dose beta blocker and ramipril. Given low BP, have d/c'd ramipril. Cardiology has since d/c'd metoprolol as of 8/31.  -Pt with severe mitral regurgitation on echo. Cardiology and EP do not recommend TEE and role for valve repair at this time given severe regurgitation of unknown duration. -Cardiac MRI shows severe biventricular dilatation and impaired function and no LV thrombus seen. -Recommend to repeat a 2-D echo in 3 months to evaluate his heart function. - Patient underwent  RHC on 8/31 with findings of low output HF with cardiogenic shock physiology with recs to leave swan in and start milrinone  Increasing dyspnea during the night with? Apnea -Patient reported waking up during feeling suffocated and gasping for air. Sats recorded to be normal. Patient had followed by Dr. Maple Hudson for excessive sleepiness and had sleep study in 04/2014 consistent with nonspecific hypersomnia and ruled out for sleep apnea. -ABG was unremarkable. Possibly related to orthopnea and PND. Keep head of bed elevated at 30 at all times. -no o2 desaturation at rest or ambulation. No nocturnal o2 desat. Sniff test to negative for diaphragmatic muscle denervation . Follow results. -pulmonary recommends continous overnight pulse oximeter check at home.  Severe MR -Possibly secondary to cardiomyopathy.  -No role for TEE and valve replacement at this time as it will not improve his heart function.  Acute DVT with right sided PE -Was started on IV heparin and switched to oral Xarelto on 8/28.  -Plan is for indefinite anticoagulation given his on prolonged venous thromboembolic is in an severe cardiomyopathy and severe  MR.  Severe MR -Possibly Secondary to cardiomyopathy. No role for TEE and valve replacement at this time as it will not improve his heart function.   Nonsustained V. tach -Evaluated by EP. Plan to repeat echo in 3 months for consideration of ICD. No indication for antiarrhythmics or LifeVest at this time. -Has multiple PVCs on the monitor. Asymptomatic. Unable to increase beta blocker dose due to hypotension. Potassium and Magnesium normal.   Hypotension -Possibly associated with severe CHF. Patient is asymptomatic. D/c'd beta blocker and ACEI. Pt remains on digoxin and lasix   Transaminitis -Possibly hepatic ischemia with CHF. Liver and gallbladder on admission CT unremarkable. LFTs improving.   DVT prophylaxis: On Xarelto.  Code Status: Full Family Communication: Pt in room (indicate person spoken with, relationship, and if by phone, the number) Disposition Plan: Pending   Consultants:  Cardiology  Critical Care  Procedures:  Cath 8/26  RHC 8/31  Antibiotics:   (indicate start date, and stop date if known)  HPI/Subjective: States feeling better today  Objective: Filed Vitals:   05/07/15 1542 05/07/15 1546 05/07/15 1551 05/07/15 1556  BP: 102/73 101/73 106/75   Pulse: 83 79 79 0  Temp:      TempSrc:      Resp: 21 9 28  0  Height:      Weight:      SpO2: 99% 97% 98% 0%    Intake/Output Summary (Last 24 hours) at 05/07/15 1737 Last data filed at 05/07/15 0900  Gross per 24 hour  Intake    360 ml  Output    250 ml  Net    110 ml   Filed Weights   05/06/15 0502 05/06/15 1209 05/07/15  0454  Weight: 71.759 kg (158 lb 3.2 oz) 69.355 kg (152 lb 14.4 oz) 68.584 kg (151 lb 3.2 oz)    Exam:   General:  Awake, in nad  Cardiovascular: regular, s1, s2  Respiratory: normal resp effort, no wheezing  Abdomen: soft,nondistended  Musculoskeletal: perfused, no clubbing   Data Reviewed: Basic Metabolic Panel:  Recent Labs Lab 05/01/15 1255  05/02/15 0512 05/03/15 0217 05/04/15 0332 05/05/15 0239 05/06/15 0500 05/07/15 1206  NA  --  135 131*  --  132* 133* 135  K  --  3.9 4.5  --  4.0 4.0 4.5  CL  --  100* 98*  --  98* 101 98*  CO2  --  26 22  --  25 23 30   GLUCOSE  --  109* 144*  --  114* 106* 81  BUN  --  23* 25*  --  21* 20 20  CREATININE  --  1.26* 1.29*  --  1.29* 1.09 1.15  CALCIUM  --  8.3* 8.4*  --  8.3* 8.5* 9.0  MG 1.9 2.0 2.1 1.9 2.0  --   --    Liver Function Tests:  Recent Labs Lab 05/01/15 0215 05/02/15 0512 05/03/15 0217 05/05/15 0239  AST 90* 50* 42* 27  ALT 225* 161* 129* 83*  ALKPHOS 87 88 90 76  BILITOT 1.7* 1.7* 1.3* 1.1  PROT 5.7* 5.4* 5.6* 5.5*  ALBUMIN 3.4* 3.3* 3.1* 2.9*   No results for input(s): LIPASE, AMYLASE in the last 168 hours. No results for input(s): AMMONIA in the last 168 hours. CBC:  Recent Labs Lab 05/01/15 0215 05/02/15 0512 05/03/15 0217 05/04/15 0332 05/07/15 1206  WBC 9.3 9.4 8.9 7.2 7.8  NEUTROABS 7.3  --   --   --   --   HGB 14.6 14.6 15.2 14.7 16.2  HCT 45.1 43.5 44.5 43.8 49.1  MCV 92.2 91.0 90.3 92.2 93.2  PLT 168 154 173 144* 197   Cardiac Enzymes:  Recent Labs Lab 05/01/15 0215 05/01/15 0710 05/01/15 1255  TROPONINI <0.03 0.03 0.03   BNP (last 3 results)  Recent Labs  05/01/15 0215  BNP 2662.2*    ProBNP (last 3 results) No results for input(s): PROBNP in the last 8760 hours.  CBG: No results for input(s): GLUCAP in the last 168 hours.  No results found for this or any previous visit (from the past 240 hour(s)).   Studies: Dg Sniff Test  05/06/2015   CLINICAL DATA:  Interrupted sleeping with gasping for air and snoring.  EXAM: CHEST FLUOROSCOPY -SNIFF TEST  TECHNIQUE: Real-time fluoroscopic evaluation of the chest was performed.  FLUOROSCOPY TIME:  0 MINUTES, 6 SECONDS  COMPARISON:  None.  FINDINGS: Small right pleural effusion with blunting of the right costophrenic angle. Mild cardiomegaly.  With sniff maneuver, both diaphragms  move down in the expected fashion.  IMPRESSION: 1. Normal diaphragmatic motion with sniff maneuver; no findings of paradoxical movement to suggest diaphragmatic denervation. 2. Small right pleural effusion. 3. Mild cardiomegaly.   Electronically Signed   By: Gaylyn Rong M.D.   On: 05/06/2015 12:21    Scheduled Meds: . clonazePAM  1 mg Oral QHS  . clonazePAM  1 mg Oral Once  . digoxin  0.25 mg Oral Daily  . furosemide  40 mg Oral Daily  . gabapentin  300 mg Oral QHS  . magnesium oxide  200 mg Oral Daily  . mometasone-formoterol  2 puff Inhalation BID  . sodium chloride  3  mL Intravenous Q12H  . sodium chloride  3 mL Intravenous Q12H  . sodium chloride  3 mL Intravenous Q12H  . sodium chloride  3 mL Intravenous Q12H   Continuous Infusions: . heparin    . milrinone 0.25 mcg/kg/min (05/07/15 1645)    Principal Problem:   Acute combined systolic and diastolic congestive heart failure Active Problems:   Moderate persistent asthma in adult without complication   Cardiomegaly   Abnormal LFTs   PE (pulmonary embolism)   Severe mitral regurgitation   LV (left ventricular) mural thrombus   NSVT (nonsustained ventricular tachycardia)   Renal insufficiency   AKI (acute kidney injury)   SOB (shortness of breath)   Other specified hypotension   Acute deep vein thrombosis (DVT) of popliteal vein of right lower extremity   Acute respiratory failure with hypoxia   NICM (nonischemic cardiomyopathy)    CHIU, STEPHEN K  [facility] Pager [phone]. If 7PM-7AM, please contact night-coverage at [email or web address], password [facility] 05/07/2015, 5:37 PM  LOS: 7 days

## 2015-05-08 ENCOUNTER — Encounter (HOSPITAL_COMMUNITY): Payer: Self-pay | Admitting: Internal Medicine

## 2015-05-08 DIAGNOSIS — R570 Cardiogenic shock: Secondary | ICD-10-CM

## 2015-05-08 LAB — APTT
aPTT: 97 seconds — ABNORMAL HIGH (ref 24–37)
aPTT: 106 seconds — ABNORMAL HIGH (ref 24–37)

## 2015-05-08 LAB — CBC
HCT: 48.1 % (ref 39.0–52.0)
Hemoglobin: 16.2 g/dL (ref 13.0–17.0)
MCH: 30.9 pg (ref 26.0–34.0)
MCHC: 33.7 g/dL (ref 30.0–36.0)
MCV: 91.8 fL (ref 78.0–100.0)
PLATELETS: 159 10*3/uL (ref 150–400)
RBC: 5.24 MIL/uL (ref 4.22–5.81)
RDW: 15.2 % (ref 11.5–15.5)
WBC: 6.4 10*3/uL (ref 4.0–10.5)

## 2015-05-08 LAB — HEPARIN LEVEL (UNFRACTIONATED): Heparin Unfractionated: >2.2 IU/mL — ABNORMAL HIGH (ref 0.30–0.70)

## 2015-05-08 LAB — CARBOXYHEMOGLOBIN
CARBOXYHEMOGLOBIN: 1.4 % (ref 0.5–1.5)
Carboxyhemoglobin: 1.1 % (ref 0.5–1.5)
METHEMOGLOBIN: 0.7 % (ref 0.0–1.5)
Methemoglobin: 0.7 % (ref 0.0–1.5)
O2 SAT: 74.3 %
O2 Saturation: 72.6 %
TOTAL HEMOGLOBIN: 16 g/dL (ref 13.5–18.0)
Total hemoglobin: 15.5 g/dL (ref 13.5–18.0)

## 2015-05-08 LAB — BASIC METABOLIC PANEL
Anion gap: 9 (ref 5–15)
BUN: 17 mg/dL (ref 6–20)
CHLORIDE: 98 mmol/L — AB (ref 101–111)
CO2: 29 mmol/L (ref 22–32)
Calcium: 8.6 mg/dL — ABNORMAL LOW (ref 8.9–10.3)
Creatinine, Ser: 0.96 mg/dL (ref 0.61–1.24)
GFR calc non Af Amer: >60 mL/min (ref >60)
Glucose, Bld: 82 mg/dL (ref 65–99)
POTASSIUM: 4 mmol/L (ref 3.5–5.1)
SODIUM: 136 mmol/L (ref 135–145)

## 2015-05-08 LAB — MAGNESIUM: MAGNESIUM: 2 mg/dL (ref 1.7–2.4)

## 2015-05-08 MED ORDER — WARFARIN SODIUM 7.5 MG PO TABS: 7.5000 mg | ORAL_TABLET | Freq: Once | ORAL | Status: AC

## 2015-05-08 MED ORDER — SPIRONOLACTONE 25 MG PO TABS
12.5000 mg | ORAL_TABLET | Freq: Every day | ORAL | Status: DC
  Administered 2015-05-08: 12.5 mg via ORAL
  Filled 2015-05-08: qty 1

## 2015-05-08 MED ORDER — LOSARTAN POTASSIUM 25 MG PO TABS
12.5000 mg | ORAL_TABLET | Freq: Every day | ORAL | Status: DC
  Administered 2015-05-08: 12.5 mg via ORAL
  Filled 2015-05-08: qty 1

## 2015-05-08 MED ORDER — COUMADIN BOOK
Freq: Once | Status: DC
  Filled 2015-05-08: qty 1

## 2015-05-08 MED ORDER — NOREPINEPHRINE BITARTRATE 1 MG/ML IV SOLN
0.0000 ug/min | INTRAVENOUS | Status: DC
  Filled 2015-05-08: qty 4

## 2015-05-08 MED ORDER — WARFARIN VIDEO
Freq: Once | Status: DC
Start: 2015-05-08 — End: 2015-05-15

## 2015-05-08 MED ORDER — SODIUM CHLORIDE 0.9 % IV BOLUS (SEPSIS)
250.0000 mL | Freq: Once | INTRAVENOUS | Status: AC
  Administered 2015-05-08: 250 mL via INTRAVENOUS

## 2015-05-08 MED ORDER — WARFARIN - PHARMACIST DOSING INPATIENT: Freq: Every day | Status: DC

## 2015-05-08 NOTE — Progress Notes (Addendum)
Advanced Heart Failure Rounding Note   Subjective:    Admitted with increased nausea and dyspnea. Cardiology consulted 8/25 newly reduced LV funciton, NSVT, and MR. Had acute PE/DVT. Has been treated with IV lasix and started on BB. Medication regimen limited by hypotension. He has not been on ace or spiro with hypotension.    Yesterday he was taken to  Cath lab for RHC/swan placement--> cardiogenic shock. Started on milrinone 0.25 mcg and diuresed with IV lasix. CO-OX 74%.  Brisk diuresis noted over night. Weight down 8 pounds.   Feeling better. Denies SOB/Orthopnea.   Todays swan numbers RA 4  PCWP 14 CO/CI 5.2/2.9   RHC 8/31 RA = 9 RV = 39/5/12 PA = 41/20 (28) PCW = 18 Fick cardiac output/index = 2.6/1.4 Therms cardiac output/index = 3.6/2.0 PVR = 3.8 WU FA sat = 98% PA sat = 54%, 57%   8/25 ECHO EF 10% RV dilated  8/25 + Acute DVT distal to mild popliteal vein RLE No DVT on Left  8/26 LHC -  1. Prox RCA to Dist RCA lesion, 20% stenosed. 2. Ost Cx to Dist Cx lesion, 20% stenosed. 3. Mid LAD lesion, 25% stenosed.   FH: Father --> CHF due to ischemic CM  Objective:   Weight Range:  Vital Signs:   Temp:  [97.2 F (36.2 C)-98.1 F (36.7 C)] 97.7 F (36.5 C) (09/01 0600) Pulse Rate:  [0-129] 81 (09/01 0600) Resp:  [0-28] 12 (09/01 0600) BP: (73-126)/(45-85) 87/59 mmHg (09/01 0600) SpO2:  [0 %-100 %] 97 % (09/01 0600) Weight:  [143 lb 4.8 oz (65 kg)] 143 lb 4.8 oz (65 kg) (09/01 0500) Last BM Date: 05/07/15  Weight change: Filed Weights   05/06/15 1209 05/07/15 0454 05/08/15 0500  Weight: 152 lb 14.4 oz (69.355 kg) 151 lb 3.2 oz (68.584 kg) 143 lb 4.8 oz (65 kg)    Intake/Output:   Intake/Output Summary (Last 24 hours) at 05/08/15 0758 Last data filed at 05/08/15 0700  Gross per 24 hour  Intake 1201.02 ml  Output   5225 ml  Net -4023.98 ml     Physical Exam: CVP 6  General:  Fatigued appearing. No resp difficulty. Sitting in bed  HEENT: normal   Neck: supple. RIJ swan  Carotids 2+ bilat; no bruits. No lymphadenopathy or thryomegaly appreciated. Cor: PMI nondisplaced. Regular rate & rhythm. No rubs, or murmurs. + s3  Lungs: clear Abdomen: soft, nontender, non- distended. No hepatosplenomegaly. No bruits or masses. Good bowel sounds. Extremities: no cyanosis, clubbing, rash, trace lower extremity edema.  Neuro: alert & orientedx3, cranial nerves grossly intact. moves all 4 extremities w/o difficulty. Affect pleasant  Telemetry: SR 80s  NSVT   Labs: Basic Metabolic Panel:  Recent Labs Lab 05/01/15 1255  05/02/15 0512 05/03/15 0217 05/04/15 0332 05/05/15 0239 05/06/15 0500 05/07/15 1206 05/08/15 0505  NA  --   < > 135 131*  --  132* 133* 135 136  K  --   < > 3.9 4.5  --  4.0 4.0 4.5 4.0  CL  --   < > 100* 98*  --  98* 101 98* 98*  CO2  --   < > 26 22  --  25 23 30 29   GLUCOSE  --   < > 109* 144*  --  114* 106* 81 82  BUN  --   < > 23* 25*  --  21* 20 20 17   CREATININE  --   < > 1.26* 1.29*  --  1.29* 1.09 1.15 0.96  CALCIUM  --   < > 8.3* 8.4*  --  8.3* 8.5* 9.0 8.6*  MG 1.9  --  2.0 2.1 1.9 2.0  --   --   --   < > = values in this interval not displayed.  Liver Function Tests:  Recent Labs Lab 05/02/15 0512 05/03/15 0217 05/05/15 0239  AST 50* 42* 27  ALT 161* 129* 83*  ALKPHOS 88 90 76  BILITOT 1.7* 1.3* 1.1  PROT 5.4* 5.6* 5.5*  ALBUMIN 3.3* 3.1* 2.9*   No results for input(s): LIPASE, AMYLASE in the last 168 hours. No results for input(s): AMMONIA in the last 168 hours.  CBC:  Recent Labs Lab 05/02/15 0512 05/03/15 0217 05/04/15 0332 05/07/15 1206 05/08/15 0505  WBC 9.4 8.9 7.2 7.8 6.4  HGB 14.6 15.2 14.7 16.2 16.2  HCT 43.5 44.5 43.8 49.1 48.1  MCV 91.0 90.3 92.2 93.2 91.8  PLT 154 173 144* 197 159    Cardiac Enzymes:  Recent Labs Lab 05/01/15 1255  TROPONINI 0.03    BNP: BNP (last 3 results)  Recent Labs  05/01/15 0215  BNP 2662.2*    ProBNP (last 3 results) No results  for input(s): PROBNP in the last 8760 hours.    Other results:  Imaging: Dg Sniff Test  05/06/2015   CLINICAL DATA:  Interrupted sleeping with gasping for air and snoring.  EXAM: CHEST FLUOROSCOPY -SNIFF TEST  TECHNIQUE: Real-time fluoroscopic evaluation of the chest was performed.  FLUOROSCOPY TIME:  0 MINUTES, 6 SECONDS  COMPARISON:  None.  FINDINGS: Small right pleural effusion with blunting of the right costophrenic angle. Mild cardiomegaly.  With sniff maneuver, both diaphragms move down in the expected fashion.  IMPRESSION: 1. Normal diaphragmatic motion with sniff maneuver; no findings of paradoxical movement to suggest diaphragmatic denervation. 2. Small right pleural effusion. 3. Mild cardiomegaly.   Electronically Signed   By: Gaylyn Rong M.D.   On: 05/06/2015 12:21     Medications:     Scheduled Medications: . clonazePAM  1 mg Oral QHS  . clonazePAM  1 mg Oral Once  . digoxin  0.25 mg Oral Daily  . furosemide  80 mg Intravenous BID  . gabapentin  300 mg Oral QHS  . magnesium oxide  200 mg Oral Daily  . mometasone-formoterol  2 puff Inhalation BID  . sodium chloride  3 mL Intravenous Q12H  . sodium chloride  3 mL Intravenous Q12H  . sodium chloride  3 mL Intravenous Q12H  . sodium chloride  3 mL Intravenous Q12H    Infusions: . heparin 1,400 Units/hr (05/07/15 2204)  . milrinone 0.25 mcg/kg/min (05/07/15 2000)    PRN Medications: sodium chloride, sodium chloride, sodium chloride, acetaminophen, albuterol, ondansetron (ZOFRAN) IV, oxyCODONE-acetaminophen, sodium chloride, sodium chloride, sodium chloride   Assessment:  1. A/C Biventricular HF 2. Acute DVT RLE 3. Acute PE- RLL noted on CT 4. Severe MR 5. NSVT 6. Cardiogenic Shock     Plan/Discussion:   RHC with evidence of cardiogenic shock. Hemodynamics improved today on milrinone 0.25 mcg.  CO-OX today 74% . Brisk diuresis over night. Volume status stable. Stop lasix CVP 5-6. No bb/ace with low output  and hypotension. Renal function stable today. Can try to wean milrinone tomorrow.   On heparin for acute/DVT/PE. Start coumadin. Will need anticoagulants at least 6 months. In the event he needs LVAD will use coumadin instead of xarelto.    NSVT- Check Mag -->2.0 K 4.0 Likely  related to milrinone.   He does have limited social support locally to this may be a factor.     Anticipate involving palliative care for goals of care.    Length of Stay: 8  CLEGG,AMY NP-C  05/08/2015, 7:58 AM  Advanced Heart Failure Team Pager [phone] (M-F; 7a - 4p)  Please contact [facility] for night-coverage after hours (4p -7a ) and weekends on [email or web address]  Patient seen and examined with Tonye Becket, NP. We discussed all aspects of the encounter. I agree with the assessment and plan as stated above.   Ernestine Conrad numbers reviewed personally. Improved with milrinone. Will try to transition to oral meds and see how he does. He is not good candidate for advanced therapies due to social situation. May need to consider home inotropes. We will see how he does.  Agree with heparin -> coumadin.   The patient is critically ill with multiple organ systems failure and requires high complexity decision making for assessment and support, frequent evaluation and titration of therapies, application of advanced monitoring technologies and extensive interpretation of multiple databases.   Critical Care Time devoted to patient care services described in this note is 35 Minutes.   Bensimhon, Daniel,MD 2:32 PM

## 2015-05-08 NOTE — Progress Notes (Signed)
[facility] PROGRESS NOTE  Marcus Bates WUJ:[id number] DOB: [birth year] DOA: 04/30/2015 PCP: Lolita Patella, MD  Assessment/Plan: Acute systolic and diastolic CHF with nonischemic cardiomyopathy -Cardiac cath revealed nonobstructive disease. Currently on Lasix 40 mg PO daily. Had been on low dose beta blocker and ramipril. Given low BP, have d/c'd ramipril. Cardiology has since d/c'd metoprolol as of 8/31.  -Pt with severe mitral regurgitation on echo. Cardiology and EP do not recommend TEE and role for valve repair at this time given severe regurgitation of unknown duration. -Cardiac MRI shows severe biventricular dilatation and impaired function and no LV thrombus seen. -Recommend to repeat a 2-D echo in 3 months to evaluate his heart function. - Patient underwent  RHC on 8/31 with findings of low output HF with cardiogenic shock physiology - Patient has been continued on milrinone with plans to titrate off as tolerated - Continued lasix, but d/c'd beta blocker and acei given hypotension  Increasing dyspnea during the night with? Apnea -Patient recently reported waking up during feeling suffocated and gasping for air. Sats recorded to be normal. Patient had followed by Dr. Maple Hudson for excessive sleepiness and had sleep study in 04/2014 consistent with nonspecific hypersomnia and ruled out for sleep apnea. -ABG was unremarkable. Possibly related to orthopnea and PND. Keep head of bed elevated at 30 at all times. -no o2 desaturation at rest or ambulation. No nocturnal o2 desat. Sniff test to negative for diaphragmatic muscle denervation . Follow results. -Today, reports breathing better at night  Severe MR -Possibly secondary to cardiomyopathy.  -No role for TEE and valve replacement at this time as it will not improve his heart function.  Acute DVT with right sided PE -Was started on IV heparin and switched to oral Xarelto on 8/28, changed to coumadin on 9/1 given social  issues -Plan is for at least 6 mos, if not longer, anticoagulation given his on prolonged venous thromboembolic is in an severe cardiomyopathy and severe MR.  Severe MR -Possibly Secondary to cardiomyopathy. No role for TEE and valve replacement at this time as it will not improve his heart function.   Nonsustained V. tach -Evaluated by EP. Plan to repeat echo in 3 months for consideration of ICD. No indication for antiarrhythmics or LifeVest at this time. -Has multiple PVCs on the monitor. Asymptomatic. Unable to increase beta blocker dose due to hypotension. Potassium and Magnesium normal.   Hypotension -Possibly associated with severe CHF. Patient is asymptomatic. D/c'd beta blocker and ACEI. Pt remains on digoxin and lasix -Stable   Transaminitis -Possibly hepatic ischemia with CHF. Liver and gallbladder on admission CT unremarkable. LFTs improving.   DVT prophylaxis: Had been on Xarelto, transition to coumadin  Code Status: Full Family Communication: Pt in room Disposition Plan: Pending   Consultants:  Cardiology  Critical Care  Procedures:  Cath 8/26  RHC 8/31  Antibiotics:   (indicate start date, and stop date if known)  HPI/Subjective: Reports breathing better while laying in bed  Objective: Filed Vitals:   05/08/15 1200 05/08/15 1300 05/08/15 1350 05/08/15 1400  BP: 87/64 104/84    Pulse: 88 93 83   Temp: 97.5 F (36.4 C) 97.9 F (36.6 C) 98.1 F (36.7 C)   TempSrc: Core (Comment)   Core (Comment)  Resp: 23 14 15    Height:      Weight:      SpO2: 97% 100% 98%     Intake/Output Summary (Last 24 hours) at 05/08/15 1428 Last data filed at 05/08/15 1400  Gross per 24 hour  Intake 1634.72 ml  Output   5650 ml  Net -4015.28 ml   Filed Weights   05/06/15 1209 05/07/15 0454 05/08/15 0500  Weight: 69.355 kg (152 lb 14.4 oz) 68.584 kg (151 lb 3.2 oz) 65 kg (143 lb 4.8 oz)    Exam:   General:  Asleep, easily arousable, in  nad  Cardiovascular: regular, s1, s2  Respiratory: normal resp effort, no wheezing  Abdomen: soft,nondistended  Musculoskeletal: perfused, no clubbing, no cyanosis  Data Reviewed: Basic Metabolic Panel:  Recent Labs Lab 05/02/15 0512 05/03/15 0217 05/04/15 0332 05/05/15 0239 05/06/15 0500 05/07/15 1206 05/08/15 0505 05/08/15 0815  NA 135 131*  --  132* 133* 135 136  --   K 3.9 4.5  --  4.0 4.0 4.5 4.0  --   CL 100* 98*  --  98* 101 98* 98*  --   CO2 26 22  --  25 23 30 29   --   GLUCOSE 109* 144*  --  114* 106* 81 82  --   BUN 23* 25*  --  21* 20 20 17   --   CREATININE 1.26* 1.29*  --  1.29* 1.09 1.15 0.96  --   CALCIUM 8.3* 8.4*  --  8.3* 8.5* 9.0 8.6*  --   MG 2.0 2.1 1.9 2.0  --   --   --  2.0   Liver Function Tests:  Recent Labs Lab 05/02/15 0512 05/03/15 0217 05/05/15 0239  AST 50* 42* 27  ALT 161* 129* 83*  ALKPHOS 88 90 76  BILITOT 1.7* 1.3* 1.1  PROT 5.4* 5.6* 5.5*  ALBUMIN 3.3* 3.1* 2.9*   No results for input(s): LIPASE, AMYLASE in the last 168 hours. No results for input(s): AMMONIA in the last 168 hours. CBC:  Recent Labs Lab 05/02/15 0512 05/03/15 0217 05/04/15 0332 05/07/15 1206 05/08/15 0505  WBC 9.4 8.9 7.2 7.8 6.4  HGB 14.6 15.2 14.7 16.2 16.2  HCT 43.5 44.5 43.8 49.1 48.1  MCV 91.0 90.3 92.2 93.2 91.8  PLT 154 173 144* 197 159   Cardiac Enzymes: No results for input(s): CKTOTAL, CKMB, CKMBINDEX, TROPONINI in the last 168 hours. BNP (last 3 results)  Recent Labs  05/01/15 0215  BNP 2662.2*    ProBNP (last 3 results) No results for input(s): PROBNP in the last 8760 hours.  CBG: No results for input(s): GLUCAP in the last 168 hours.  No results found for this or any previous visit (from the past 240 hour(s)).   Studies: No results found.  Scheduled Meds: . clonazePAM  1 mg Oral QHS  . clonazePAM  1 mg Oral Once  . digoxin  0.25 mg Oral Daily  . gabapentin  300 mg Oral QHS  . losartan  12.5 mg Oral Daily  .  magnesium oxide  200 mg Oral Daily  . mometasone-formoterol  2 puff Inhalation BID  . sodium chloride  3 mL Intravenous Q12H  . sodium chloride  3 mL Intravenous Q12H  . sodium chloride  3 mL Intravenous Q12H  . sodium chloride  3 mL Intravenous Q12H  . spironolactone  12.5 mg Oral Daily   Continuous Infusions: . heparin 1,400 Units/hr (05/07/15 2204)  . milrinone 0.25 mcg/kg/min (05/08/15 0805)    Principal Problem:   Acute combined systolic and diastolic congestive heart failure Active Problems:   Moderate persistent asthma in adult without complication   Cardiomegaly   Abnormal LFTs   PE (pulmonary embolism)   Severe  mitral regurgitation   LV (left ventricular) mural thrombus   NSVT (nonsustained ventricular tachycardia)   Renal insufficiency   AKI (acute kidney injury)   SOB (shortness of breath)   Other specified hypotension   Acute deep vein thrombosis (DVT) of popliteal vein of right lower extremity   Acute respiratory failure with hypoxia   NICM (nonischemic cardiomyopathy)    Marcus Bates K  [facility] Pager [phone]. If 7PM-7AM, please contact night-coverage at [email or web address], password [facility] 05/08/2015, 2:28 PM  LOS: 8 days

## 2015-05-08 NOTE — Progress Notes (Addendum)
ANTICOAGULATION CONSULT NOTE - Follow Up Consult  Pharmacy Consult for Heparin and Coumadin Indication: DVT/PE  Allergies  Allergen Reactions  . Aspirin     REACTION: facial swelling  . Dust Mite Extract   . Other     Cats   . Peanuts [Peanut Oil]     sob    Patient Measurements: Height: 5' 8.5" (174 cm) Weight: 143 lb 4.8 oz (65 kg) IBW/kg (Calculated) : 69.55  Vital Signs: Temp: 97.7 F (36.5 C) (09/01 0915) Temp Source: Oral (09/01 0800) BP: 94/65 mmHg (09/01 0900) Pulse Rate: 88 (09/01 0915)  Labs:  Recent Labs  05/06/15 0500 05/07/15 1206 05/08/15 0505 05/08/15 0815  HGB  --  16.2 16.2  --   HCT  --  49.1 48.1  --   PLT  --  197 159  --   APTT  --   --   --  97*  LABPROT  --  28.3*  --   --   INR  --  2.70*  --   --   HEPARINUNFRC  --   --   --  >2.20*  CREATININE 1.09 1.15 0.96  --     Estimated Creatinine Clearance: 76.2 mL/min (by C-G formula based on Cr of 0.96).  Assessment: 58yom initially started on IV heparin for his new RLL PE (per CT abd/pelvis 8/24) and RLE DVT (per dopplers 8/25). He was transitioned to xarelto on 8/28. Heart failure team consulted for possible advanced therapies. Xarelto was held (last dose 8/31 at 0940) and he was started on IV heparin yesterday. Using aPTTs to monitor heparin as xarelto falsely elevates heparin levels. Initial heparin level is >2.2 indicating xarelto is still on board, however, aPTT is therapeutic at 97.   Xarelto will also be switched to coumadin given the likelihood of advanced therapies in the near future. He will begin coumadin tonight. Baseline INR is 2.7 but falsely elevated due to xarelto - LFTs ok. Coumadin score = 6. Will need at least a 5 day overlap with heparin and coumadin for acute VTE.  Goal of Therapy:  INR 2-3 APTT 66-102 seconds Heparin level 0.3-0.7 units/ml Monitor platelets by anticoagulation protocol: Yes   Plan:  1) Continue heparin at 1400 units/hr 2) Check 6 hour aPTT to  confirm 3) Coumadin 7.5mg  x 1 4) Coumadin education 5) Daily heparin level, aPTT, INR  Fredrik Rigger 05/08/2015,10:14 AM   ADDENDUM:  Confirmatory aPTT was slightly elevated at 106.  Plan: Decrease heparin gtt to 1300 units/hr Check 6 hour aPTT Monitor daily aPTT / HL, CBC, s/s of bleed

## 2015-05-08 NOTE — Progress Notes (Signed)
   Called by staff nurse for hypotension. SBP 60-70s. Earlier today sprio and losartan started. CVP 2.    Stop spiro and losartan. Give 250 cc NS now. Start norepi to maintain SBP > 85.    Check CO-OX now. Continue to monitor closely.    CLEGG,AMY NP-C  3:58 PM   Patient seen and examined with Tonye Becket, NP on night rounds. We discussed all aspects of the encounter. I agree with the assessment and plan as stated above.   Remains very tenuous. Given IVF. Support BP with levophed. Repeat co-ox ok at 72% on milrinone.   Dalvin Clipper,MD 10:40 PM

## 2015-05-08 NOTE — Care Management Note (Signed)
Case Management Note  Patient Details  Name: Marcus Bates MRN: [id number] Date of Birth: 03-19-1956  Subjective/Objective:    Adm w heart failure, transf to ccu, started milrinone drip                Action/Plan:lives at home  Expected Discharge Date:                 Expected Discharge Plan:    In-House Referral:     Discharge planning Services  CM Consult  Post Acute Care Choice:  NA Choice offered to:  NA  DME Arranged:    DME Agency:     HH Arranged:  NA HH Agency:     Status of Service:  In process, will continue to follow  Medicare Important Message Given:    Date Medicare IM Given:    Medicare IM give by:    Date Additional Medicare IM Given:    Additional Medicare Important Message give by:     If discussed at Long Length of Stay Meetings, dates discussed:    Additional Comments:may need hhc if goes home on iv milrinone  [name], RN 05/08/2015, 9:03 AM

## 2015-05-09 LAB — BASIC METABOLIC PANEL
Anion gap: 6 (ref 5–15)
BUN: 15 mg/dL (ref 6–20)
CALCIUM: 8.3 mg/dL — AB (ref 8.9–10.3)
CHLORIDE: 101 mmol/L (ref 101–111)
CO2: 26 mmol/L (ref 22–32)
CREATININE: 0.94 mg/dL (ref 0.61–1.24)
Glucose, Bld: 88 mg/dL (ref 65–99)
Potassium: 4.2 mmol/L (ref 3.5–5.1)
SODIUM: 133 mmol/L — AB (ref 135–145)

## 2015-05-09 LAB — HEPARIN LEVEL (UNFRACTIONATED)
HEPARIN UNFRACTIONATED: 0.88 [IU]/mL — AB (ref 0.30–0.70)
Heparin Unfractionated: 0.7 IU/mL (ref 0.30–0.70)

## 2015-05-09 LAB — CARBOXYHEMOGLOBIN
Carboxyhemoglobin: 1.5 % (ref 0.5–1.5)
Methemoglobin: 0.7 % (ref 0.0–1.5)
O2 SAT: 76 %
TOTAL HEMOGLOBIN: 15.8 g/dL (ref 13.5–18.0)

## 2015-05-09 LAB — MRSA PCR SCREENING: MRSA BY PCR: NEGATIVE

## 2015-05-09 LAB — PROTIME-INR
INR: 1.37 (ref 0.00–1.49)
PROTHROMBIN TIME: 16.9 s — AB (ref 11.6–15.2)

## 2015-05-09 LAB — APTT
aPTT: 63 seconds — ABNORMAL HIGH (ref 24–37)
aPTT: 87 seconds — ABNORMAL HIGH (ref 24–37)
aPTT: 126 seconds — ABNORMAL HIGH (ref 24–37)

## 2015-05-09 MED ORDER — MILRINONE IN DEXTROSE 20 MG/100ML IV SOLN
0.1250 ug/kg/min | INTRAVENOUS | Status: DC
  Filled 2015-05-09: qty 100

## 2015-05-09 NOTE — Progress Notes (Signed)
ANTICOAGULATION CONSULT NOTE - Follow Up Consult  Pharmacy Consult for Heparin  Indication: pulmonary embolus and DVT  Allergies  Allergen Reactions  . Aspirin     REACTION: facial swelling  . Dust Mite Extract   . Other     Cats   . Peanuts [Peanut Oil]     sob   Patient Measurements: Height: 5' 8.5" (174 cm) Weight: 143 lb 4.8 oz (65 kg) IBW/kg (Calculated) : 69.55  Vital Signs: Temp: 97.9 F (36.6 C) (09/02 0130) Temp Source: Core (Comment) (09/01 2000) BP: 93/52 mmHg (09/02 0100) Pulse Rate: 85 (09/02 0130)  Labs:  Recent Labs  05/06/15 0500 05/07/15 1206 05/08/15 0505 05/08/15 0815 05/08/15 1536 05/08/15 2225  HGB  --  16.2 16.2  --   --   --   HCT  --  49.1 48.1  --   --   --   PLT  --  197 159  --   --   --   APTT  --   --   --  97* 106* 63*  LABPROT  --  28.3*  --   --   --   --   INR  --  2.70*  --   --   --   --   HEPARINUNFRC  --   --   --  >2.20*  --   --   CREATININE 1.09 1.15 0.96  --   --   --     Estimated Creatinine Clearance: 76.2 mL/min (by C-G formula based on Cr of 0.96).   Assessment: Sub-therapeutic aPTT after rate decrease, no issues per RN, using aPTT to dose given Xarelto influence on anti-Xa levels.   Goal of Therapy:  Heparin level 0.3-0.7 units/ml Monitor platelets by anticoagulation protocol: Yes   Plan:  -Increase heparin back to 1400 units/hr -Check 1000 aPTT/HL -Daily CBC/aPTT/HL -Monitor for bleeding  Abran Duke 05/09/2015,2:55 AM

## 2015-05-09 NOTE — Progress Notes (Signed)
[facility] PROGRESS NOTE  Marcus Bates ZOX:[id number] DOB: 15-Sep-1955 DOA: 04/30/2015 PCP: Lolita Patella, MD  Assessment/Plan: Acute systolic and diastolic CHF with nonischemic cardiomyopathy -Cardiac cath revealed nonobstructive disease. Currently on Lasix 40 mg PO daily. Had been on low dose beta blocker and ramipril. Given low BP, have d/c'd ramipril. Cardiology has since d/c'd metoprolol as of 8/31.  -Pt with severe mitral regurgitation on echo. Cardiology and EP do not recommend TEE and role for valve repair at this time given severe regurgitation of unknown duration. -Cardiac MRI shows severe biventricular dilatation and impaired function and no LV thrombus seen. -Recommend to repeat a 2-D echo in 3 months to evaluate his heart function. - Patient underwent  RHC on 8/31 with findings of low output HF with cardiogenic shock physiology - Patient has been continued on milrinone with plans to titrate off as tolerated - Now off lasix, recently d/c'd beta blocker and acei given hypotension  Increasing dyspnea during the night with? Apnea -Patient recently reported waking up during feeling suffocated and gasping for air. Sats noted to be normal. Patient had been followed by Dr. Maple Hudson for excessive sleepiness and had sleep study in 8/2015consistent with nonspecific hypersomnia and ruled out for sleep apnea. -ABG was unremarkable. Possibly related to orthopnea and PND. Keep head of bed elevated at 30 at all times. -No o2 desaturation at rest or ambulation. No nocturnal o2 desat. Sniff test negative for diaphragmatic muscle denervation  -Patient reports breathing better  Severe MR -Possibly secondary to cardiomyopathy.  -No role for TEE and valve replacement at this time as it will not improve his heart function.  Acute DVT with right sided PE -Was started on IV heparin and switched to oral Xarelto on 8/28, changed to coumadin on 9/1 given social issues -Plan is for at least  6 mos, if not longer, anticoagulation given his on prolonged venous thromboembolic is in an severe cardiomyopathy and severe MR.  Severe MR -Possibly Secondary to cardiomyopathy. No role for TEE and valve replacement at this time as it will not improve his heart function.   Nonsustained V. tach -Evaluated by EP. Plan to repeat echo in 3 months for consideration of ICD. No indication for antiarrhythmics or LifeVest at this time. -Has multiple PVCs on the monitor. Asymptomatic. Unable to increase beta blocker dose due to hypotension. Potassium and Magnesium normal.   Hypotension -Possibly associated with severe CHF. Patient is asymptomatic. D/c'd beta blocker and ACEI. Pt remains on digoxin and lasix -Stable   Transaminitis -Possibly hepatic ischemia with CHF. Liver and gallbladder on admission CT unremarkable. LFTs improving.   DVT prophylaxis: Had been on Xarelto, transition to coumadin  Code Status: Full Family Communication: Pt in room Disposition Plan: Pending   Consultants:  Cardiology  Critical Care  Procedures:  Cath 8/26  RHC 8/31  Antibiotics:   (indicate start date, and stop date if known)  HPI/Subjective: No complaints this AM. Reports continuing to void well, albeit slowing somewhat  Objective: Filed Vitals:   05/09/15 0900 05/09/15 1000 05/09/15 1100 05/09/15 1200  BP: 111/68 100/59 95/54 93/59   Pulse: 102 86 83 85  Temp: 98.1 F (36.7 C)   97.7 F (36.5 C)  TempSrc:      Resp: 16 14 13 14   Height:      Weight:      SpO2: 100% 99% 96% 96%    Intake/Output Summary (Last 24 hours) at 05/09/15 1541 Last data filed at 05/09/15 1200  Gross per 24 hour  Intake 1051.92 ml  Output   1200 ml  Net -148.08 ml   Filed Weights   05/07/15 0454 05/08/15 0500 05/09/15 0500  Weight: 68.584 kg (151 lb 3.2 oz) 65 kg (143 lb 4.8 oz) 66.4 kg (146 lb 6.2 oz)    Exam:   General:  Awake, laying in bed,  in nad  Cardiovascular: regular, s1,  s2  Respiratory: normal resp effort, no wheezing  Abdomen: soft,nondistended, nontender  Musculoskeletal: perfused, no clubbing, no cyanosis  Data Reviewed: Basic Metabolic Panel:  Recent Labs Lab 05/03/15 0217 05/04/15 0332 05/05/15 0239 05/06/15 0500 05/07/15 1206 05/08/15 0505 05/08/15 0815 05/09/15 0510  NA 131*  --  132* 133* 135 136  --  133*  K 4.5  --  4.0 4.0 4.5 4.0  --  4.2  CL 98*  --  98* 101 98* 98*  --  101  CO2 22  --  25 23 30 29   --  26  GLUCOSE 144*  --  114* 106* 81 82  --  88  BUN 25*  --  21* 20 20 17   --  15  CREATININE 1.29*  --  1.29* 1.09 1.15 0.96  --  0.94  CALCIUM 8.4*  --  8.3* 8.5* 9.0 8.6*  --  8.3*  MG 2.1 1.9 2.0  --   --   --  2.0  --    Liver Function Tests:  Recent Labs Lab 05/03/15 0217 05/05/15 0239  AST 42* 27  ALT 129* 83*  ALKPHOS 90 76  BILITOT 1.3* 1.1  PROT 5.6* 5.5*  ALBUMIN 3.1* 2.9*   No results for input(s): LIPASE, AMYLASE in the last 168 hours. No results for input(s): AMMONIA in the last 168 hours. CBC:  Recent Labs Lab 05/03/15 0217 05/04/15 0332 05/07/15 1206 05/08/15 0505  WBC 8.9 7.2 7.8 6.4  HGB 15.2 14.7 16.2 16.2  HCT 44.5 43.8 49.1 48.1  MCV 90.3 92.2 93.2 91.8  PLT 173 144* 197 159   Cardiac Enzymes: No results for input(s): CKTOTAL, CKMB, CKMBINDEX, TROPONINI in the last 168 hours. BNP (last 3 results)  Recent Labs  05/01/15 0215  BNP 2662.2*    ProBNP (last 3 results) No results for input(s): PROBNP in the last 8760 hours.  CBG: No results for input(s): GLUCAP in the last 168 hours.  Recent Results (from the past 240 hour(s))  MRSA PCR Screening     Status: None   Collection Time: 05/09/15  9:20 AM  Result Value Ref Range Status   MRSA by PCR NEGATIVE NEGATIVE Final    Comment:        The GeneXpert MRSA Assay (FDA approved for NASAL specimens only), is one component of a comprehensive MRSA colonization surveillance program. It is not intended to diagnose  MRSA infection nor to guide or monitor treatment for MRSA infections.      Studies: No results found.  Scheduled Meds: . clonazePAM  1 mg Oral QHS  . clonazePAM  1 mg Oral Once  . coumadin book   Does not apply Once  . digoxin  0.25 mg Oral Daily  . gabapentin  300 mg Oral QHS  . magnesium oxide  200 mg Oral Daily  . mometasone-formoterol  2 puff Inhalation BID  . sodium chloride  3 mL Intravenous Q12H  . sodium chloride  3 mL Intravenous Q12H  . sodium chloride  3 mL Intravenous Q12H  . sodium chloride  3 mL Intravenous Q12H  . warfarin  7.5 mg Oral ONCE-1800  . warfarin   Does not apply Once  . Warfarin - Pharmacist Dosing Inpatient   Does not apply q1800   Continuous Infusions: . heparin 1,400 Units/hr (05/09/15 1212)  . milrinone 0.125 mcg/kg/min (05/09/15 1200)  . norepinephrine (LEVOPHED) Adult infusion Stopped (05/08/15 1630)    Principal Problem:   Acute combined systolic and diastolic congestive heart failure Active Problems:   Moderate persistent asthma in adult without complication   Cardiomegaly   Abnormal LFTs   PE (pulmonary embolism)   Severe mitral regurgitation   LV (left ventricular) mural thrombus   NSVT (nonsustained ventricular tachycardia)   Renal insufficiency   AKI (acute kidney injury)   SOB (shortness of breath)   Other specified hypotension   Acute deep vein thrombosis (DVT) of popliteal vein of right lower extremity   Acute respiratory failure with hypoxia   NICM (nonischemic cardiomyopathy)    CHIU, STEPHEN K  [facility] Pager [phone]. If 7PM-7AM, please contact night-coverage at [email or web address], password [facility] 05/09/2015, 3:41 PM  LOS: 9 days

## 2015-05-09 NOTE — Progress Notes (Signed)
CARDIAC REHAB PHASE I   PRE:  Rate/Rhythm: 87 SR    BP: sitting 88/56    SaO2: 99 RA  MODE:  Ambulation: 500 ft   POST:  Rate/Rhythm: 106 ST    BP: sitting 106/69     SaO2: 99 RA  Pt able to walk fairly steady. No major c/o, slight dizziness at first. BP stable. To recliner after walk, happy to be in chair. Gave pt HF booklet as he couldn't find his. Sts he watched 3 HF videos. Will f/u. [phone]   Elissa Lovett [place] CES, ACSM 05/09/2015 3:18 PM

## 2015-05-09 NOTE — Progress Notes (Signed)
Advanced Heart Failure Rounding Note   Subjective:    Admitted with increased nausea and dyspnea. Cardiology consulted 8/25 newly reduced LV funciton, NSVT, and MR. Had acute PE/DVT. Has been treated with IV lasix and started on BB. Medication regimen limited by hypotension. He has not been on ace or spiro with hypotension.    8/31 Cath lab for RHC/swan placement--> cardiogenic shock. Started on milrinone 0.25 mcg and diuresed with IV lasix. 9/1/ Milrinone 0.25 mcg. Started on sprio and losartan but was hypotensive. Given 250 cc NS. CO-OX 76%.     Feeling better. Denies SOB/Orthopnea.   Todays swan numbers RA 2 PA 25/12    RHC 8/31 RA = 9 RV = 39/5/12 PA = 41/20 (28) PCW = 18 Fick cardiac output/index = 2.6/1.4 Therms cardiac output/index = 3.6/2.0 PVR = 3.8 WU FA sat = 98% PA sat = 54%, 57%   8/25 ECHO EF 10% RV dilated  8/25 + Acute DVT distal to mild popliteal vein RLE No DVT on Left  8/26 LHC -  1. Prox RCA to Dist RCA lesion, 20% stenosed. 2. Ost Cx to Dist Cx lesion, 20% stenosed. 3. Mid LAD lesion, 25% stenosed.   FH: Father --> CHF due to ischemic CM  Objective:   Weight Range:  Vital Signs:   Temp:  [93.7 F (34.3 C)-98.2 F (36.8 C)] 98.1 F (36.7 C) (09/02 0600) Pulse Rate:  [81-99] 87 (09/02 0600) Resp:  [12-23] 18 (09/02 0600) BP: (68-136)/(51-84) 88/57 mmHg (09/02 0600) SpO2:  [94 %-100 %] 96 % (09/02 0600) Weight:  [146 lb 6.2 oz (66.4 kg)] 146 lb 6.2 oz (66.4 kg) (09/02 0500) Last BM Date: 05/03/15  Weight change: Filed Weights   05/07/15 0454 05/08/15 0500 05/09/15 0500  Weight: 151 lb 3.2 oz (68.584 kg) 143 lb 4.8 oz (65 kg) 146 lb 6.2 oz (66.4 kg)    Intake/Output:   Intake/Output Summary (Last 24 hours) at 05/09/15 0819 Last data filed at 05/09/15 0600  Gross per 24 hour  Intake 1499.46 ml  Output   1400 ml  Net  99.46 ml     Physical Exam: CVP 2 General:  Fatigued appearing. No resp difficulty. Sitting in bed  HEENT:  normal  Neck: supple. RIJ swan  Carotids 2+ bilat; no bruits. No lymphadenopathy or thryomegaly appreciated. Cor: PMI nondisplaced. Regular rate & rhythm. No rubs, or murmurs. + s3  Lungs: clear Abdomen: soft, nontender, non- distended. No hepatosplenomegaly. No bruits or masses. Good bowel sounds. Extremities: no cyanosis, clubbing, rash, trace lower extremity edema.  Neuro: alert & orientedx3, cranial nerves grossly intact. moves all 4 extremities w/o difficulty. Affect pleasant  Telemetry: SR 80s  NSVT   Labs: Basic Metabolic Panel:  Recent Labs Lab 05/03/15 0217 05/04/15 0332 05/05/15 0239 05/06/15 0500 05/07/15 1206 05/08/15 0505 05/08/15 0815 05/09/15 0510  NA 131*  --  132* 133* 135 136  --  133*  K 4.5  --  4.0 4.0 4.5 4.0  --  4.2  CL 98*  --  98* 101 98* 98*  --  101  CO2 22  --  25 23 30 29   --  26  GLUCOSE 144*  --  114* 106* 81 82  --  88  BUN 25*  --  21* 20 20 17   --  15  CREATININE 1.29*  --  1.29* 1.09 1.15 0.96  --  0.94  CALCIUM 8.4*  --  8.3* 8.5* 9.0 8.6*  --  8.3*  MG 2.1 1.9 2.0  --   --   --  2.0  --     Liver Function Tests:  Recent Labs Lab 05/03/15 0217 05/05/15 0239  AST 42* 27  ALT 129* 83*  ALKPHOS 90 76  BILITOT 1.3* 1.1  PROT 5.6* 5.5*  ALBUMIN 3.1* 2.9*   No results for input(s): LIPASE, AMYLASE in the last 168 hours. No results for input(s): AMMONIA in the last 168 hours.  CBC:  Recent Labs Lab 05/03/15 0217 05/04/15 0332 05/07/15 1206 05/08/15 0505  WBC 8.9 7.2 7.8 6.4  HGB 15.2 14.7 16.2 16.2  HCT 44.5 43.8 49.1 48.1  MCV 90.3 92.2 93.2 91.8  PLT 173 144* 197 159    Cardiac Enzymes: No results for input(s): CKTOTAL, CKMB, CKMBINDEX, TROPONINI in the last 168 hours.  BNP: BNP (last 3 results)  Recent Labs  05/01/15 0215  BNP 2662.2*    ProBNP (last 3 results) No results for input(s): PROBNP in the last 8760 hours.    Other results:  Imaging: No results found.   Medications:     Scheduled  Medications: . clonazePAM  1 mg Oral QHS  . clonazePAM  1 mg Oral Once  . coumadin book   Does not apply Once  . digoxin  0.25 mg Oral Daily  . gabapentin  300 mg Oral QHS  . magnesium oxide  200 mg Oral Daily  . mometasone-formoterol  2 puff Inhalation BID  . sodium chloride  3 mL Intravenous Q12H  . sodium chloride  3 mL Intravenous Q12H  . sodium chloride  3 mL Intravenous Q12H  . sodium chloride  3 mL Intravenous Q12H  . warfarin  7.5 mg Oral ONCE-1800  . warfarin   Does not apply Once  . Warfarin - Pharmacist Dosing Inpatient   Does not apply q1800    Infusions: . heparin 1,400 Units/hr (05/09/15 0258)  . milrinone 0.25 mcg/kg/min (05/09/15 0525)  . norepinephrine (LEVOPHED) Adult infusion Stopped (05/08/15 1630)    PRN Medications: sodium chloride, sodium chloride, sodium chloride, acetaminophen, albuterol, ondansetron (ZOFRAN) IV, oxyCODONE-acetaminophen, sodium chloride, sodium chloride, sodium chloride   Assessment:  1. A/C Biventricular HF 2. Acute DVT RLE 3. Acute PE- RLL noted on CT 4. Severe MR 5. NSVT 6. Cardiogenic Shock     Plan/Discussion:   RHC with evidence of cardiogenic shock. Hemodynamics improved today on milrinone 0.25 mcg.  CO-OX today 76% Cut back milrinone to 0.125 mcg. No diuretics today. No bb/ace with low output and hypotension. Renal function stable today.   On heparin for acute/DVT/PE. Start coumadin. Will need anticoagulants at least 6 months. In the event he needs LVAD will use coumadin instead of xarelto.    NSVT- Check Mag -->2.0 K 4.2.   He does have limited social support locally to this may be a factor.     Anticipate involving palliative care for goals of care.   Consult cardiac rehab.    Length of Stay: 9  CLEGG,AMY NP-C  05/09/2015, 8:19 AM  Advanced Heart Failure Team Pager [phone] (M-F; 7a - 4p)  Please contact [facility] for night-coverage after hours (4p -7a ) and weekends on [email or web address]  Patient seen and  examined with Tonye Becket, NP. We discussed all aspects of the encounter. I agree with the assessment and plan as stated above.   Now off levophed. Remains on milrinone. Still tenuous. Hold diuretics. Start digoxin. Over weekend will work to get afterload reduction on board and to wean milrinone.  He is very reluctant about considering home inotropes or VAD. Agree with coumadin for DVT/PE. Cardiac rehab to see. Place PICC and pull Swan.   The patient is critically ill with multiple organ systems failure and requires high complexity decision making for assessment and support, frequent evaluation and titration of therapies, application of advanced monitoring technologies and extensive interpretation of multiple databases.   Critical Care Time devoted to patient care services described in this note is 35 Minutes.  Takashi Korol,MD 1:54 PM

## 2015-05-09 NOTE — Progress Notes (Addendum)
Patients sister called from [place] for an update on her brothers status and to talk about his insurance forms. His forms were reviewed with case management Junius Creamer) and appear to be in order. Forms were placed back in patients chart. Patients sister said that she would be traveling form [place] in the morning to visit her brother and retrieve the forms.

## 2015-05-09 NOTE — Progress Notes (Addendum)
ANTICOAGULATION CONSULT NOTE - Follow Up Consult  Pharmacy Consult for Heparin and Coumadin Indication: DVT/PE  Allergies  Allergen Reactions  . Aspirin     REACTION: facial swelling  . Dust Mite Extract   . Other     Cats   . Peanuts [Peanut Oil]     sob    Patient Measurements: Height: 5' 8.5" (174 cm) Weight: 146 lb 6.2 oz (66.4 kg) IBW/kg (Calculated) : 69.55  Vital Signs: Temp: 98.1 F (36.7 C) (09/02 0900) BP: 111/68 mmHg (09/02 0900) Pulse Rate: 102 (09/02 0900)  Labs:  Recent Labs  05/07/15 1206 05/08/15 0505  05/08/15 0815 05/08/15 1536 05/08/15 2225 05/09/15 0510 05/09/15 1000  HGB 16.2 16.2  --   --   --   --   --   --   HCT 49.1 48.1  --   --   --   --   --   --   PLT 197 159  --   --   --   --   --   --   APTT  --   --   < > 97* 106* 63*  --  87*  LABPROT 28.3*  --   --   --   --   --   --  16.9*  INR 2.70*  --   --   --   --   --   --  1.37  HEPARINUNFRC  --   --   --  >2.20*  --   --   --  0.70  CREATININE 1.15 0.96  --   --   --   --  0.94  --   < > = values in this interval not displayed.  Estimated Creatinine Clearance: 79.5 mL/min (by C-G formula based on Cr of 0.94).   Medications: Heparin @ 1400 units/hr  Assessment: 58yom initially started on IV heparin for his new RLL PE (per CT abd/pelvis 8/24) and RLE DVT (per dopplers 8/25). He was transitioned to xarelto on 8/28. Heart failure team consulted for possible advanced therapies. Xarelto was held (last dose 8/31 at 0940) and he was started on IV heparin 9/1. Initially used aPTTs to monitor heparin as xarelto falsely elevates heparin levels. Both aPTT and heparin level are therapeutic today and beginning to correlate at 87 and 0.7 respectively. Will check both one more time and then can probably use just heparin levels.    Xarelto will also be switched to coumadin given the likelihood of advanced therapies in the near future. He was to begin coumadin last night however dose was not given.  INR down to 1.37 today (indicating xarelto has cleared). Coumadin score = 6. Will need at least a 5 day overlap with heparin and coumadin for acute VTE.  Goal of Therapy:  INR 2-3 APTT 66-102 seconds Heparin level 0.3-0.7 units/ml Monitor platelets by anticoagulation protocol: Yes   Plan:  1) Continue heparin at 1400 units/hr 2) Check aPTT and heparin level in 6 hours to confirm 3) Coumadin 7.5mg  x 1 4) INR in AM  Fredrik Rigger 05/09/2015,12:22 PM   ADDENDUM:  HL is supratherapeutic at 0.88 and aPTT is also elevated at 126. Levels seem to be correlating so will check heparin levels going forward.  Plan: Decrease heparin gtt to 1250 units/hr Check 6 hr HL Monitor daily HL, INR, CBC, s/s of bleed

## 2015-05-10 LAB — BASIC METABOLIC PANEL
Anion gap: 7 (ref 5–15)
BUN: 16 mg/dL (ref 6–20)
CALCIUM: 8.8 mg/dL — AB (ref 8.9–10.3)
CO2: 28 mmol/L (ref 22–32)
CREATININE: 1.1 mg/dL (ref 0.61–1.24)
Chloride: 101 mmol/L (ref 101–111)
GFR calc Af Amer: >60 mL/min (ref >60)
GLUCOSE: 96 mg/dL (ref 65–99)
Potassium: 4.5 mmol/L (ref 3.5–5.1)
SODIUM: 136 mmol/L (ref 135–145)

## 2015-05-10 LAB — PROTIME-INR
INR: 1.28 (ref 0.00–1.49)
Prothrombin Time: 16.1 seconds — ABNORMAL HIGH (ref 11.6–15.2)

## 2015-05-10 LAB — HEPARIN LEVEL (UNFRACTIONATED)
HEPARIN UNFRACTIONATED: 0.78 [IU]/mL — AB (ref 0.30–0.70)
HEPARIN UNFRACTIONATED: 0.49 [IU]/mL (ref 0.30–0.70)
Heparin Unfractionated: 0.7 IU/mL (ref 0.30–0.70)

## 2015-05-10 LAB — CARBOXYHEMOGLOBIN
Carboxyhemoglobin: 1.3 % (ref 0.5–1.5)
Methemoglobin: 0.7 % (ref 0.0–1.5)
O2 Saturation: 67.6 %
TOTAL HEMOGLOBIN: 15.9 g/dL (ref 13.5–18.0)

## 2015-05-10 MED ORDER — SODIUM CHLORIDE 0.9 % IJ SOLN
10.0000 mL | Freq: Two times a day (BID) | INTRAMUSCULAR | Status: DC
  Administered 2015-05-10 – 2015-05-16 (×11): 10 mL

## 2015-05-10 MED ORDER — HEPARIN (PORCINE) IN NACL 100-0.45 UNIT/ML-% IJ SOLN
1150.0000 [IU]/h | INTRAMUSCULAR | Status: DC
  Administered 2015-05-11: 1150 [IU]/h via INTRAVENOUS
  Filled 2015-05-10: qty 250

## 2015-05-10 MED ORDER — SODIUM CHLORIDE 0.9 % IJ SOLN: 10.0000 mL | INTRAMUSCULAR | Status: DC | PRN

## 2015-05-10 MED ORDER — WARFARIN SODIUM 7.5 MG PO TABS
7.5000 mg | ORAL_TABLET | Freq: Once | ORAL | Status: AC
  Administered 2015-05-10: 7.5 mg via ORAL
  Filled 2015-05-10: qty 1

## 2015-05-10 NOTE — Progress Notes (Signed)
ANTICOAGULATION CONSULT NOTE - Follow Up Consult  Pharmacy Consult for Heparin  Indication: pulmonary embolus and DVT  Allergies  Allergen Reactions  . Aspirin     REACTION: facial swelling  . Dust Mite Extract   . Other     Cats   . Peanuts [Peanut Oil]     sob   Patient Measurements: Height: 5' 8.5" (174 cm) Weight: 146 lb 6.2 oz (66.4 kg) IBW/kg (Calculated) : 69.55  Vital Signs: Temp: 98 F (36.7 C) (09/02 2000) Temp Source: Oral (09/02 2000) BP: 97/66 mmHg (09/02 2300) Pulse Rate: 87 (09/02 2300)  Labs:  Recent Labs  05/07/15 1206 05/08/15 0505  05/08/15 2225 05/09/15 0510 05/09/15 1000 05/09/15 1625 05/10/15 0021  HGB 16.2 16.2  --   --   --   --   --   --   HCT 49.1 48.1  --   --   --   --   --   --   PLT 197 159  --   --   --   --   --   --   APTT  --   --   < > 63*  --  87* 126*  --   LABPROT 28.3*  --   --   --   --  16.9*  --  16.1*  INR 2.70*  --   --   --   --  1.37  --  1.28  HEPARINUNFRC  --   --   < >  --   --  0.70 0.88* 0.70  CREATININE 1.15 0.96  --   --  0.94  --   --   --   < > = values in this interval not displayed.  Estimated Creatinine Clearance: 79.5 mL/min (by C-G formula based on Cr of 0.94).   Assessment: Therapeutic heparin level (high end of range) after rate decrease. Will leave as is given PE/DVT.   Goal of Therapy:  Heparin level 0.3-0.7 units/ml Monitor platelets by anticoagulation protocol: Yes   Plan:  -Continue heparin at 1250 units/hr -0800 HL -Daily CBC/HL -Monitor for bleeding  Abran Duke 05/10/2015,1:37 AM

## 2015-05-10 NOTE — Progress Notes (Signed)
Pt walked in hall. Did 2 laps around unit.  When pt almost finished with 2nd lap. Pt felt chest tightness.  Pt sitting on side of bed.  bp 109/74 hr 104 o2 sats 98%.   pt on 2Lnc and few minutes later  pt stated " feeling better" . Will continue to monitor. Call bell in reach.  Karena Addison T

## 2015-05-10 NOTE — Progress Notes (Signed)
Peripherally Inserted Central Catheter/Midline Placement  The IV Nurse has discussed with the patient and/or persons authorized to consent for the patient, the purpose of this procedure and the potential benefits and risks involved with this procedure.  The benefits include less needle sticks, lab draws from the catheter and patient may be discharged home with the catheter.  Risks include, but not limited to, infection, bleeding, blood clot (thrombus formation), and puncture of an artery; nerve damage and irregular heat beat.  Alternatives to this procedure were also discussed.  PICC/Midline Placement Documentation  PICC / Midline Double Lumen 05/10/15 PICC Brachial 41 cm 0 cm (Active)  Indication for Insertion or Continuance of Line Vasoactive infusions 05/10/2015 11:03 AM  Exposed Catheter (cm) 0 cm 05/10/2015 11:03 AM  Site Assessment Clean;Dry;Intact 05/10/2015 11:03 AM  Lumen #1 Status Flushed;Saline locked;Blood return noted 05/10/2015 11:03 AM  Lumen #2 Status Flushed;Saline locked;Blood return noted 05/10/2015 11:03 AM  Dressing Type Transparent 05/10/2015 11:03 AM  Dressing Status Clean;Dry;Intact 05/10/2015 11:03 AM  Dressing Change Due 05/17/15 05/10/2015 11:03 AM       Ethelda Chick 05/10/2015, 11:05 AM

## 2015-05-10 NOTE — Progress Notes (Signed)
CARDIAC REHAB PHASE I   PRE:  Rate/Rhythm: 85 sinus rhythm  BP:  Supine:   Sitting: 101/70  Standing:    SaO2: 96% ra  MODE:  Ambulation: 200 ft   POST:  Rate/Rhythem: 97 sinus rhythm  BP:  Supine:   Sitting: 110/79  Standing:    SaO2: 95% ra   1430-1445  Pt ambulated in hallway x1 assist using rolling walker, slow steady gait.  Pt c/o mild dizziness, otherwise asymptomatic. Pt requested to walk additional 50 feet.  Pt returned to chair, call light in reach.   Shawny Borkowski, Kenel

## 2015-05-10 NOTE — Progress Notes (Signed)
ANTICOAGULATION CONSULT NOTE - Follow Up Consult  Pharmacy Consult for Heparin Indication: pulmonary embolus and DVT  Allergies  Allergen Reactions  . Aspirin     REACTION: facial swelling  . Dust Mite Extract   . Other     Cats   . Peanuts [Peanut Oil]     sob   Patient Measurements: Height: 5' 8.5" (174 cm) Weight: 144 lb 10 oz (65.6 kg) IBW/kg (Calculated) : 69.55  Vital Signs: Temp: 97.9 F (36.6 C) (09/03 1150) Temp Source: Oral (09/03 1150) BP: 96/74 mmHg (09/03 1200) Pulse Rate: 90 (09/03 1200)  Labs:  Recent Labs  05/08/15 0505  05/08/15 2225 05/09/15 0510 05/09/15 1000 05/09/15 1625 05/10/15 0021 05/10/15 0928 05/10/15 1827  HGB 16.2  --   --   --   --   --   --   --   --   HCT 48.1  --   --   --   --   --   --   --   --   PLT 159  --   --   --   --   --   --   --   --   APTT  --   < > 63*  --  87* 126*  --   --   --   LABPROT  --   --   --   --  16.9*  --  16.1*  --   --   INR  --   --   --   --  1.37  --  1.28  --   --   HEPARINUNFRC  --   < >  --   --  0.70 0.88* 0.70 0.78* 0.49  CREATININE 0.96  --   --  0.94  --   --  1.10  --   --   < > = values in this interval not displayed.  Estimated Creatinine Clearance: 67.1 mL/min (by C-G formula based on Cr of 1.1).   Assessment: 58yom initially started on IV heparin for his new RLL PE (per CT abd/pelvis 8/24) and RLE DVT (per dopplers 8/25). He was transitioned to xarelto on 8/28. He has now been transitioned to Coumadin in case he needs LVAD.   He was to begin coumadin on 9/1 however dose was not given that night and last night's dose was not entered. INR now down to 1.28. Coumadin score = 6. Will need at least a 5 day overlap with heparin and coumadin for acute VTE. Dose was charted as given this evening.  heparin level this morning was elevated and rate was decreased. Now therapeutic at 0.49 units/mL. No bleeding noted.  Goal of Therapy:  Heparin level 0.3-0.7 units/ml Monitor platelets by  anticoagulation protocol: Yes   Plan:  - Continue heparin at 1150 units/hr - Daily CBC/HL - Monitor for bleeding  Kire Ferg D. Arnisha Laffoon, PharmD, BCPS Clinical Pharmacist Pager: [phone] 05/10/2015 6:54 PM

## 2015-05-10 NOTE — Progress Notes (Signed)
[facility] PROGRESS NOTE  Marcus Bates AST:[id number] DOB: [birth year] DOA: 04/30/2015 PCP: Lolita Patella, MD  Assessment/Plan: Acute systolic and diastolic CHF with nonischemic cardiomyopathy -Cardiac cath revealed nonobstructive disease. Currently on Lasix 40 mg PO daily. Had been on low dose beta blocker and ramipril. Given low BP, have d/c'd ramipril. Cardiology has since d/c'd metoprolol as of 8/31.  -Pt with severe mitral regurgitation on echo. Cardiology and EP do not recommend TEE and role for valve repair at this time given severe regurgitation of unknown duration. -Cardiac MRI shows severe biventricular dilatation and impaired function and no LV thrombus seen. -Recommend to repeat a 2-D echo in 3 months to evaluate his heart function. - Patient underwent  RHC on 8/31 with findings of low output HF with cardiogenic shock physiology - Patient remains on milrinone with plans to titrate off as tolerated - Now off lasix, recently d/c'd beta blocker and acei given hypotension  Increasing dyspnea during the night with? Apnea -Patient recently reported waking up during feeling suffocated and gasping for air. Sats noted to be normal. Patient had been followed by Dr. Maple Hudson for excessive sleepiness and had sleep study in 8/2015consistent with nonspecific hypersomnia and ruled out for sleep apnea. -ABG was unremarkable. Possibly related to orthopnea and PND. Keep head of bed elevated at 30 at all times. -No o2 desaturation at rest or ambulation. No nocturnal o2 desat. Sniff test negative for diaphragmatic muscle denervation  -Patient states feeling better  Severe MR -Possibly secondary to cardiomyopathy.  -No role for TEE and valve replacement at this time as it will not improve his heart function.  Acute DVT with right sided PE -Was started on IV heparin and switched to oral Xarelto on 8/28, changed to coumadin on 9/1 given social issues -Plan is for at least 6 mos, if not  longer, anticoagulation given his on prolonged venous thromboembolic is in an severe cardiomyopathy and severe MR.  Severe MR -Possibly Secondary to cardiomyopathy. No role for TEE and valve replacement at this time as it will not improve his heart function.   Nonsustained V. tach -Evaluated by EP. Plan to repeat echo in 3 months for consideration of ICD. No indication for antiarrhythmics or LifeVest at this time. -Has multiple PVCs on the monitor. Asymptomatic. Unable to increase beta blocker dose due to hypotension. Potassium and Magnesium normal.   Hypotension -Possibly associated with severe CHF. Patient is asymptomatic. D/c'd beta blocker and ACEI. Pt remains on digoxin and lasix -Stable   Transaminitis -Possibly hepatic ischemia with CHF. Liver and gallbladder on admission CT unremarkable. LFTs improving.   DVT prophylaxis: Had been on Xarelto, transition to coumadin  Code Status: Full Family Communication: Pt in room Disposition Plan: Pending   Consultants:  Cardiology  Critical Care  Procedures:  Cath 8/26  RHC 8/31  Antibiotics:   (indicate start date, and stop date if known)  HPI/Subjective: Without complaints today. Eager to go home  Objective: Filed Vitals:   05/10/15 1000 05/10/15 1100 05/10/15 1150 05/10/15 1200  BP: 96/63 98/59  96/74  Pulse: 79 84  90  Temp:   97.9 F (36.6 C)   TempSrc:   Oral   Resp: 14 16  13   Height:      Weight:      SpO2: 96% 96%  96%    Intake/Output Summary (Last 24 hours) at 05/10/15 1613 Last data filed at 05/10/15 1300  Gross per 24 hour  Intake 1263.7 ml  Output   3200 ml  Net -1936.3 ml   Filed Weights   05/08/15 0500 05/09/15 0500 05/10/15 0500  Weight: 65 kg (143 lb 4.8 oz) 66.4 kg (146 lb 6.2 oz) 65.6 kg (144 lb 10 oz)    Exam:   General:  Awake, sitting in bed,  in nad  Cardiovascular: regular, s1, s2  Respiratory: normal resp effort, no wheezing  Abdomen: soft,nondistended,  nontender  Musculoskeletal: perfused, no clubbing, no cyanosis  Data Reviewed: Basic Metabolic Panel:  Recent Labs Lab 05/04/15 0332  05/05/15 0239 05/06/15 0500 05/07/15 1206 05/08/15 0505 05/08/15 0815 05/09/15 0510 05/10/15 0021  NA  --   < > 132* 133* 135 136  --  133* 136  K  --   < > 4.0 4.0 4.5 4.0  --  4.2 4.5  CL  --   < > 98* 101 98* 98*  --  101 101  CO2  --   < > --  26 28  GLUCOSE  --   < > 114* 106* 81 82  --  88 96  BUN  --   < > 21* --  15 16  CREATININE  --   < > 1.29* 1.09 1.15 0.96  --  0.94 1.10  CALCIUM  --   < > 8.3* 8.5* 9.0 8.6*  --  8.3* 8.8*  MG 1.9  --  2.0  --   --   --  2.0  --   --   < > = values in this interval not displayed. Liver Function Tests:  Recent Labs Lab 05/05/15 0239  AST 27  ALT 83*  ALKPHOS 76  BILITOT 1.1  PROT 5.5*  ALBUMIN 2.9*   No results for input(s): LIPASE, AMYLASE in the last 168 hours. No results for input(s): AMMONIA in the last 168 hours. CBC:  Recent Labs Lab 05/04/15 0332 05/07/15 1206 05/08/15 0505  WBC 7.2 7.8 6.4  HGB 14.7 16.2 16.2  HCT 43.8 49.1 48.1  MCV 92.2 93.2 91.8  PLT 144* 197 159   Cardiac Enzymes: No results for input(s): CKTOTAL, CKMB, CKMBINDEX, TROPONINI in the last 168 hours. BNP (last 3 results)  Recent Labs  05/01/15 0215  BNP 2662.2*    ProBNP (last 3 results) No results for input(s): PROBNP in the last 8760 hours.  CBG: No results for input(s): GLUCAP in the last 168 hours.  Recent Results (from the past 240 hour(s))  MRSA PCR Screening     Status: None   Collection Time: 05/09/15  9:20 AM  Result Value Ref Range Status   MRSA by PCR NEGATIVE NEGATIVE Final    Comment:        The GeneXpert MRSA Assay (FDA approved for NASAL specimens only), is one component of a comprehensive MRSA colonization surveillance program. It is not intended to diagnose MRSA infection nor to guide or monitor treatment for MRSA infections.       Studies: No results found.  Scheduled Meds: . clonazePAM  1 mg Oral QHS  . clonazePAM  1 mg Oral Once  . coumadin book   Does not apply Once  . digoxin  0.25 mg Oral Daily  . gabapentin  300 mg Oral QHS  . magnesium oxide  200 mg Oral Daily  . mometasone-formoterol  2 puff Inhalation BID  . sodium chloride  10-40 mL Intracatheter Q12H  . sodium chloride  3 mL Intravenous Q12H  . sodium chloride  3 mL Intravenous  Q12H  . sodium chloride  3 mL Intravenous Q12H  . sodium chloride  3 mL Intravenous Q12H  . warfarin  7.5 mg Oral ONCE-1800  . warfarin   Does not apply Once  . Warfarin - Pharmacist Dosing Inpatient   Does not apply q1800   Continuous Infusions: . heparin 1,150 Units/hr (05/10/15 1000)  . milrinone 0.125 mcg/kg/min (05/10/15 0300)    Principal Problem:   Acute combined systolic and diastolic congestive heart failure Active Problems:   Moderate persistent asthma in adult without complication   Cardiomegaly   Abnormal LFTs   PE (pulmonary embolism)   Severe mitral regurgitation   LV (left ventricular) mural thrombus   NSVT (nonsustained ventricular tachycardia)   Renal insufficiency   AKI (acute kidney injury)   SOB (shortness of breath)   Other specified hypotension   Acute deep vein thrombosis (DVT) of popliteal vein of right lower extremity   Acute respiratory failure with hypoxia   NICM (nonischemic cardiomyopathy)    Gumecindo Hopkin K  [facility] Pager [phone]. If 7PM-7AM, please contact night-coverage at [email or web address], password [facility] 05/10/2015, 4:13 PM  LOS: 10 days

## 2015-05-10 NOTE — Progress Notes (Addendum)
ANTICOAGULATION CONSULT NOTE - Follow Up Consult  Pharmacy Consult for Heparin and Warfarin Indication: pulmonary embolus and DVT  Allergies  Allergen Reactions  . Aspirin     REACTION: facial swelling  . Dust Mite Extract   . Other     Cats   . Peanuts [Peanut Oil]     sob   Patient Measurements: Height: 5' 8.5" (174 cm) Weight: 144 lb 10 oz (65.6 kg) IBW/kg (Calculated) : 69.55  Vital Signs: Temp: 97.8 F (36.6 C) (09/03 0700) Temp Source: Oral (09/03 0700) BP: 99/76 mmHg (09/03 0700) Pulse Rate: 82 (09/03 0700)  Labs:  Recent Labs  05/07/15 1206 05/08/15 0505  05/08/15 2225 05/09/15 0510 05/09/15 1000 05/09/15 1625 05/10/15 0021 05/10/15 0928  HGB 16.2 16.2  --   --   --   --   --   --   --   HCT 49.1 48.1  --   --   --   --   --   --   --   PLT 197 159  --   --   --   --   --   --   --   APTT  --   --   < > 63*  --  87* 126*  --   --   LABPROT 28.3*  --   --   --   --  16.9*  --  16.1*  --   INR 2.70*  --   --   --   --  1.37  --  1.28  --   HEPARINUNFRC  --   --   < >  --   --  0.70 0.88* 0.70 0.78*  CREATININE 1.15 0.96  --   --  0.94  --   --  1.10  --   < > = values in this interval not displayed.  Estimated Creatinine Clearance: 67.1 mL/min (by C-G formula based on Cr of 1.1).   Assessment: 58yom initially started on IV heparin for his new RLL PE (per CT abd/pelvis 8/24) and RLE DVT (per dopplers 8/25). He was transitioned to xarelto on 8/28. He has now been transitioned to Coumadin in case needs LVAD.   He was to begin coumadin on 9/1 however dose was not given that night and last night's dose was not entered. INR now down to 1.28. Coumadin score = 6. Will need at least a 5 day overlap with heparin and coumadin for acute VTE.  Goal of Therapy:  Heparin level 0.3-0.7 units/ml Monitor platelets by anticoagulation protocol: Yes   Plan:  - Decrease heparin to 1150 units/hr - F/u 6 hour HL - Coumadin 7.5 mg PO x 1 tonight - Daily CBC/HL/INR -  Monitor for bleeding  Dartagnan Beavers K. Bonnye Fava, PharmD, BCPS, CPP Clinical Pharmacist Pager: [phone] Phone: [phone] 05/10/2015 10:01 AM

## 2015-05-10 NOTE — Progress Notes (Signed)
Advanced Heart Failure Rounding Note   Subjective:     8/31 Cath lab for RHC/swan placement--> cardiogenic shock. Started on milrinone 0.25 mcg and diuresed with IV lasix. 9/1/ Milrinone 0.25 mcg. Started on sprio and losartan but was hypotensive. Given 250 cc NS. CO-OX 76%.     Ernestine Conrad out. PICC placed On milrinone 0.125. Off norepi. SBP 95-105. Co-ox 68%. On heparin/coumadin for DVT/PE. Off lasix. Feels stronger. No dyspnea.   RHC 8/31 RA = 9 RV = 39/5/12 PA = 41/20 (28) PCW = 18 Fick cardiac output/index = 2.6/1.4 Therms cardiac output/index = 3.6/2.0 PVR = 3.8 WU FA sat = 98% PA sat = 54%, 57%   8/25 ECHO EF 10% RV dilated  8/25 + Acute DVT distal to mild popliteal vein RLE No DVT on Left  8/26 LHC -  1. Prox RCA to Dist RCA lesion, 20% stenosed. 2. Ost Cx to Dist Cx lesion, 20% stenosed. 3. Mid LAD lesion, 25% stenosed.   FH: Father --> CHF due to ischemic CM  Objective:   Weight Range:  Vital Signs:   Temp:  [97.7 F (36.5 C)-98.1 F (36.7 C)] 97.9 F (36.6 C) (09/03 1150) Pulse Rate:  [78-98] 84 (09/03 1100) Resp:  [10-19] 16 (09/03 1100) BP: (96-109)/(59-86) 98/59 mmHg (09/03 1100) SpO2:  [92 %-100 %] 96 % (09/03 1100) Weight:  [65.6 kg (144 lb 10 oz)] 65.6 kg (144 lb 10 oz) (09/03 0500) Last BM Date: 05/03/15  Weight change: Filed Weights   05/08/15 0500 05/09/15 0500 05/10/15 0500  Weight: 65 kg (143 lb 4.8 oz) 66.4 kg (146 lb 6.2 oz) 65.6 kg (144 lb 10 oz)    Intake/Output:   Intake/Output Summary (Last 24 hours) at 05/10/15 1209 Last data filed at 05/10/15 1151  Gross per 24 hour  Intake 1125.7 ml  Output   3200 ml  Net -2074.3 ml     Physical Exam: General:  Lying flat in bed HEENT: normal  Neck: supple. No jvd Carotids 2+ bilat; no bruits. No lymphadenopathy or thryomegaly appreciated. Cor: PMI nondisplaced. Regular rate & rhythm. No rubs, or murmurs. + s3  Lungs: clear Abdomen: soft, nontender, non- distended. No hepatosplenomegaly.  No bruits or masses. Good bowel sounds. Extremities: no cyanosis, clubbing, rash,no edema. Warm Neuro: alert & orientedx3, cranial nerves grossly intact. moves all 4 extremities w/o difficulty. Affect pleasant  Telemetry: SR 80s   Labs: Basic Metabolic Panel:  Recent Labs Lab 05/04/15 0332  05/05/15 0239 05/06/15 0500 05/07/15 1206 05/08/15 0505 05/08/15 0815 05/09/15 0510 05/10/15 0021  NA  --   < > 132* 133* 135 136  --  133* 136  K  --   < > 4.0 4.0 4.5 4.0  --  4.2 4.5  CL  --   < > 98* 101 98* 98*  --  101 101  CO2  --   < > --  26 28  GLUCOSE  --   < > 114* 106* 81 82  --  88 96  BUN  --   < > 21* --  15 16  CREATININE  --   < > 1.29* 1.09 1.15 0.96  --  0.94 1.10  CALCIUM  --   < > 8.3* 8.5* 9.0 8.6*  --  8.3* 8.8*  MG 1.9  --  2.0  --   --   --  2.0  --   --   < > =  values in this interval not displayed.  Liver Function Tests:  Recent Labs Lab 05/05/15 0239  AST 27  ALT 83*  ALKPHOS 76  BILITOT 1.1  PROT 5.5*  ALBUMIN 2.9*   No results for input(s): LIPASE, AMYLASE in the last 168 hours. No results for input(s): AMMONIA in the last 168 hours.  CBC:  Recent Labs Lab 05/04/15 0332 05/07/15 1206 05/08/15 0505  WBC 7.2 7.8 6.4  HGB 14.7 16.2 16.2  HCT 43.8 49.1 48.1  MCV 92.2 93.2 91.8  PLT 144* 197 159    Cardiac Enzymes: No results for input(s): CKTOTAL, CKMB, CKMBINDEX, TROPONINI in the last 168 hours.  BNP: BNP (last 3 results)  Recent Labs  05/01/15 0215  BNP 2662.2*    ProBNP (last 3 results) No results for input(s): PROBNP in the last 8760 hours.    Other results:  Imaging: No results found.   Medications:     Scheduled Medications: . clonazePAM  1 mg Oral QHS  . clonazePAM  1 mg Oral Once  . coumadin book   Does not apply Once  . digoxin  0.25 mg Oral Daily  . gabapentin  300 mg Oral QHS  . magnesium oxide  200 mg Oral Daily  . mometasone-formoterol  2 puff Inhalation BID  . sodium chloride   10-40 mL Intracatheter Q12H  . sodium chloride  3 mL Intravenous Q12H  . sodium chloride  3 mL Intravenous Q12H  . sodium chloride  3 mL Intravenous Q12H  . sodium chloride  3 mL Intravenous Q12H  . warfarin  7.5 mg Oral ONCE-1800  . warfarin   Does not apply Once  . Warfarin - Pharmacist Dosing Inpatient   Does not apply q1800    Infusions: . heparin 1,150 Units/hr (05/10/15 1000)  . milrinone 0.125 mcg/kg/min (05/10/15 0300)  . norepinephrine (LEVOPHED) Adult infusion Stopped (05/08/15 1630)    PRN Medications: sodium chloride, sodium chloride, sodium chloride, acetaminophen, albuterol, ondansetron (ZOFRAN) IV, oxyCODONE-acetaminophen, sodium chloride, sodium chloride, sodium chloride, sodium chloride   Assessment:  1. A/C Biventricular HF 2. Acute DVT RLE 3. Acute PE- RLL noted on CT 4. Severe MR 5. NSVT 6. Cardiogenic Shock   Plan/Discussion:    Improving slowly. Now fully diuresed. Remains on low dose milrinone. Co-ox improving. Will start low-dose spiro. No lasix for now. Consult cardiac Rehab. Move to SDU. He is VERY reluctant about considering home inotropes or VAD therapies.   Loading coumadin for DVT/PE.    Ellar Hakala,MD 12:09 PM Advanced Heart Failure Team Pager [phone] (M-F; 7a - 4p)  Please contact [facility] for night-coverage after hours (4p -7a ) and weekends on [email or web address]

## 2015-05-11 DIAGNOSIS — I82431 Acute embolism and thrombosis of right popliteal vein: Secondary | ICD-10-CM

## 2015-05-11 LAB — BASIC METABOLIC PANEL
ANION GAP: 7 (ref 5–15)
BUN: 15 mg/dL (ref 6–20)
CALCIUM: 8.8 mg/dL — AB (ref 8.9–10.3)
CHLORIDE: 101 mmol/L (ref 101–111)
CO2: 27 mmol/L (ref 22–32)
Creatinine, Ser: 0.97 mg/dL (ref 0.61–1.24)
GFR calc non Af Amer: >60 mL/min (ref >60)
Glucose, Bld: 87 mg/dL (ref 65–99)
Potassium: 4.5 mmol/L (ref 3.5–5.1)
SODIUM: 135 mmol/L (ref 135–145)

## 2015-05-11 LAB — CBC
HCT: 47.5 % (ref 39.0–52.0)
HEMOGLOBIN: 15.8 g/dL (ref 13.0–17.0)
MCH: 30.6 pg (ref 26.0–34.0)
MCHC: 33.3 g/dL (ref 30.0–36.0)
MCV: 91.9 fL (ref 78.0–100.0)
Platelets: 149 10*3/uL — ABNORMAL LOW (ref 150–400)
RBC: 5.17 MIL/uL (ref 4.22–5.81)
RDW: 15.1 % (ref 11.5–15.5)
WBC: 5.1 10*3/uL (ref 4.0–10.5)

## 2015-05-11 LAB — PROTIME-INR
INR: 1.17 (ref 0.00–1.49)
PROTHROMBIN TIME: 15.1 s (ref 11.6–15.2)

## 2015-05-11 LAB — HEPARIN LEVEL (UNFRACTIONATED)
HEPARIN UNFRACTIONATED: 0.5 [IU]/mL (ref 0.30–0.70)
Heparin Unfractionated: 0.35 IU/mL (ref 0.30–0.70)

## 2015-05-11 LAB — CARBOXYHEMOGLOBIN
Carboxyhemoglobin: 1.3 % (ref 0.5–1.5)
Methemoglobin: 0.6 % (ref 0.0–1.5)
O2 Saturation: 78.2 %
TOTAL HEMOGLOBIN: 16.2 g/dL (ref 13.5–18.0)

## 2015-05-11 LAB — DIGOXIN LEVEL: Digoxin Level: 0.3 ng/mL — ABNORMAL LOW (ref 0.8–2.0)

## 2015-05-11 MED ORDER — WARFARIN SODIUM 7.5 MG PO TABS
7.5000 mg | ORAL_TABLET | Freq: Once | ORAL | Status: AC
  Administered 2015-05-11: 7.5 mg via ORAL
  Filled 2015-05-11: qty 1

## 2015-05-11 MED ORDER — SPIRONOLACTONE 25 MG PO TABS
12.5000 mg | ORAL_TABLET | Freq: Every day | ORAL | Status: DC
  Administered 2015-05-11 – 2015-05-12 (×2): 12.5 mg via ORAL
  Filled 2015-05-11 (×2): qty 1

## 2015-05-11 MED ORDER — HEPARIN (PORCINE) IN NACL 100-0.45 UNIT/ML-% IJ SOLN
1100.0000 [IU]/h | INTRAMUSCULAR | Status: DC
  Administered 2015-05-12: 1250 [IU]/h via INTRAVENOUS
  Administered 2015-05-13: 1100 [IU]/h via INTRAVENOUS
  Administered 2015-05-13: 1250 [IU]/h via INTRAVENOUS
  Administered 2015-05-14: 1100 [IU]/h via INTRAVENOUS
  Filled 2015-05-11 (×6): qty 250

## 2015-05-11 NOTE — Progress Notes (Signed)
[facility] PROGRESS NOTE  Marcus Bates OZH:[id number] DOB: [birth year] DOA: 04/30/2015 PCP: Lolita Patella, MD  Assessment/Plan: Acute systolic and diastolic CHF with nonischemic cardiomyopathy -Cardiac cath revealed nonobstructive disease. Currently on Lasix 40 mg PO daily. Had been on low dose beta blocker and ramipril. Given low BP, have d/c'd ramipril. Cardiology has since d/c'd metoprolol as of 8/31.  -Pt with severe mitral regurgitation on echo. Cardiology and EP do not recommend TEE and role for valve repair at this time given severe regurgitation of unknown duration. -Cardiac MRI shows severe biventricular dilatation and impaired function and no LV thrombus seen. -Recommend to repeat a 2-D echo in 3 months to evaluate his heart function. - Patient underwent  RHC on 8/31 with findings of low output HF with cardiogenic shock physiology - Pt off lasix, recently d/c'd beta blocker and acei given hypotension - Cont plan per Cardiology. Plan to stop milrinone, added low dose spironolactone  Increasing dyspnea during the night with? Apnea -Patient recently reported waking up during feeling suffocated and gasping for air. Sats noted to be normal. Patient had been followed by Dr. Maple Hudson for excessive sleepiness and had sleep study in 8/2015consistent with nonspecific hypersomnia and ruled out for sleep apnea. -ABG was unremarkable. Possibly related to orthopnea and PND. Keep head of bed elevated at 30 at all times. -No o2 desaturation at rest or ambulation. No nocturnal o2 desat. Sniff test negative for diaphragmatic muscle denervation  -Patient continuing to feel better  Severe MR -Possibly secondary to cardiomyopathy.  -No role for TEE and valve replacement at this time as it will not improve his heart function.  Acute DVT with right sided PE -Was started on IV heparin and switched to oral Xarelto on 8/28, changed to coumadin on 9/1 given social issues -Plan is for at  least 6 mos, if not longer, anticoagulation given his on prolonged venous thromboembolic is in an severe cardiomyopathy and severe MR.  Severe MR -Possibly Secondary to cardiomyopathy. No role for TEE and valve replacement at this time as it will not improve his heart function.   Nonsustained V. tach -Evaluated by EP. Plan to repeat echo in 3 months for consideration of ICD. No indication for antiarrhythmics or LifeVest at this time. -Has multiple PVCs on the monitor. Asymptomatic. Unable to increase beta blocker dose due to hypotension. Potassium and Magnesium normal.   Hypotension -Possibly associated with severe CHF. Patient is asymptomatic. D/c'd beta blocker and ACEI. Pt remains on digoxin and lasix -Stable   Transaminitis -Possibly hepatic ischemia with CHF. Liver and gallbladder on admission CT unremarkable. LFTs improving.   DVT prophylaxis: Had been on Xarelto, transition to coumadin  Code Status: Full Family Communication: Pt in room Disposition Plan: Pending   Consultants:  Cardiology  Critical Care  Procedures:  Cath 8/26  RHC 8/31  Antibiotics:   (indicate start date, and stop date if known)  HPI/Subjective: Pt states feeling better. Marcus Bates to go home soon  Objective: Filed Vitals:   05/10/15 2322 05/11/15 0357 05/11/15 0442 05/11/15 0823  BP: 96/69 98/70  104/67  Pulse:      Temp:  97.6 F (36.4 C)  97.6 F (36.4 C)  TempSrc:  Oral  Oral  Resp: Height:      Weight:   65 kg (143 lb 4.8 oz)   SpO2:  96%  95%    Intake/Output Summary (Last 24 hours) at 05/11/15 1412 Last data filed at 05/11/15 1300  Gross per  24 hour  Intake 1140.81 ml  Output   3500 ml  Net -2359.19 ml   Filed Weights   05/09/15 0500 05/10/15 0500 05/11/15 0442  Weight: 66.4 kg (146 lb 6.2 oz) 65.6 kg (144 lb 10 oz) 65 kg (143 lb 4.8 oz)    Exam:   General:  Awake, sitting in bed, eating,  in nad  Cardiovascular: regular, s1, s2  Respiratory: normal  resp effort, no wheezing  Abdomen: soft,nondistended, nontender  Musculoskeletal: perfused, no clubbing, no cyanosis  Data Reviewed: Basic Metabolic Panel:  Recent Labs Lab 05/05/15 0239  05/07/15 1206 05/08/15 0505 05/08/15 0815 05/09/15 0510 05/10/15 0021 05/11/15 0448  NA 132*  < > 135 136  --  133* 136 135  K 4.0  < > 4.5 4.0  --  4.2 4.5 4.5  CL 98*  < > 98* 98*  --  101 101 101  CO2 25  < > 30 29  --  26 28 27   GLUCOSE 114*  < > 81 82  --  88 96 87  BUN 21*  < > 20 17  --  15 16 15   CREATININE 1.29*  < > 1.15 0.96  --  0.94 1.10 0.97  CALCIUM 8.3*  < > 9.0 8.6*  --  8.3* 8.8* 8.8*  MG 2.0  --   --   --  2.0  --   --   --   < > = values in this interval not displayed. Liver Function Tests:  Recent Labs Lab 05/05/15 0239  AST 27  ALT 83*  ALKPHOS 76  BILITOT 1.1  PROT 5.5*  ALBUMIN 2.9*   No results for input(s): LIPASE, AMYLASE in the last 168 hours. No results for input(s): AMMONIA in the last 168 hours. CBC:  Recent Labs Lab 05/07/15 1206 05/08/15 0505 05/11/15 0449  WBC 7.8 6.4 5.1  HGB 16.2 16.2 15.8  HCT 49.1 48.1 47.5  MCV 93.2 91.8 91.9  PLT 197 159 149*   Cardiac Enzymes: No results for input(s): CKTOTAL, CKMB, CKMBINDEX, TROPONINI in the last 168 hours. BNP (last 3 results)  Recent Labs  05/01/15 0215  BNP 2662.2*    ProBNP (last 3 results) No results for input(s): PROBNP in the last 8760 hours.  CBG: No results for input(s): GLUCAP in the last 168 hours.  Recent Results (from the past 240 hour(s))  MRSA PCR Screening     Status: None   Collection Time: 05/09/15  9:20 AM  Result Value Ref Range Status   MRSA by PCR NEGATIVE NEGATIVE Final    Comment:        The GeneXpert MRSA Assay (FDA approved for NASAL specimens only), is one component of a comprehensive MRSA colonization surveillance program. It is not intended to diagnose MRSA infection nor to guide or monitor treatment for MRSA infections.      Studies: No  results found.  Scheduled Meds: . clonazePAM  1 mg Oral QHS  . clonazePAM  1 mg Oral Once  . coumadin book   Does not apply Once  . digoxin  0.25 mg Oral Daily  . gabapentin  300 mg Oral QHS  . magnesium oxide  200 mg Oral Daily  . mometasone-formoterol  2 puff Inhalation BID  . sodium chloride  10-40 mL Intracatheter Q12H  . spironolactone  12.5 mg Oral Daily  . warfarin  7.5 mg Oral ONCE-1800  . warfarin   Does not apply Once  . Warfarin - Pharmacist  Dosing Inpatient   Does not apply q1800   Continuous Infusions: . heparin 1,250 Units/hr (05/11/15 1308)    Principal Problem:   Acute combined systolic and diastolic congestive heart failure Active Problems:   Moderate persistent asthma in adult without complication   Cardiomegaly   Abnormal LFTs   PE (pulmonary embolism)   Severe mitral regurgitation   LV (left ventricular) mural thrombus   NSVT (nonsustained ventricular tachycardia)   Renal insufficiency   AKI (acute kidney injury)   SOB (shortness of breath)   Other specified hypotension   Acute deep vein thrombosis (DVT) of popliteal vein of right lower extremity   Acute respiratory failure with hypoxia   NICM (nonischemic cardiomyopathy)    Joeanna Howdyshell K  [facility] Pager [phone]. If 7PM-7AM, please contact night-coverage at [email or web address], password [facility] 05/11/2015, 2:12 PM  LOS: 11 days

## 2015-05-11 NOTE — Progress Notes (Addendum)
ANTICOAGULATION CONSULT NOTE - Follow Up Consult  Pharmacy Consult for Heparin and Warfarin Indication: pulmonary embolus and DVT  Allergies  Allergen Reactions  . Aspirin     REACTION: facial swelling  . Dust Mite Extract   . Other     Cats   . Peanuts [Peanut Oil]     sob   Patient Measurements: Height: 5' 8.5" (174 cm) Weight: 143 lb 4.8 oz (65 kg) IBW/kg (Calculated) : 69.55  Vital Signs: Temp: 97.6 F (36.4 C) (09/04 0357) Temp Source: Oral (09/04 0357) BP: 98/70 mmHg (09/04 0357) Pulse Rate: 94 (09/03 2318)  Labs:  Recent Labs  05/08/15 2225 05/09/15 0510 05/09/15 1000 05/09/15 1625 05/10/15 0021 05/10/15 0928 05/10/15 1827 05/11/15 0448 05/11/15 0449  HGB  --   --   --   --   --   --   --   --  15.8  HCT  --   --   --   --   --   --   --   --  47.5  PLT  --   --   --   --   --   --   --   --  149*  APTT 63*  --  87* 126*  --   --   --   --   --   LABPROT  --   --  16.9*  --  16.1*  --   --  15.1  --   INR  --   --  1.37  --  1.28  --   --  1.17  --   HEPARINUNFRC  --   --  0.70 0.88* 0.70 0.78* 0.49 0.35  --   CREATININE  --  0.94  --   --  1.10  --   --  0.97  --     Estimated Creatinine Clearance: 75.4 mL/min (by C-G formula based on Cr of 0.97).   Assessment: 58yom initially started on IV heparin for his new RLL PE (per CT abd/pelvis 8/24) and RLE DVT (per dopplers 8/25). He was transitioned to xarelto on 8/28. He has now been transitioned to Coumadin in case needs LVAD.   After missed doses, Coumadin started yesterday evening. INR remains SUBtherapeutic at 1.17. HL remains therapeutic but has trended down to 0.35. No interruptions in gtt and no obvious s/s bleeding per nurse but it was noted that rate was at 1200 u/hr instead of 1150 u/hr. CBC remains stable and wnl. Will need at least a 5 day overlap with heparin and coumadin for acute VTE.  Goal of Therapy:  Heparin level 0.3-0.7 units/ml Monitor platelets by anticoagulation protocol: Yes    Plan:  - Increase heparin to 1250 units/hr to maintain in goal range - Coumadin 7.5 mg PO x 1 tonight - Daily CBC/HL/INR - Monitor for bleeding  Erika K. Bonnye Fava, PharmD, BCPS, CPP Clinical Pharmacist Pager: [phone] Phone: [phone] 05/11/2015 7:44 AM   Addendum   HL is back and within therapeutic range. Cont heparin at 1250 units/hr.  Ulyses Southward, PharmD Pager: [phone] 05/11/2015 4:02 PM

## 2015-05-11 NOTE — Progress Notes (Signed)
Advanced Heart Failure Rounding Note   Subjective:     8/31 Cath lab for RHC/swan placement--> cardiogenic shock. Started on milrinone 0.25 mcg and diuresed with IV lasix. 9/1/ Milrinone 0.25 mcg. Started on sprio and losartan but was hypotensive. Given 250 cc NS. CO-OX 76%.     On milrinone 0.125. Off norepi. SBP 95-105. Co-ox 78%. On heparin/coumadin for DVT/PE. Off lasix. Feels stronger. Walked 2 laps around unit last night. Mild chest tightness after. No dyspnea. Weight stable. CVP 2. Cleda Daub not started yesterday. SBP 90-105.    RHC 8/31 RA = 9 RV = 39/5/12 PA = 41/20 (28) PCW = 18 Fick cardiac output/index = 2.6/1.4 Therms cardiac output/index = 3.6/2.0 PVR = 3.8 WU FA sat = 98% PA sat = 54%, 57%   8/25 ECHO EF 10% RV dilated  8/25 + Acute DVT distal to mild popliteal vein RLE No DVT on Left  8/26 LHC -  1. Prox RCA to Dist RCA lesion, 20% stenosed. 2. Ost Cx to Dist Cx lesion, 20% stenosed. 3. Mid LAD lesion, 25% stenosed.   FH: Father --> CHF due to ischemic CM  Objective:   Weight Range:  Vital Signs:   Temp:  [97.6 F (36.4 C)-98.1 F (36.7 C)] 97.6 F (36.4 C) (09/04 0823) Pulse Rate:  [93-94] 94 (09/03 2318) Resp:  [16-20] 16 (09/04 0823) BP: (95-109)/(62-77) 104/67 mmHg (09/04 0823) SpO2:  [95 %-96 %] 95 % (09/04 0823) Weight:  [65 kg (143 lb 4.8 oz)] 65 kg (143 lb 4.8 oz) (09/04 0442) Last BM Date: 05/03/15  Weight change: Filed Weights   05/09/15 0500 05/10/15 0500 05/11/15 0442  Weight: 66.4 kg (146 lb 6.2 oz) 65.6 kg (144 lb 10 oz) 65 kg (143 lb 4.8 oz)    Intake/Output:   Intake/Output Summary (Last 24 hours) at 05/11/15 1237 Last data filed at 05/11/15 0900  Gross per 24 hour  Intake 1304.81 ml  Output   3200 ml  Net -1895.19 ml     Physical Exam: General:  Lying flat in bed HEENT: normal  Neck: supple. No jvd Carotids 2+ bilat; no bruits. No lymphadenopathy or thryomegaly appreciated. Cor: PMI nondisplaced. Regular rate & rhythm.  No rubs, or murmurs. + s3  Lungs: clear Abdomen: soft, nontender, non- distended. No hepatosplenomegaly. No bruits or masses. Good bowel sounds. Extremities: no cyanosis, clubbing, rash,no edema. Warm Neuro: alert & orientedx3, cranial nerves grossly intact. moves all 4 extremities w/o difficulty. Affect pleasant  Telemetry: SR 80s   Labs: Basic Metabolic Panel:  Recent Labs Lab 05/05/15 0239  05/07/15 1206 05/08/15 0505 05/08/15 0815 05/09/15 0510 05/10/15 0021 05/11/15 0448  NA 132*  < > 135 136  --  133* 136 135  K 4.0  < > 4.5 4.0  --  4.2 4.5 4.5  CL 98*  < > 98* 98*  --  101 101 101  CO2 25  < > 30 29  --  GLUCOSE 114*  < > 81 82  --  88 96 87  BUN 21*  < > 20 17  --  CREATININE 1.29*  < > 1.15 0.96  --  0.94 1.10 0.97  CALCIUM 8.3*  < > 9.0 8.6*  --  8.3* 8.8* 8.8*  MG 2.0  --   --   --  2.0  --   --   --   < > = values in this interval not displayed.  Liver Function  Tests:  Recent Labs Lab 05/05/15 0239  AST 27  ALT 83*  ALKPHOS 76  BILITOT 1.1  PROT 5.5*  ALBUMIN 2.9*   No results for input(s): LIPASE, AMYLASE in the last 168 hours. No results for input(s): AMMONIA in the last 168 hours.  CBC:  Recent Labs Lab 05/07/15 1206 05/08/15 0505 05/11/15 0449  WBC 7.8 6.4 5.1  HGB 16.2 16.2 15.8  HCT 49.1 48.1 47.5  MCV 93.2 91.8 91.9  PLT 197 159 149*    Cardiac Enzymes: No results for input(s): CKTOTAL, CKMB, CKMBINDEX, TROPONINI in the last 168 hours.  BNP: BNP (last 3 results)  Recent Labs  05/01/15 0215  BNP 2662.2*    ProBNP (last 3 results) No results for input(s): PROBNP in the last 8760 hours.    Other results:  Imaging: No results found.   Medications:     Scheduled Medications: . clonazePAM  1 mg Oral QHS  . clonazePAM  1 mg Oral Once  . coumadin book   Does not apply Once  . digoxin  0.25 mg Oral Daily  . gabapentin  300 mg Oral QHS  . magnesium oxide  200 mg Oral Daily  .  mometasone-formoterol  2 puff Inhalation BID  . sodium chloride  10-40 mL Intracatheter Q12H  . spironolactone  12.5 mg Oral Daily  . warfarin  7.5 mg Oral ONCE-1800  . warfarin   Does not apply Once  . Warfarin - Pharmacist Dosing Inpatient   Does not apply q1800    Infusions: . heparin 1,250 Units/hr (05/11/15 0823)  . milrinone 0.125 mcg/kg/min (05/11/15 0800)    PRN Medications: acetaminophen, albuterol, ondansetron (ZOFRAN) IV, oxyCODONE-acetaminophen   Assessment:  1. A/C Biventricular HF 2. Acute DVT RLE 3. Acute PE- RLL noted on CT 4. Severe MR 5. NSVT 6. Cardiogenic Shock   Plan/Discussion:    Improving slowly. Now fully diuresed. Remains on low dose milrinone. Co-ox 78%. Will stop milrinone. Add low-dose spiro. No lasix for now. Consult cardiac Rehab. May try low dose losartan tomorrow as BP tolerates. No b-blocker yet. Continue digoxin.   He is VERY reluctant about considering home inotropes or VAD therapies.   Loading coumadin for DVT/PE.    Bensimhon, Daniel,MD 12:37 PM Advanced Heart Failure Team Pager [phone] (M-F; 7a - 4p)  Please contact [facility] for night-coverage after hours (4p -7a ) and weekends on [email or web address]

## 2015-05-12 LAB — BASIC METABOLIC PANEL WITH GFR
Anion gap: 8 (ref 5–15)
BUN: 14 mg/dL (ref 6–20)
CO2: 27 mmol/L (ref 22–32)
Calcium: 8.8 mg/dL — ABNORMAL LOW (ref 8.9–10.3)
Chloride: 101 mmol/L (ref 101–111)
Creatinine, Ser: 0.85 mg/dL (ref 0.61–1.24)
GFR calc Af Amer: >60 mL/min
GFR calc non Af Amer: >60 mL/min
Glucose, Bld: 80 mg/dL (ref 65–99)
Potassium: 4.5 mmol/L (ref 3.5–5.1)
Sodium: 136 mmol/L (ref 135–145)

## 2015-05-12 LAB — CARBOXYHEMOGLOBIN
Carboxyhemoglobin: 1.6 % — ABNORMAL HIGH (ref 0.5–1.5)
Carboxyhemoglobin: 1.2 % (ref 0.5–1.5)
Methemoglobin: 0.5 % (ref 0.0–1.5)
Methemoglobin: 0.7 % (ref 0.0–1.5)
O2 Saturation: 90.4 %
O2 Saturation: 66.9 %
Total hemoglobin: 16.3 g/dL (ref 13.5–18.0)
Total hemoglobin: 16.3 g/dL (ref 13.5–18.0)

## 2015-05-12 LAB — CBC
HCT: 48.4 % (ref 39.0–52.0)
Hemoglobin: 16 g/dL (ref 13.0–17.0)
MCH: 30.3 pg (ref 26.0–34.0)
MCHC: 33.1 g/dL (ref 30.0–36.0)
MCV: 91.7 fL (ref 78.0–100.0)
Platelets: 147 10*3/uL — ABNORMAL LOW (ref 150–400)
RBC: 5.28 MIL/uL (ref 4.22–5.81)
RDW: 15.1 % (ref 11.5–15.5)
WBC: 4.4 10*3/uL (ref 4.0–10.5)

## 2015-05-12 LAB — PROTIME-INR
INR: 1.29 (ref 0.00–1.49)
PROTHROMBIN TIME: 16.2 s — AB (ref 11.6–15.2)

## 2015-05-12 LAB — HEPARIN LEVEL (UNFRACTIONATED): HEPARIN UNFRACTIONATED: 0.58 [IU]/mL (ref 0.30–0.70)

## 2015-05-12 MED ORDER — DIPHENHYDRAMINE HCL 50 MG/ML IJ SOLN
25.0000 mg | Freq: Once | INTRAMUSCULAR | Status: AC
  Administered 2015-05-12: 25 mg via INTRAVENOUS
  Filled 2015-05-12: qty 1

## 2015-05-12 MED ORDER — LOSARTAN POTASSIUM 25 MG PO TABS
12.5000 mg | ORAL_TABLET | Freq: Every day | ORAL | Status: DC
  Administered 2015-05-12 – 2015-05-16 (×4): 12.5 mg via ORAL
  Filled 2015-05-12: qty 0.5
  Filled 2015-05-12 (×3): qty 1
  Filled 2015-05-12: qty 0.5
  Filled 2015-05-12: qty 1

## 2015-05-12 MED ORDER — WARFARIN SODIUM 10 MG PO TABS
10.0000 mg | ORAL_TABLET | Freq: Once | ORAL | Status: AC
  Administered 2015-05-12: 10 mg via ORAL
  Filled 2015-05-12: qty 1

## 2015-05-12 NOTE — Progress Notes (Signed)
[facility] PROGRESS NOTE  Marcus Bates ZOX:[id number] DOB: [birth year] DOA: 04/30/2015 PCP: Lolita Patella, MD  Assessment/Plan: Acute systolic and diastolic CHF with nonischemic cardiomyopathy -Cardiac cath revealed nonobstructive disease. Currently on Lasix 40 mg PO daily. Had been on low dose beta blocker and ramipril. Given low BP, have d/c'd ramipril. Cardiology has since d/c'd metoprolol as of 8/31.  -Pt with severe mitral regurgitation on echo. Cardiology and EP do not recommend TEE and role for valve repair at this time given severe regurgitation of unknown duration. -Cardiac MRI shows severe biventricular dilatation and impaired function and no LV thrombus seen. -Recommend to repeat a 2-D echo in 3 months to evaluate his heart function. - Patient underwent  RHC on 8/31 with findings of low output HF with cardiogenic shock physiology - Recently d/c'd beta blocker and acei given hypotension - Cont plan per Cardiology. Now off milrinone, on losartan. Remains off lasix. Plans for Cardiac Rehab  Increasing dyspnea during the night with? Apnea -Patient recently reported waking up during feeling suffocated and gasping for air. Sats noted to be normal. Patient had been followed by Dr. Maple Hudson for excessive sleepiness and had sleep study in 8/2015consistent with nonspecific hypersomnia and ruled out for sleep apnea. -ABG was unremarkable. Possibly related to orthopnea and PND. Keep head of bed elevated at 30 at all times. -No o2 desaturation at rest or ambulation. No nocturnal o2 desat. Sniff test negative for diaphragmatic muscle denervation  -Patient continuing to feel better  Severe MR -Possibly secondary to cardiomyopathy.  -No role for TEE and valve replacement at this time as it will not improve his heart function.  Acute DVT with right sided PE -Was started on IV heparin and switched to oral Xarelto on 8/28, changed to coumadin on 9/1 given social issues -Plan is for  at least 6 mos, if not longer, anticoagulation given his on prolonged venous thromboembolic is in an severe cardiomyopathy and severe MR.  Severe MR -Possibly Secondary to cardiomyopathy. No role for TEE and valve replacement at this time as it will not improve his heart function.   Nonsustained V. tach -Evaluated by EP. Plan to repeat echo in 3 months for consideration of ICD. No indication for antiarrhythmics or LifeVest at this time. -Has multiple PVCs on the monitor. Asymptomatic. Unable to increase beta blocker dose due to hypotension. Potassium and Magnesium normal.   Hypotension -Possibly associated with severe CHF. Patient is asymptomatic. D/c'd beta blocker and ACEI. Pt remains on digoxin and lasix -Stable   Transaminitis -Possibly hepatic ischemia with CHF. Liver and gallbladder on admission CT unremarkable. LFTs improving.   DVT prophylaxis: Had been on Xarelto, transition to coumadin  Code Status: Full Family Communication: Pt in room Disposition Plan: Pending   Consultants:  Cardiology  Critical Care  Procedures:  Cath 8/26  RHC 8/31  Antibiotics:   (indicate start date, and stop date if known)  HPI/Subjective: No complaints today. Eager to be discharged  Objective: Filed Vitals:   05/12/15 0451 05/12/15 0700 05/12/15 0832 05/12/15 1229  BP: 102/68  97/69 94/67  Pulse:   80 93  Temp: 97.6 F (36.4 C)  97.7 F (36.5 C) 98.3 F (36.8 C)  TempSrc: Oral  Oral Oral  Resp: Height:      Weight:  64.6 kg (142 lb 6.7 oz)    SpO2: 94%  96% 96%    Intake/Output Summary (Last 24 hours) at 05/12/15 1449 Last data filed at 05/12/15 1400  Gross per 24 hour  Intake    660 ml  Output   2375 ml  Net  -1715 ml   Filed Weights   05/10/15 0500 05/11/15 0442 05/12/15 0700  Weight: 65.6 kg (144 lb 10 oz) 65 kg (143 lb 4.8 oz) 64.6 kg (142 lb 6.7 oz)    Exam:   General:  Awake, sitting in bed, eating,  in nad  Cardiovascular: regular,  s1, s2  Respiratory: normal resp effort, no wheezing  Abdomen: soft,nondistended, nontender  Musculoskeletal: perfused, no clubbing, no cyanosis  Data Reviewed: Basic Metabolic Panel:  Recent Labs Lab 05/08/15 0505 05/08/15 0815 05/09/15 0510 05/10/15 0021 05/11/15 0448 05/12/15 0505  NA 136  --  133* 136 135 136  K 4.0  --  4.2 4.5 4.5 4.5  CL 98*  --  101 101 101 101  CO2 29  --  26 28 27 27   GLUCOSE 82  --  88 96 87 80  BUN 17  --  15 16 15 14   CREATININE 0.96  --  0.94 1.10 0.97 0.85  CALCIUM 8.6*  --  8.3* 8.8* 8.8* 8.8*  MG  --  2.0  --   --   --   --    Liver Function Tests: No results for input(s): AST, ALT, ALKPHOS, BILITOT, PROT, ALBUMIN in the last 168 hours. No results for input(s): LIPASE, AMYLASE in the last 168 hours. No results for input(s): AMMONIA in the last 168 hours. CBC:  Recent Labs Lab 05/07/15 1206 05/08/15 0505 05/11/15 0449 05/12/15 0505  WBC 7.8 6.4 5.1 4.4  HGB 16.2 16.2 15.8 16.0  HCT 49.1 48.1 47.5 48.4  MCV 93.2 91.8 91.9 91.7  PLT 197 159 149* 147*   Cardiac Enzymes: No results for input(s): CKTOTAL, CKMB, CKMBINDEX, TROPONINI in the last 168 hours. BNP (last 3 results)  Recent Labs  05/01/15 0215  BNP 2662.2*    ProBNP (last 3 results) No results for input(s): PROBNP in the last 8760 hours.  CBG: No results for input(s): GLUCAP in the last 168 hours.  Recent Results (from the past 240 hour(s))  MRSA PCR Screening     Status: None   Collection Time: 05/09/15  9:20 AM  Result Value Ref Range Status   MRSA by PCR NEGATIVE NEGATIVE Final    Comment:        The GeneXpert MRSA Assay (FDA approved for NASAL specimens only), is one component of a comprehensive MRSA colonization surveillance program. It is not intended to diagnose MRSA infection nor to guide or monitor treatment for MRSA infections.      Studies: No results found.  Scheduled Meds: . clonazePAM  1 mg Oral QHS  . clonazePAM  1 mg Oral Once   . coumadin book   Does not apply Once  . digoxin  0.25 mg Oral Daily  . gabapentin  300 mg Oral QHS  . losartan  12.5 mg Oral Daily  . magnesium oxide  200 mg Oral Daily  . mometasone-formoterol  2 puff Inhalation BID  . sodium chloride  10-40 mL Intracatheter Q12H  . warfarin  10 mg Oral ONCE-1800  . warfarin   Does not apply Once  . Warfarin - Pharmacist Dosing Inpatient   Does not apply q1800   Continuous Infusions: . heparin 1,250 Units/hr (05/12/15 0800)    Principal Problem:   Acute combined systolic and diastolic congestive heart failure Active Problems:   Moderate persistent asthma in adult without complication  Cardiomegaly   Abnormal LFTs   PE (pulmonary embolism)   Severe mitral regurgitation   LV (left ventricular) mural thrombus   NSVT (nonsustained ventricular tachycardia)   Renal insufficiency   AKI (acute kidney injury)   SOB (shortness of breath)   Other specified hypotension   Acute deep vein thrombosis (DVT) of popliteal vein of right lower extremity   Acute respiratory failure with hypoxia   NICM (nonischemic cardiomyopathy)    Charlesa Ehle K  [facility] Pager [phone]. If 7PM-7AM, please contact night-coverage at [email or web address], password [facility] 05/12/2015, 2:49 PM  LOS: 12 days

## 2015-05-12 NOTE — Progress Notes (Addendum)
Advanced Heart Failure Rounding Note   Subjective:     8/31 Cath lab for RHC/swan placement--> cardiogenic shock. Started on milrinone 0.25 mcg and diuresed with IV lasix. 9/1/ Milrinone 0.25 mcg. Started on sprio and losartan but was hypotensive. Given 250 cc NS. CO-OX 76%.     Milrinone stopped yesterday. Co-ox 90% (?). On heparin/coumadin for DVT/PE. INR 1.3. Off lasix. Feels stronger. Walking hall. No dyspnea. Weight stable. CVP 3. SBPs 90s. Cleda Daub started yesterday and now with diffuse maculopapular rash.   RHC 8/31 RA = 9 RV = 39/5/12 PA = 41/20 (28) PCW = 18 Fick cardiac output/index = 2.6/1.4 Therms cardiac output/index = 3.6/2.0 PVR = 3.8 WU FA sat = 98% PA sat = 54%, 57%   8/25 ECHO EF 10% RV dilated  8/25 + Acute DVT distal to mild popliteal vein RLE No DVT on Left  8/26 LHC -  1. Prox RCA to Dist RCA lesion, 20% stenosed. 2. Ost Cx to Dist Cx lesion, 20% stenosed. 3. Mid LAD lesion, 25% stenosed.   FH: Father --> CHF due to ischemic CM  Objective:   Weight Range:  Vital Signs:   Temp:  [97.6 F (36.4 C)-97.9 F (36.6 C)] 97.7 F (36.5 C) (09/05 0832) Pulse Rate:  [80-87] 80 (09/05 0832) Resp:  [15-18] 15 (09/05 0832) BP: (92-102)/(62-71) 97/69 mmHg (09/05 0832) SpO2:  [94 %-96 %] 96 % (09/05 0832) Weight:  [64.6 kg (142 lb 6.7 oz)] 64.6 kg (142 lb 6.7 oz) (09/05 0700) Last BM Date: 05/11/15  Weight change: Filed Weights   05/10/15 0500 05/11/15 0442 05/12/15 0700  Weight: 65.6 kg (144 lb 10 oz) 65 kg (143 lb 4.8 oz) 64.6 kg (142 lb 6.7 oz)    Intake/Output:   Intake/Output Summary (Last 24 hours) at 05/12/15 1035 Last data filed at 05/12/15 0900  Gross per 24 hour  Intake  617.5 ml  Output   2675 ml  Net -2057.5 ml     Physical Exam: General:  Sitting in chair  HEENT: normal  Neck: supple. No jvd Carotids 2+ bilat; no bruits. No lymphadenopathy or thryomegaly appreciated. Cor: PMI nondisplaced. Regular rate & rhythm. No rubs, or murmurs.  + s3  Lungs: clear Abdomen: soft, nontender, non- distended. No hepatosplenomegaly. No bruits or masses. Good bowel sounds. Extremities: no cyanosis, clubbing, rash,no edema. Warm Neuro: alert & orientedx3, cranial nerves grossly intact. moves all 4 extremities w/o difficulty. Affect pleasant Skin: diffuse maculopapular rash on trunk and arms.   Telemetry: SR 80s   Labs: Basic Metabolic Panel:  Recent Labs Lab 05/08/15 0505 05/08/15 0815 05/09/15 0510 05/10/15 0021 05/11/15 0448 05/12/15 0505  NA 136  --  133* 136 135 136  K 4.0  --  4.2 4.5 4.5 4.5  CL 98*  --  101 101 101 101  CO2 29  --  26 28 27 27   GLUCOSE 82  --  88 96 87 80  BUN 17  --  15 16 15 14   CREATININE 0.96  --  0.94 1.10 0.97 0.85  CALCIUM 8.6*  --  8.3* 8.8* 8.8* 8.8*  MG  --  2.0  --   --   --   --     Liver Function Tests: No results for input(s): AST, ALT, ALKPHOS, BILITOT, PROT, ALBUMIN in the last 168 hours. No results for input(s): LIPASE, AMYLASE in the last 168 hours. No results for input(s): AMMONIA in the last 168 hours.  CBC:  Recent Labs Lab  05/07/15 1206 05/08/15 0505 05/11/15 0449 05/12/15 0505  WBC 7.8 6.4 5.1 4.4  HGB 16.2 16.2 15.8 16.0  HCT 49.1 48.1 47.5 48.4  MCV 93.2 91.8 91.9 91.7  PLT 197 159 149* 147*    Cardiac Enzymes: No results for input(s): CKTOTAL, CKMB, CKMBINDEX, TROPONINI in the last 168 hours.  BNP: BNP (last 3 results)  Recent Labs  05/01/15 0215  BNP 2662.2*    ProBNP (last 3 results) No results for input(s): PROBNP in the last 8760 hours.    Other results:  Imaging: No results found.   Medications:     Scheduled Medications: . clonazePAM  1 mg Oral QHS  . clonazePAM  1 mg Oral Once  . coumadin book   Does not apply Once  . digoxin  0.25 mg Oral Daily  . gabapentin  300 mg Oral QHS  . magnesium oxide  200 mg Oral Daily  . mometasone-formoterol  2 puff Inhalation BID  . sodium chloride  10-40 mL Intracatheter Q12H  .  spironolactone  12.5 mg Oral Daily  . warfarin  10 mg Oral ONCE-1800  . warfarin   Does not apply Once  . Warfarin - Pharmacist Dosing Inpatient   Does not apply q1800    Infusions: . heparin 1,250 Units/hr (05/12/15 0119)    PRN Medications: acetaminophen, albuterol, ondansetron (ZOFRAN) IV, oxyCODONE-acetaminophen   Assessment:  1. A/C Biventricular HF 2. Acute DVT RLE 3. Acute PE- RLL noted on CT 4. Severe MR 5. NSVT 6. Cardiogenic Shock   Plan/Discussion:    Improving slowly. Now fully diuresed. Now off milrinone.  Co-ox inaccurate. Will repeat.No lasix for now. Consult cardiac Rehab.   Stop spiro due to allergy. Will try low dose losartan tonight as BP tolerates. If not use hydralazine. No b-blocker yet. Continue digoxin.   He is VERY reluctant about considering home inotropes or VAD therapies.   Loading coumadin for DVT/PE.    Tilmon Wisehart,MD 10:35 AM Advanced Heart Failure Team Pager [phone] (M-F; 7a - 4p)  Please contact [facility] for night-coverage after hours (4p -7a ) and weekends on [email or web address]

## 2015-05-12 NOTE — Progress Notes (Signed)
ANTICOAGULATION CONSULT NOTE - Follow Up Consult  Pharmacy Consult for Heparin and Warfarin Indication: pulmonary embolus and DVT  Allergies  Allergen Reactions  . Aspirin     REACTION: facial swelling  . Dust Mite Extract   . Other     Cats   . Peanuts [Peanut Oil]     sob   Patient Measurements: Height: 5' 8.5" (174 cm) Weight: 142 lb 6.7 oz (64.6 kg) IBW/kg (Calculated) : 69.55  Vital Signs: Temp: 97.7 F (36.5 C) (09/05 0832) Temp Source: Oral (09/05 0832) BP: 97/69 mmHg (09/05 0832) Pulse Rate: 80 (09/05 0832)  Labs:  Recent Labs  05/09/15 1000 05/09/15 1625 05/10/15 0021  05/11/15 0448 05/11/15 0449 05/11/15 1530 05/12/15 0505  HGB  --   --   --   --   --  15.8  --  16.0  HCT  --   --   --   --   --  47.5  --  48.4  PLT  --   --   --   --   --  149*  --  147*  APTT 87* 126*  --   --   --   --   --   --   LABPROT 16.9*  --  16.1*  --  15.1  --   --  16.2*  INR 1.37  --  1.28  --  1.17  --   --  1.29  HEPARINUNFRC 0.70 0.88* 0.70  < > 0.35  --  0.50 0.58  CREATININE  --   --  1.10  --  0.97  --   --  0.85  < > = values in this interval not displayed.  Estimated Creatinine Clearance: 85.5 mL/min (by C-G formula based on Cr of 0.85).   Assessment: 58yom initially started on IV heparin for his new RLL PE (per CT abd/pelvis 8/24) and RLE DVT (per dopplers 8/25). He was transitioned to xarelto on 8/28. He has now been transitioned to Coumadin in case needs LVAD.  -Heparin level is at goal (HL= 0.58) and INR is 1.29 on day 3 of heparin/coumadin overlap (goal of at least 5 days): . Hg/hct stable, plt= 147.   Goal of Therapy:  Heparin level 0.3-0.7 units/ml Monitor platelets by anticoagulation protocol: Yes   Plan:  -No heparin changes needed -Coumadin 10mg  po tonight -Heparin level and CBC daily  Harland German, Pharm D 05/12/2015 9:42 AM

## 2015-05-13 LAB — CBC
HEMATOCRIT: 49.2 % (ref 39.0–52.0)
HEMOGLOBIN: 16.6 g/dL (ref 13.0–17.0)
MCH: 31.1 pg (ref 26.0–34.0)
MCHC: 33.7 g/dL (ref 30.0–36.0)
MCV: 92.3 fL (ref 78.0–100.0)
Platelets: 137 10*3/uL — ABNORMAL LOW (ref 150–400)
RBC: 5.33 MIL/uL (ref 4.22–5.81)
RDW: 15.1 % (ref 11.5–15.5)
WBC: 4.7 10*3/uL (ref 4.0–10.5)

## 2015-05-13 LAB — BASIC METABOLIC PANEL
ANION GAP: 7 (ref 5–15)
BUN: 14 mg/dL (ref 6–20)
CHLORIDE: 103 mmol/L (ref 101–111)
CO2: 28 mmol/L (ref 22–32)
CREATININE: 0.9 mg/dL (ref 0.61–1.24)
Calcium: 9.2 mg/dL (ref 8.9–10.3)
GFR calc non Af Amer: >60 mL/min (ref >60)
Glucose, Bld: 80 mg/dL (ref 65–99)
Potassium: 4.4 mmol/L (ref 3.5–5.1)
SODIUM: 138 mmol/L (ref 135–145)

## 2015-05-13 LAB — HEPARIN LEVEL (UNFRACTIONATED): HEPARIN UNFRACTIONATED: 0.71 [IU]/mL — AB (ref 0.30–0.70)

## 2015-05-13 LAB — PROTIME-INR
INR: 1.37 (ref 0.00–1.49)
PROTHROMBIN TIME: 17 s — AB (ref 11.6–15.2)

## 2015-05-13 LAB — CARBOXYHEMOGLOBIN
Carboxyhemoglobin: 1.2 % (ref 0.5–1.5)
Methemoglobin: 0.5 % (ref 0.0–1.5)
O2 SAT: 72.9 %
Total hemoglobin: 16.8 g/dL (ref 13.5–18.0)

## 2015-05-13 MED ORDER — FAMOTIDINE 20 MG PO TABS
20.0000 mg | ORAL_TABLET | Freq: Two times a day (BID) | ORAL | Status: AC
  Administered 2015-05-13 (×2): 20 mg via ORAL
  Filled 2015-05-13 (×2): qty 1

## 2015-05-13 MED ORDER — WARFARIN SODIUM 7.5 MG PO TABS
7.5000 mg | ORAL_TABLET | Freq: Once | ORAL | Status: AC
  Administered 2015-05-13: 7.5 mg via ORAL
  Filled 2015-05-13: qty 1

## 2015-05-13 MED ORDER — WARFARIN SODIUM 2.5 MG PO TABS
2.5000 mg | ORAL_TABLET | Freq: Once | ORAL | Status: AC
  Administered 2015-05-13: 2.5 mg via ORAL
  Filled 2015-05-13: qty 1

## 2015-05-13 MED ORDER — PREDNISONE 20 MG PO TABS
40.0000 mg | ORAL_TABLET | Freq: Once | ORAL | Status: AC
  Administered 2015-05-13: 40 mg via ORAL
  Filled 2015-05-13: qty 2

## 2015-05-13 MED ORDER — WARFARIN SODIUM 10 MG PO TABS: 10.0000 mg | ORAL_TABLET | Freq: Once | ORAL | Status: DC

## 2015-05-13 NOTE — Progress Notes (Signed)
ANTICOAGULATION CONSULT NOTE - Follow Up Consult  Pharmacy Consult for Heparin and Warfarin Indication: pulmonary embolus and DVT  Allergies  Allergen Reactions  . Spironolactone Rash  . Aspirin     REACTION: facial swelling  . Dust Mite Extract   . Other     Cats   . Peanuts [Peanut Oil]     sob   Patient Measurements: Height: 5' 8.5" (174 cm) Weight: 141 lb 11.2 oz (64.275 kg) IBW/kg (Calculated) : 69.55  Vital Signs: Temp: 97.3 F (36.3 C) (09/06 0833) Temp Source: Oral (09/06 0833) BP: 93/63 mmHg (09/06 0833)  Labs:  Recent Labs  05/11/15 0448  05/11/15 0449 05/11/15 1530 05/12/15 0505 05/13/15 0445  HGB  --   < > 15.8  --  16.0 16.6  HCT  --   --  47.5  --  48.4 49.2  PLT  --   --  149*  --  147* 137*  LABPROT 15.1  --   --   --  16.2* 17.0*  INR 1.17  --   --   --  1.29 1.37  HEPARINUNFRC 0.35  --   --  0.50 0.58 0.71*  CREATININE 0.97  --   --   --  0.85 0.90  < > = values in this interval not displayed.  Estimated Creatinine Clearance: 80.4 mL/min (by C-G formula based on Cr of 0.9).   Assessment: 58yom initially started on IV heparin for his new RLL PE (per CT abd/pelvis 8/24) and RLE DVT (per dopplers 8/25). He was transitioned to xarelto on 8/28. He has now been transitioned to Coumadin in case needs LVAD.  -Heparin level is > goal todsay 0.71 - will decrease dose slightly INR is 1.37 after 7.5/7.5/10 on day 3 of heparin/coumadin overlap (goal of at least 5 days): . Hg/hct stable, plt= 147.   Goal of Therapy:  Heparin level 0.3-0.7 units/ml Monitor platelets by anticoagulation protocol: Yes   Plan:  -decrease  heparin 1100 uts/hr -repeat Coumadin 10mg  po tonight - split dose 7.5&2.5 - pt has rash ? warf yellow dye from 7.5mg  tab or spiro - most likely spiro -Heparin level and CBC daily  Leota Sauers Pharm.D. CPP, BCPS Clinical Pharmacist [phone] 05/13/2015 11:25 AM

## 2015-05-13 NOTE — Progress Notes (Signed)
Spoke w pt. He lives alone. His parents are here from [place] and will stay til he gets home and settled in. Gave him hhc agency list and explained about intermittent hhrn and hhpt if md orders. Gave pt sitter priv duty list in case he wanted to hire help after disch. Pt most concerned about meals since he will need to watch sodium intake. Will cont to follow.

## 2015-05-13 NOTE — Progress Notes (Addendum)
[facility] PROGRESS NOTE  Zeppelin Bean IRS:[id number] DOB: 12-26-1955 DOA: 04/30/2015 PCP: Lolita Patella, MD   Brief narrative 59 year old male was admitted to [facility] on 8/25 with elevated LFTs and symptoms of increased dyspnea with exertion for past 1 month along with lower extremity swelling. On admission he had a CT of his abdomen and pelvis which showed anasarca along with cardiomegaly and incidental PE in this segmental right lower lobe pulmonary artery. 2-D echo done showed a depressed EF of 10-15% with grade 3 diastolic dysfunction. Patient was transferred to [facility] for cardiac cath which showed nonobstructive disease.  Patient has since been continued on aggressive diuresis. Pt underwent RHC on 8/31. Pt continued on milnirnone which was ultimately weaned off. The patient also started coumadin per pharmacy dosing with heparin gtt. Pt now fully diuresed and is awaiting INR to reach therapeutic range of 2-3.  Assessment/Plan: Acute systolic and diastolic CHF with nonischemic cardiomyopathy -Cardiac cath revealed nonobstructive disease. Currently on Lasix 40 mg PO daily. Had been on low dose beta blocker and ramipril. Given low BP, have d/c'd ramipril. Cardiology has since d/c'd metoprolol as of 8/31.  -Pt with severe mitral regurgitation on echo. Cardiology and EP do not recommend TEE and role for valve repair at this time given severe regurgitation of unknown duration. -Cardiac MRI shows severe biventricular dilatation and impaired function and no LV thrombus seen. -Recommend to repeat a 2-D echo in 3 months to evaluate his heart function. - Patient underwent  RHC on 8/31 with findings of low output HF with cardiogenic shock physiology - Recently d/c'd beta blocker and acei given hypotension - Cont plan per Cardiology. Now off milrinone, on losartan. Remains off lasix. Plans for Cardiac Rehab - Pt did not tolerate spironolactone given concerns of possible  drug rxn. On empiric steroids  Increasing dyspnea during the night with? Apnea -Patient recently reported waking up during feeling suffocated and gasping for air. Sats noted to be normal. Patient had been followed by Dr. Maple Hudson for excessive sleepiness and had sleep study in 8/2015consistent with nonspecific hypersomnia and ruled out for sleep apnea. -ABG was unremarkable. Possibly related to orthopnea and PND. Keep head of bed elevated at 30 at all times. -No o2 desaturation at rest or ambulation. No nocturnal o2 desat. Sniff test negative for diaphragmatic muscle denervation  -Patient continuing to feel better  Severe MR -Possibly secondary to cardiomyopathy.  -No role for TEE and valve replacement at this time as it will not improve his heart function.  Acute DVT with right sided PE -Was started on IV heparin and switched to oral Xarelto on 8/28, changed to coumadin on 9/1 given social issues -Plan is for at least 6 mos, if not longer, anticoagulation given his on prolonged venous thromboembolic is in an severe cardiomyopathy and severe MR.  Severe MR -Possibly Secondary to cardiomyopathy. No role for TEE and valve replacement at this time as it will not improve his heart function.   Nonsustained V. tach -Evaluated by EP. Plan to repeat echo in 3 months for consideration of ICD. No indication for antiarrhythmics or LifeVest at this time. -Has multiple PVCs on the monitor. Asymptomatic. Unable to increase beta blocker dose due to hypotension. Potassium and Magnesium normal.   Hypotension -Possibly associated with severe CHF. Patient is asymptomatic. D/c'd beta blocker and ACEI. Pt remains on digoxin and lasix -Stable   Transaminitis -Possibly hepatic ischemia with CHF. Liver and gallbladder on admission CT unremarkable. LFTs improving.   DVT  prophylaxis: Had been on Xarelto, transition to coumadin  Code Status: Full Family Communication: Pt in room Disposition Plan:  Pending   Consultants:  Cardiology  Critical Care  Procedures:  Cath 8/26  RHC 8/31  Antibiotics:   (indicate start date, and stop date if known)  HPI/Subjective: Patient is asking about going home. No sob  Objective: Filed Vitals:   05/13/15 0000 05/13/15 0449 05/13/15 0833 05/13/15 1100  BP: 91/57 91/61 93/63  102/67  Pulse:      Temp: 98 F (36.7 C) 97.6 F (36.4 C) 97.3 F (36.3 C) 97.8 F (36.6 C)  TempSrc: Oral Oral Oral Oral  Resp:  18 17   Height:      Weight:  64.275 kg (141 lb 11.2 oz)    SpO2: 97% 96% 98%     Intake/Output Summary (Last 24 hours) at 05/13/15 1522 Last data filed at 05/13/15 0800  Gross per 24 hour  Intake  212.5 ml  Output   1550 ml  Net -1337.5 ml   Filed Weights   05/11/15 0442 05/12/15 0700 05/13/15 0449  Weight: 65 kg (143 lb 4.8 oz) 64.6 kg (142 lb 6.7 oz) 64.275 kg (141 lb 11.2 oz)    Exam:   General:  Awake, laying in bed, eating,  in nad  Cardiovascular: regular, s1, s2  Respiratory: normal resp effort, no wheezing  Abdomen: soft,nondistended, nontender  Musculoskeletal: perfused, no cyanosis  Data Reviewed: Basic Metabolic Panel:  Recent Labs Lab 05/08/15 0815 05/09/15 0510 05/10/15 0021 05/11/15 0448 05/12/15 0505 05/13/15 0445  NA  --  133* 136 135 136 138  K  --  4.2 4.5 4.5 4.5 4.4  CL  --  101 101 101 101 103  CO2  --  26 28 27 27 28   GLUCOSE  --  88 96 87 80 80  BUN  --  15 16 15 14 14   CREATININE  --  0.94 1.10 0.97 0.85 0.90  CALCIUM  --  8.3* 8.8* 8.8* 8.8* 9.2  MG 2.0  --   --   --   --   --    Liver Function Tests: No results for input(s): AST, ALT, ALKPHOS, BILITOT, PROT, ALBUMIN in the last 168 hours. No results for input(s): LIPASE, AMYLASE in the last 168 hours. No results for input(s): AMMONIA in the last 168 hours. CBC:  Recent Labs Lab 05/07/15 1206 05/08/15 0505 05/11/15 0449 05/12/15 0505 05/13/15 0445  WBC 7.8 6.4 5.1 4.4 4.7  HGB 16.2 16.2 15.8 16.0 16.6  HCT  49.1 48.1 47.5 48.4 49.2  MCV 93.2 91.8 91.9 91.7 92.3  PLT 197 159 149* 147* 137*   Cardiac Enzymes: No results for input(s): CKTOTAL, CKMB, CKMBINDEX, TROPONINI in the last 168 hours. BNP (last 3 results)  Recent Labs  05/01/15 0215  BNP 2662.2*    ProBNP (last 3 results) No results for input(s): PROBNP in the last 8760 hours.  CBG: No results for input(s): GLUCAP in the last 168 hours.  Recent Results (from the past 240 hour(s))  MRSA PCR Screening     Status: None   Collection Time: 05/09/15  9:20 AM  Result Value Ref Range Status   MRSA by PCR NEGATIVE NEGATIVE Final    Comment:        The GeneXpert MRSA Assay (FDA approved for NASAL specimens only), is one component of a comprehensive MRSA colonization surveillance program. It is not intended to diagnose MRSA infection nor to guide or monitor treatment  for MRSA infections.      Studies: No results found.  Scheduled Meds: . clonazePAM  1 mg Oral QHS  . clonazePAM  1 mg Oral Once  . coumadin book   Does not apply Once  . digoxin  0.25 mg Oral Daily  . famotidine  20 mg Oral BID  . gabapentin  300 mg Oral QHS  . losartan  12.5 mg Oral Daily  . magnesium oxide  200 mg Oral Daily  . mometasone-formoterol  2 puff Inhalation BID  . sodium chloride  10-40 mL Intracatheter Q12H  . warfarin  2.5 mg Oral ONCE-1800   And  . warfarin  7.5 mg Oral ONCE-1800  . warfarin   Does not apply Once  . Warfarin - Pharmacist Dosing Inpatient   Does not apply q1800   Continuous Infusions: . heparin 1,100 Units/hr (05/13/15 1046)    Principal Problem:   Acute combined systolic and diastolic congestive heart failure Active Problems:   Moderate persistent asthma in adult without complication   Cardiomegaly   Abnormal LFTs   PE (pulmonary embolism)   Severe mitral regurgitation   LV (left ventricular) mural thrombus   NSVT (nonsustained ventricular tachycardia)   Renal insufficiency   AKI (acute kidney injury)   SOB  (shortness of breath)   Other specified hypotension   Acute deep vein thrombosis (DVT) of popliteal vein of right lower extremity   Acute respiratory failure with hypoxia   NICM (nonischemic cardiomyopathy)    Meghan Warshawsky K  [facility] Pager [phone]. If 7PM-7AM, please contact night-coverage at [email or web address], password [facility] 05/13/2015, 3:22 PM  LOS: 13 days

## 2015-05-13 NOTE — Progress Notes (Signed)
Advanced Heart Failure Rounding Note   Subjective:     8/31 Cath lab for RHC/swan placement--> cardiogenic shock. Started on milrinone 0.25 mcg and diuresed with IV lasix. 9/1/ Milrinone 0.25 mcg. Started on sprio and losartan but was hypotensive. Given 250 cc NS. CO-OX 76%.   9/4 off milrinone .  9/5 CO-OX 67% spiro stopped  Denies SOB/Orthopnea. Co-ox 73%. Rash worse today. CVP 3-4  RHC 8/31 RA = 9 RV = 39/5/12 PA = 41/20 (28) PCW = 18 Fick cardiac output/index = 2.6/1.4 Therms cardiac output/index = 3.6/2.0 PVR = 3.8 WU FA sat = 98% PA sat = 54%, 57%   8/25 ECHO EF 10% RV dilated  8/25 + Acute DVT distal to mild popliteal vein RLE No DVT on Left  8/26 LHC -  1. Prox RCA to Dist RCA lesion, 20% stenosed. 2. Ost Cx to Dist Cx lesion, 20% stenosed. 3. Mid LAD lesion, 25% stenosed.   FH: Father --> CHF due to ischemic CM  Objective:   Weight Range:  Vital Signs:   Temp:  [97.5 F (36.4 C)-98.3 F (36.8 C)] 97.6 F (36.4 C) (09/06 0449) Pulse Rate:  [80-93] 93 (09/05 1229) Resp:  [15-18] 18 (09/06 0449) BP: (88-97)/(55-69) 91/61 mmHg (09/06 0449) SpO2:  [95 %-97 %] 96 % (09/06 0449) Weight:  [141 lb 11.2 oz (64.275 kg)] 141 lb 11.2 oz (64.275 kg) (09/06 0449) Last BM Date: 05/12/15  Weight change: Filed Weights   05/11/15 0442 05/12/15 0700 05/13/15 0449  Weight: 143 lb 4.8 oz (65 kg) 142 lb 6.7 oz (64.6 kg) 141 lb 11.2 oz (64.275 kg)    Intake/Output:   Intake/Output Summary (Last 24 hours) at 05/13/15 0745 Last data filed at 05/13/15 0300  Gross per 24 hour  Intake    465 ml  Output   1850 ml  Net  -1385 ml     Physical Exam: General: Sitting up in bed. NAD  HEENT: normal  Neck: supple. No jvd.  Carotids 2+ bilat; no bruits. No lymphadenopathy or thryomegaly appreciated. Cor: PMI nondisplaced. Regular rate & rhythm. No rubs, or murmurs. + s3  Lungs: clear Abdomen: soft, nontender, non- distended. No hepatosplenomegaly. No bruits or masses. Good  bowel sounds. Extremities: no cyanosis, clubbing, rash,no edema. Warm Neuro: alert & orientedx3, cranial nerves grossly intact. moves all 4 extremities w/o difficulty. Affect pleasant Skin: diffuse maculopapular rash on trunk and arms. Worse then yesterday  Telemetry: SR 80s   Labs: Basic Metabolic Panel:  Recent Labs Lab 05/08/15 0815 05/09/15 0510 05/10/15 0021 05/11/15 0448 05/12/15 0505 05/13/15 0445  NA  --  133* 136 135 136 138  K  --  4.2 4.5 4.5 4.5 4.4  CL  --  101 101 101 101 103  CO2  --  26 28 27 27 28   GLUCOSE  --  88 96 87 80 80  BUN  --  15 16 15 14 14   CREATININE  --  0.94 1.10 0.97 0.85 0.90  CALCIUM  --  8.3* 8.8* 8.8* 8.8* 9.2  MG 2.0  --   --   --   --   --     Liver Function Tests: No results for input(s): AST, ALT, ALKPHOS, BILITOT, PROT, ALBUMIN in the last 168 hours. No results for input(s): LIPASE, AMYLASE in the last 168 hours. No results for input(s): AMMONIA in the last 168 hours.  CBC:  Recent Labs Lab 05/07/15 1206 05/08/15 0505 05/11/15 0449 05/12/15 0505 05/13/15 6837  WBC 7.8 6.4 5.1 4.4 4.7  HGB 16.2 16.2 15.8 16.0 16.6  HCT 49.1 48.1 47.5 48.4 49.2  MCV 93.2 91.8 91.9 91.7 92.3  PLT 197 159 149* 147* 137*    Cardiac Enzymes: No results for input(s): CKTOTAL, CKMB, CKMBINDEX, TROPONINI in the last 168 hours.  BNP: BNP (last 3 results)  Recent Labs  05/01/15 0215  BNP 2662.2*    ProBNP (last 3 results) No results for input(s): PROBNP in the last 8760 hours.    Other results:  Imaging: No results found.   Medications:     Scheduled Medications: . clonazePAM  1 mg Oral QHS  . clonazePAM  1 mg Oral Once  . coumadin book   Does not apply Once  . digoxin  0.25 mg Oral Daily  . gabapentin  300 mg Oral QHS  . losartan  12.5 mg Oral Daily  . magnesium oxide  200 mg Oral Daily  . mometasone-formoterol  2 puff Inhalation BID  . sodium chloride  10-40 mL Intracatheter Q12H  . warfarin   Does not apply Once  .  Warfarin - Pharmacist Dosing Inpatient   Does not apply q1800    Infusions: . heparin 1,250 Units/hr (05/13/15 0036)    PRN Medications: acetaminophen, albuterol, ondansetron (ZOFRAN) IV, oxyCODONE-acetaminophen   Assessment:  1. A/C Biventricular HF 2. Acute DVT RLE 3. Acute PE- RLL noted on CT 4. Severe MR 5. NSVT 6. Cardiogenic Shock \ 7. Drug rash  Plan/Discussion:    Stable off milrinone.  Co-ox 73%. No lasix for now. No spiro with rash. Continue digoxin and  low dose losartan. BP soft unable to titrate meds. No bb with low output. Continue dig 0.25 mg daily.   He is VERY reluctant about considering home inotropes or VAD therapies.   Loading coumadin for DVT/PE. Continue heparin drip. INR 1.37 Pharmacy dosing coumadin.   Cardiac Rehab to follow up today.   CLEGG,AMY NP-C  7:45 AM Advanced Heart Failure Team Pager [phone] (M-F; 7a - 4p)  Please contact [facility] for night-coverage after hours (4p -7a ) and weekends on [email or web address]  Patient seen and examined with Tonye Becket, NP. We discussed all aspects of the encounter. I agree with the assessment and plan as stated above.   Improving slowly.  Tolerating low-dose HF meds. Volume status looks ok. Will likely need low-dose lasix after spiro being d/c'd.  Drug rash now worse. Inciting agent unclear. Seems to be spiro but could also be warfarin. Will give one dose prednisone today and also pepcid. If rash persists will change warfarin to DOAC.   Continue to ambulate.  SW Consult regarding home support needs.    Bensimhon, Daniel,MD 10:59 AM

## 2015-05-13 NOTE — Progress Notes (Signed)
CARDIAC REHAB PHASE I   PRE:  Rate/Rhythm: 89 SR PVCs  BP:  Supine: 94/60  Sitting:   Standing:    SaO2: 98%RA  MODE:  Ambulation: 1050 ft   POST:  Rate/Rhythm: 109 ST  BP:  Supine:   Sitting: 102/67  Standing:    SaO2: 99%RA 1055-1158 Pt walked 1050 ft on RA with steady gait. No DOE noted. Tired by end of walk. Parents in room and mother with lots of questions re low sodium foods and how pt needs to change his lifestyle. Looked for CHF booklet but mother stated it was at his home now. Gave new CHF booklet and reviewed all the zones. Parents are to get pt scales. Gave pt low sodium handouts and fluid handout. Pt eats a lot of processed and canned foods. Discussed some healthier food choices but will be adjustment for pt.  Explained to pt that heart is a pump and importance of low sodium and fluid restriction with low pumping.   Luetta Nutting, RN BSN  05/13/2015 11:54 AM

## 2015-05-14 ENCOUNTER — Inpatient Hospital Stay (HOSPITAL_COMMUNITY): Payer: BLUE CROSS/BLUE SHIELD

## 2015-05-14 DIAGNOSIS — I517 Cardiomegaly: Secondary | ICD-10-CM

## 2015-05-14 DIAGNOSIS — I509 Heart failure, unspecified: Secondary | ICD-10-CM

## 2015-05-14 LAB — BASIC METABOLIC PANEL
ANION GAP: 7 (ref 5–15)
BUN: 17 mg/dL (ref 6–20)
CALCIUM: 9.2 mg/dL (ref 8.9–10.3)
CHLORIDE: 100 mmol/L — AB (ref 101–111)
CO2: 28 mmol/L (ref 22–32)
Creatinine, Ser: 0.94 mg/dL (ref 0.61–1.24)
GFR calc non Af Amer: >60 mL/min (ref >60)
Glucose, Bld: 103 mg/dL — ABNORMAL HIGH (ref 65–99)
POTASSIUM: 4.2 mmol/L (ref 3.5–5.1)
Sodium: 135 mmol/L (ref 135–145)

## 2015-05-14 LAB — HEPARIN LEVEL (UNFRACTIONATED): Heparin Unfractionated: 0.46 IU/mL (ref 0.30–0.70)

## 2015-05-14 LAB — CBC
HEMATOCRIT: 46.5 % (ref 39.0–52.0)
Hemoglobin: 15.2 g/dL (ref 13.0–17.0)
MCH: 29.9 pg (ref 26.0–34.0)
MCHC: 32.7 g/dL (ref 30.0–36.0)
MCV: 91.5 fL (ref 78.0–100.0)
Platelets: 159 10*3/uL (ref 150–400)
RBC: 5.08 MIL/uL (ref 4.22–5.81)
RDW: 14.9 % (ref 11.5–15.5)
WBC: 5.3 10*3/uL (ref 4.0–10.5)

## 2015-05-14 LAB — PROTIME-INR
INR: 1.79 — AB (ref 0.00–1.49)
Prothrombin Time: 20.7 seconds — ABNORMAL HIGH (ref 11.6–15.2)

## 2015-05-14 LAB — CARBOXYHEMOGLOBIN
CARBOXYHEMOGLOBIN: 1.2 % (ref 0.5–1.5)
Carboxyhemoglobin: 1.2 % (ref 0.5–1.5)
METHEMOGLOBIN: 0.7 % (ref 0.0–1.5)
Methemoglobin: 0.7 % (ref 0.0–1.5)
O2 Saturation: 90 %
O2 Saturation: 74.1 %
TOTAL HEMOGLOBIN: 15.2 g/dL (ref 13.5–18.0)
Total hemoglobin: 15.6 g/dL (ref 13.5–18.0)

## 2015-05-14 MED ORDER — HYDROCORTISONE 1 % EX OINT
TOPICAL_OINTMENT | CUTANEOUS | Status: DC | PRN
  Filled 2015-05-14: qty 28.35

## 2015-05-14 MED ORDER — WARFARIN SODIUM 7.5 MG PO TABS
7.5000 mg | ORAL_TABLET | Freq: Once | ORAL | Status: AC
  Administered 2015-05-14: 7.5 mg via ORAL
  Filled 2015-05-14: qty 1

## 2015-05-14 NOTE — Progress Notes (Signed)
[facility] PROGRESS NOTE  Marcus Bates ZOX:[id number] DOB: [birth year] DOA: 04/30/2015 PCP: Lolita Patella, MD   Brief narrative 59 year old male was admitted to [facility] on 8/25 with elevated LFTs and symptoms of increased dyspnea with exertion for past 1 month along with lower extremity swelling. On admission he had a CT of his abdomen and pelvis which showed anasarca along with cardiomegaly and incidental PE in this segmental right lower lobe pulmonary artery. 2-D echo done showed a depressed EF of 10-15% with grade 3 diastolic dysfunction. Patient was transferred to [facility] for cardiac cath which showed nonobstructive disease.  Patient has since been continued on aggressive diuresis. Pt underwent RHC on 8/31. Pt continued on milnirnone which was ultimately weaned off. The patient also started coumadin per pharmacy dosing with heparin gtt. Pt now fully diuresed and is awaiting INR to reach therapeutic range of 2-3.  Assessment/Plan: Acute systolic and diastolic CHF with nonischemic cardiomyopathy -Cardiac cath revealed nonobstructive disease. Had been on low dose beta blocker and ramipril. Given low BP, have d/c'd ramipril. Cardiology has since d/c'd metoprolol as of 8/31. He is on losartan. Currently not on Lasix. -Pt with severe mitral regurgitation on echo.Cardiology and EP do not recommend TEE and role for valve repair at this time given severe regurgitation of unknown duration. -Cardiac MRI shows severe biventricular dilatation and impaired function and no LV thrombus seen. -Recommend to repeat a 2-D echo in 3 months to evaluate his heart function. - Patient underwent  RHC on 8/31 with findings of low output HF with cardiogenic shock physiology - Recently d/c'd beta blocker and acei given hypotension - Pt did not tolerate spironolactone given concerns of possible drug rxn. On topical steroids. - Patient is much improved. Has been ambulating without any  difficulties.  Acute DVT with right sided PE -Was started on IV heparin and switched to oral Xarelto on 8/28, changed to coumadin on 9/1 given social issues -Plan is for at least 6 mos, if not longer, anticoagulation given his on prolonged venous thromboembolic is in an severe cardiomyopathy and severe MR. - Await therapeutic INR.   Increasing dyspnea during the night with? Apnea -Patient recently reported waking up during feeling suffocated and gasping for air. Sats noted to be normal. Patient had been followed by Dr. Maple Hudson for excessive sleepiness and had sleep study in 8/2015consistent with nonspecific hypersomnia and ruled out for sleep apnea. -ABG was unremarkable. Possibly related to orthopnea and PND. Keep head of bed elevated at 30 at all times. -No o2 desaturation at rest or ambulation. No nocturnal o2 desat. Sniff test negative for diaphragmatic muscle denervation  -Patient continuing to feel better  Severe MR -Possibly secondary to cardiomyopathy.  -No role for TEE and valve replacement at this time as it will not improve his heart function.  Nonsustained V. tach -Evaluated by EP. Plan to repeat echo in 3 months for consideration of ICD. No indication for antiarrhythmics or LifeVest at this time. -Has multiple PVCs on the monitor. Asymptomatic. Unable to increase beta blocker dose due to hypotension. Potassium and Magnesium normal.  Hypotension -Possibly associated with severe CHF. Patient is asymptomatic. D/c'd beta blocker and ACEI. Pt remains on digoxin and lasix -Stable  Transaminitis -Possibly hepatic ischemia with CHF. Liver and gallbladder on admission CT unremarkable. LFTs improving.  DVT prophylaxis:coumadin  Code Status: Full Family Communication: Discussed with patient Disposition Plan: Possible discharge tomorrow if INR is therapeutic   Consultants:  Cardiology  Critical Care  Procedures:  Cath  8/26  RHC  8/31  Antibiotics: None  Subjective: Patient feels better. Denies any chest pain or shortness of breath.  Objective: Filed Vitals:   05/13/15 2033 05/13/15 2319 05/14/15 0415 05/14/15 0743  BP: 98/71 92/65 92/56  94/63  Pulse:      Temp: 97.5 F (36.4 C) 98 F (36.7 C) 97.8 F (36.6 C) 97.8 F (36.6 C)  TempSrc: Oral Oral Oral Oral  Resp: 16 18 16 18   Height:      Weight:   64.955 kg (143 lb 3.2 oz)   SpO2: 98% 99% 98% 98%    Intake/Output Summary (Last 24 hours) at 05/14/15 0821 Last data filed at 05/14/15 0600  Gross per 24 hour  Intake 466.15 ml  Output   1575 ml  Net -1108.85 ml   Filed Weights   05/12/15 0700 05/13/15 0449 05/14/15 0415  Weight: 64.6 kg (142 lb 6.7 oz) 64.275 kg (141 lb 11.2 oz) 64.955 kg (143 lb 3.2 oz)    Exam:   General:  Awake and alert. In no distress.  Cardiovascular: regular, s1, s2. Systolic murmur at the apex  Respiratory: normal resp effort, no wheezing  Abdomen: soft,nondistended, nontender. No masses or organomegaly   Data Reviewed: Basic Metabolic Panel:  Recent Labs Lab 05/08/15 0815  05/10/15 0021 05/11/15 0448 05/12/15 0505 05/13/15 0445 05/14/15 0506  NA  --   < > 136 135 136 138 135  K  --   < > 4.5 4.5 4.5 4.4 4.2  CL  --   < > 101 101 101 103 100*  CO2  --   < > 28 27 27 28 28   GLUCOSE  --   < > 96 87 80 80 103*  BUN  --   < > 16 15 14 14 17   CREATININE  --   < > 1.10 0.97 0.85 0.90 0.94  CALCIUM  --   < > 8.8* 8.8* 8.8* 9.2 9.2  MG 2.0  --   --   --   --   --   --   < > = values in this interval not displayed.  CBC:  Recent Labs Lab 05/08/15 0505 05/11/15 0449 05/12/15 0505 05/13/15 0445 05/14/15 0506  WBC 6.4 5.1 4.4 4.7 5.3  HGB 16.2 15.8 16.0 16.6 15.2  HCT 48.1 47.5 48.4 49.2 46.5  MCV 91.8 91.9 91.7 92.3 91.5  PLT 159 149* 147* 137* 159   BNP (last 3 results)  Recent Labs  05/01/15 0215  BNP 2662.2*    CBG: No results for input(s): GLUCAP in the last 168 hours.  Recent  Results (from the past 240 hour(s))  MRSA PCR Screening     Status: None   Collection Time: 05/09/15  9:20 AM  Result Value Ref Range Status   MRSA by PCR NEGATIVE NEGATIVE Final    Comment:        The GeneXpert MRSA Assay (FDA approved for NASAL specimens only), is one component of a comprehensive MRSA colonization surveillance program. It is not intended to diagnose MRSA infection nor to guide or monitor treatment for MRSA infections.      Studies: No results found.  Scheduled Meds: . clonazePAM  1 mg Oral QHS  . clonazePAM  1 mg Oral Once  . coumadin book   Does not apply Once  . digoxin  0.25 mg Oral Daily  . gabapentin  300 mg Oral QHS  . losartan  12.5 mg Oral Daily  . magnesium oxide  200 mg Oral Daily  . mometasone-formoterol  2 puff Inhalation BID  . sodium chloride  10-40 mL Intracatheter Q12H  . warfarin   Does not apply Once  . Warfarin - Pharmacist Dosing Inpatient   Does not apply q1800   Continuous Infusions: . heparin 1,100 Units/hr (05/13/15 2311)    Principal Problem:   Acute combined systolic and diastolic congestive heart failure Active Problems:   Moderate persistent asthma in adult without complication   Cardiomegaly   Abnormal LFTs   PE (pulmonary embolism)   Severe mitral regurgitation   LV (left ventricular) mural thrombus   NSVT (nonsustained ventricular tachycardia)   Renal insufficiency   AKI (acute kidney injury)   SOB (shortness of breath)   Other specified hypotension   Acute deep vein thrombosis (DVT) of popliteal vein of right lower extremity   Acute respiratory failure with hypoxia   NICM (nonischemic cardiomyopathy)    [facility]  [facility] Pager [phone].   If 7PM-7AM, please contact night-coverage at [email or web address], password [facility] 05/14/2015, 8:21 AM  LOS: 14 days

## 2015-05-14 NOTE — Progress Notes (Addendum)
CM consulted with pharmacy, pt had previously been placed on Xarelto, however pt plan now is to discharge home on coumadin instead of Xarelto. CM confirmed with bedside nurse that  Pt is on room air, CM asked bedside nurse to follow up with MD for  home DME oxygen order to be discontinued prior to discharge.

## 2015-05-14 NOTE — Progress Notes (Addendum)
Advanced Heart Failure Rounding Note   Subjective:     8/31 Cath lab for RHC/swan placement--> cardiogenic shock. Started on milrinone 0.25 mcg and diuresed with IV lasix. 9/1/ Milrinone 0.25 mcg. Started on sprio and losartan but was hypotensive. Given 250 cc NS. CO-OX 76%.   9/4 off milrinone .  9/5 CO-OX 67% spiro stopped due to rash 9/6 Received prednisone for drug rash ? Cleda Daub versus coumadin. Able to walk 1050 feet  Denies SOB/Orthopnea. Co-ox 74%. Rash persists despite prednisone and pepcid.   RHC 8/31 RA = 9 RV = 39/5/12 PA = 41/20 (28) PCW = 18 Fick cardiac output/index = 2.6/1.4 Therms cardiac output/index = 3.6/2.0 PVR = 3.8 WU FA sat = 98% PA sat = 54%, 57%   8/25 ECHO EF 10% RV dilated  8/25 + Acute DVT distal to mild popliteal vein RLE No DVT on Left  8/26 LHC -  1. Prox RCA to Dist RCA lesion, 20% stenosed. 2. Ost Cx to Dist Cx lesion, 20% stenosed. 3. Mid LAD lesion, 25% stenosed.   FH: Father --> CHF due to ischemic CM  Objective:   Weight Range:  Vital Signs:   Temp:  [97.3 F (36.3 C)-98 F (36.7 C)] 97.8 F (36.6 C) (09/07 0415) Resp:  [16-18] 16 (09/07 0415) BP: (92-102)/(56-71) 92/56 mmHg (09/07 0415) SpO2:  [97 %-99 %] 98 % (09/07 0415) Weight:  [143 lb 3.2 oz (64.955 kg)] 143 lb 3.2 oz (64.955 kg) (09/07 0415) Last BM Date: 05/12/15  Weight change: Filed Weights   05/12/15 0700 05/13/15 0449 05/14/15 0415  Weight: 142 lb 6.7 oz (64.6 kg) 141 lb 11.2 oz (64.275 kg) 143 lb 3.2 oz (64.955 kg)    Intake/Output:   Intake/Output Summary (Last 24 hours) at 05/14/15 0723 Last data filed at 05/14/15 0600  Gross per 24 hour  Intake 566.15 ml  Output   1575 ml  Net -1008.85 ml     Physical Exam: General: In  bed. NAD  HEENT: normal  Neck: supple. No jvd.  Carotids 2+ bilat; no bruits. No lymphadenopathy or thryomegaly appreciated. Cor: PMI nondisplaced. Regular rate & rhythm. No rubs, or murmurs. + s3  Lungs: clear Abdomen: soft,  nontender, non- distended. No hepatosplenomegaly. No bruits or masses. Good bowel sounds. Extremities: no cyanosis, clubbing, rash,no edema. Warm Neuro: alert & orientedx3, cranial nerves grossly intact. moves all 4 extremities w/o difficulty. Affect pleasant Skin: Diffuse maculopapular rash on trunk/arms.  Telemetry: SR 80s   Labs: Basic Metabolic Panel:  Recent Labs Lab 05/08/15 0815  05/10/15 0021 05/11/15 0448 05/12/15 0505 05/13/15 0445 05/14/15 0506  NA  --   < > 136 135 136 138 135  K  --   < > 4.5 4.5 4.5 4.4 4.2  CL  --   < > 101 101 101 103 100*  CO2  --   < > 28 27 27 28 28   GLUCOSE  --   < > 96 87 80 80 103*  BUN  --   < > 16 15 14 14 17   CREATININE  --   < > 1.10 0.97 0.85 0.90 0.94  CALCIUM  --   < > 8.8* 8.8* 8.8* 9.2 9.2  MG 2.0  --   --   --   --   --   --   < > = values in this interval not displayed.  Liver Function Tests: No results for input(s): AST, ALT, ALKPHOS, BILITOT, PROT, ALBUMIN in the last 168  hours. No results for input(s): LIPASE, AMYLASE in the last 168 hours. No results for input(s): AMMONIA in the last 168 hours.  CBC:  Recent Labs Lab 05/08/15 0505 05/11/15 0449 05/12/15 0505 05/13/15 0445 05/14/15 0506  WBC 6.4 5.1 4.4 4.7 5.3  HGB 16.2 15.8 16.0 16.6 15.2  HCT 48.1 47.5 48.4 49.2 46.5  MCV 91.8 91.9 91.7 92.3 91.5  PLT 159 149* 147* 137* 159    Cardiac Enzymes: No results for input(s): CKTOTAL, CKMB, CKMBINDEX, TROPONINI in the last 168 hours.  BNP: BNP (last 3 results)  Recent Labs  05/01/15 0215  BNP 2662.2*    ProBNP (last 3 results) No results for input(s): PROBNP in the last 8760 hours.    Other results:  Imaging: No results found.   Medications:     Scheduled Medications: . clonazePAM  1 mg Oral QHS  . clonazePAM  1 mg Oral Once  . coumadin book   Does not apply Once  . digoxin  0.25 mg Oral Daily  . gabapentin  300 mg Oral QHS  . losartan  12.5 mg Oral Daily  . magnesium oxide  200 mg Oral  Daily  . mometasone-formoterol  2 puff Inhalation BID  . sodium chloride  10-40 mL Intracatheter Q12H  . warfarin   Does not apply Once  . Warfarin - Pharmacist Dosing Inpatient   Does not apply q1800    Infusions: . heparin 1,100 Units/hr (05/13/15 2311)    PRN Medications: acetaminophen, albuterol, ondansetron (ZOFRAN) IV, oxyCODONE-acetaminophen   Assessment:  1. A/C Biventricular HF 2. Acute DVT RLE 3. Acute PE- RLL noted on CT 4. Severe MR 5. NSVT 6. Cardiogenic Shock \ 7. Drug rash  Plan/Discussion:    Stable off milrinone.  Co-ox 74%. No lasix for now. No spiro with rash. Continue digoxin and  low dose losartan. BP soft unable to titrate meds. No bb with low output. Renal function stable.   He is VERY reluctant about considering home inotropes or VAD therapies.   ? Rash seems to be from spiro. Cleda Daub stopped 9/5. Improved today. Received one dose of prednisone 9/6  Loading coumadin for DVT/PE. Continue heparin drip. INR 1.79 Pharmacy dosing coumadin.   Can transfer to tele and home next 24 hours.  CLEGG,AMY NP-C  7:23 AM Advanced Heart Failure Team Pager [phone] (M-F; 7a - 4p)  Please contact [facility] for night-coverage after hours (4p -7a ) and weekends on [email or web address]  Patient seen and examined with Tonye Becket, NP. We discussed all aspects of the encounter. I agree with the assessment and plan as stated above.   He is improving. Now off inotropes. CVP ok without any diuretics.Continue current regimen. Will try to get home tomorrow if INR >= 2.0, Suspect he will need some lasix at least prn or  every other day.   Will do limited echo to f/u on EF.  Rash seems worse. I am worried he may be allergic to warfarin. May need to switch to DOAC.   Bensimhon, Daniel,MD 8:58 AM

## 2015-05-14 NOTE — Progress Notes (Addendum)
CARDIAC REHAB PHASE I   PRE:  Rate/Rhythm: 93 SR  BP:  Sitting: 106/75        SaO2: 99 RA  MODE:  Ambulation: 550 ft   POST:  Rate/Rhythm: 105 ST  BP:  Sitting: 105/66         SaO2: 100 RA  Pt very reluctant to ambulate, states he is hungry and waiting for his food tray. Pt agreeable to ambulate with much encouragement. Pt ambulated 550 ft on RA, IV, independent, steady gait, tolerated well. Pt did c/o of mild dizziness, states he has experienced this every time he ambulates, denies DOE. Pt to edge of bed after walk per pt request, call bell within reach. Pt appreciative of walk. Will follow.   [id number]  Joylene Grapes, RN, BSN 05/14/2015 3:01 PM

## 2015-05-14 NOTE — Progress Notes (Signed)
Echocardiogram 2D Echocardiogram has been performed.  Dorothey Baseman 05/14/2015, 12:26 PM

## 2015-05-14 NOTE — Progress Notes (Signed)
Patient refuses dulera. RT will continue to monitor.

## 2015-05-14 NOTE — Progress Notes (Signed)
Noted pt's coox came back high.  Redrew lab via cvp line.  Marcus Bates

## 2015-05-14 NOTE — Progress Notes (Signed)
Pt walked 3 full times around unit.  Pt tolerated well.  Karena Addison T

## 2015-05-14 NOTE — Progress Notes (Signed)
ANTICOAGULATION CONSULT NOTE - Follow Up Consult  Pharmacy Consult for Heparin and Warfarin Indication: pulmonary embolus and DVT  Allergies  Allergen Reactions  . Spironolactone Rash  . Aspirin     REACTION: facial swelling  . Dust Mite Extract   . Other     Cats   . Peanuts [Peanut Oil]     sob   Patient Measurements: Height: 5' 8.5" (174 cm) Weight: 143 lb 3.2 oz (64.955 kg) IBW/kg (Calculated) : 69.55  Vital Signs: Temp: 97.8 F (36.6 C) (09/07 0743) Temp Source: Oral (09/07 0743) BP: 94/63 mmHg (09/07 0743) Pulse Rate: 89 (09/07 1001)  Labs:  Recent Labs  05/12/15 0505 05/13/15 0445 05/14/15 0506 05/14/15 0507  HGB 16.0 16.6 15.2  --   HCT 48.4 49.2 46.5  --   PLT 147* 137* 159  --   LABPROT 16.2* 17.0*  --  20.7*  INR 1.29 1.37  --  1.79*  HEPARINUNFRC 0.58 0.71*  --  0.46  CREATININE 0.85 0.90 0.94  --     Estimated Creatinine Clearance: 77.8 mL/min (by C-G formula based on Cr of 0.94).  Assessment: 58yom initially started on IV heparin for his new RLL PE (per CT abd/pelvis 8/24) and RLE DVT (per dopplers 8/25). He was transitioned to xarelto on 8/28. He has now been transitioned to Coumadin in case needs LVAD.   Heparin level is at goal today 0.4 - no dose change INR is 1.7 on day 4 of heparin/coumadin overlap (goal of at least 5 days): . Hg/hct stable, plt= 159  Drug rash believed to be spiro as it is improving with spiro stopped and with steroids  Goal of Therapy:  Heparin level 0.3-0.7 units/ml  INR goal 2-3 Monitor platelets by anticoagulation protocol: Yes   Plan:  -Continue heparin at 1100 units/hr -Coumadin 7.5 mg po tonight -Heparin level, INR, and CBC daily  Sheppard Coil PharmD., BCPS Clinical Pharmacist Pager [phone] 05/14/2015 11:18 AM

## 2015-05-14 NOTE — Care Management Note (Signed)
Case Management Note  Patient Details  Name: Marcus Bates MRN: [id number] Date of Birth: 06/18/1956  Subjective/Objective:      Adm w heart failure              Action/Plan:lives alone, parent visiting from [facility]   Expected Discharge Date:                 Expected Discharge Plan:  Home w Home Health Services  In-House Referral:     Discharge planning Services  CM Consult  Post Acute Care Choice:  Home Health Choice offered to:  Patient  DME Arranged:    DME Agency:     HH Arranged:  RN, Disease Management HH Agency:  [facility]  Status of Service:  Completed, signed off  Medicare Important Message Given:    Date Medicare IM Given:    Medicare IM give by:    Date Additional Medicare IM Given:    Additional Medicare Important Message give by:     If discussed at Long Length of Stay Meetings, dates discussed:    Additional Comments: parent helping pt to hire help for cleaning -meals after disch. Pt would like hhrn for chf follow up. Went over lists and no pref . Ref to donna w [facility] for hhrn.  [name], RN 05/14/2015, 11:12 AM

## 2015-05-15 ENCOUNTER — Inpatient Hospital Stay: Payer: BLUE CROSS/BLUE SHIELD | Admitting: Adult Health

## 2015-05-15 LAB — CBC
HCT: 48 % (ref 39.0–52.0)
Hemoglobin: 15.8 g/dL (ref 13.0–17.0)
MCH: 30.3 pg (ref 26.0–34.0)
MCHC: 32.9 g/dL (ref 30.0–36.0)
MCV: 92.1 fL (ref 78.0–100.0)
PLATELETS: 173 10*3/uL (ref 150–400)
RBC: 5.21 MIL/uL (ref 4.22–5.81)
RDW: 15.1 % (ref 11.5–15.5)
WBC: 5.3 10*3/uL (ref 4.0–10.5)

## 2015-05-15 LAB — BASIC METABOLIC PANEL
ANION GAP: 6 (ref 5–15)
BUN: 19 mg/dL (ref 6–20)
CO2: 29 mmol/L (ref 22–32)
Calcium: 9.1 mg/dL (ref 8.9–10.3)
Chloride: 102 mmol/L (ref 101–111)
Creatinine, Ser: 0.9 mg/dL (ref 0.61–1.24)
Glucose, Bld: 83 mg/dL (ref 65–99)
POTASSIUM: 4.5 mmol/L (ref 3.5–5.1)
SODIUM: 137 mmol/L (ref 135–145)

## 2015-05-15 LAB — HEPARIN LEVEL (UNFRACTIONATED): HEPARIN UNFRACTIONATED: 0.4 [IU]/mL (ref 0.30–0.70)

## 2015-05-15 LAB — PROTIME-INR
INR: 2.02 — AB (ref 0.00–1.49)
PROTHROMBIN TIME: 22.7 s — AB (ref 11.6–15.2)

## 2015-05-15 LAB — CARBOXYHEMOGLOBIN
Carboxyhemoglobin: 1.4 % (ref 0.5–1.5)
Methemoglobin: 0.5 % (ref 0.0–1.5)
O2 SAT: 68.7 %
Total hemoglobin: 15.6 g/dL (ref 13.5–18.0)

## 2015-05-15 MED ORDER — APIXABAN 5 MG PO TABS: 10.0000 mg | ORAL_TABLET | Freq: Two times a day (BID) | ORAL | Status: DC

## 2015-05-15 MED ORDER — SODIUM CHLORIDE 0.9 % IJ SOLN
10.0000 mL | INTRAMUSCULAR | Status: DC | PRN
  Administered 2015-05-15 – 2015-05-16 (×3): 10 mL
  Filled 2015-05-15 (×2): qty 40

## 2015-05-15 MED ORDER — APIXABAN 5 MG PO TABS
5.0000 mg | ORAL_TABLET | Freq: Two times a day (BID) | ORAL | Status: DC
  Administered 2015-05-15 – 2015-05-16 (×3): 5 mg via ORAL
  Filled 2015-05-15 (×5): qty 1

## 2015-05-15 MED ORDER — APIXABAN 5 MG PO TABS: 5.0000 mg | ORAL_TABLET | Freq: Two times a day (BID) | ORAL | Status: DC

## 2015-05-15 NOTE — Progress Notes (Signed)
ANTICOAGULATION CONSULT NOTE - Follow Up Consult  Pharmacy Consult for Heparin and Warfarin>>Apixaban Indication: pulmonary embolus and DVT  Allergies  Allergen Reactions  . Spironolactone Rash  . Aspirin     REACTION: facial swelling  . Dust Mite Extract   . Other     Cats   . Peanuts [Peanut Oil]     sob   Patient Measurements: Height: 5' 8.5" (174 cm) Weight: 142 lb 3.2 oz (64.5 kg) IBW/kg (Calculated) : 69.55  Vital Signs: Temp: 97.5 F (36.4 C) (09/08 0430) Temp Source: Oral (09/08 0430) BP: 93/62 mmHg (09/08 0430) Pulse Rate: 83 (09/08 0430)  Labs:  Recent Labs  05/13/15 0445 05/14/15 0506 05/14/15 0507 05/15/15 0430  HGB 16.6 15.2  --  15.8  HCT 49.2 46.5  --  48.0  PLT 137* 159  --  173  LABPROT 17.0*  --  20.7* 22.7*  INR 1.37  --  1.79* 2.02*  HEPARINUNFRC 0.71*  --  0.46 0.40  CREATININE 0.90 0.94  --  0.90    Estimated Creatinine Clearance: 80.6 mL/min (by C-G formula based on Cr of 0.9).  Assessment: 58yom initially started on IV heparin for his new RLL PE (per CT abd/pelvis 8/24) and RLE DVT (per dopplers 8/25). He was transitioned to xarelto on 8/28. He was then transitioned to Coumadin in case needs LVAD.   Heparin level is at goal today 0.4, INR therapeutic x1 at 2.0 slight trend up today.  Currently on day 6 of heparin/coumadin overlap (goal of at least 5 days).  Drug rash not improved, will stop heparin/warfarin as possible offending agents. New orders to transition to apixaban this am. Given new PE/DVT will start apixaban today with INR of 2. Patient has received over 10 days of parenteral/full oral anticoagulation, will start maintenance dose of apixaban 5mg  bid this am.  Goal of Therapy:  Monitor platelets by anticoagulation protocol: Yes   Plan:  -D/c heparin -D/c warfarin - will also d/c INR's as they will be confounded with apixaban use -Check CBC periodically -Apixaban 5mg  bid starting this am  Sheppard Coil PharmD.,  BCPS Clinical Pharmacist Pager [phone] 05/15/2015 10:30 AM

## 2015-05-15 NOTE — Progress Notes (Addendum)
05/16/2015:  Pt was informed that post the grace period of $10 monthly copay period; copay would be approximately 25% of cost of med going forward.   05/15/15: CM was consulted by MD for Eliquis.  CM provided pt with both free 30 day card and $10 copay card.  Pt stated he preferred to use [facility] near [facility], CM contacted pharmacy and was informed that medication is available for pick up.  CM submitted benefit check.   05/14/15: CM consulted with pharmacy, pt had previously been placed on Xarelto, however pt plan now is to discharge home on coumadin instead of Xarelto. CM confirmed with bedside nurse that  Pt is on room air, CM asked bedside nurse to follow up with MD for  home DME oxygen order to be discontinued prior to discharge.

## 2015-05-15 NOTE — Progress Notes (Signed)
CARDIAC REHAB PHASE I   PRE:  Rate/Rhythm: 91 SR  BP:  Supine: 84/60 manual  Sitting:   Standing:    SaO2: 96%RA  MODE:  Ambulation: 890 ft   POST:  Rate/Rhythm: 96  BP:  Supine:   Sitting: 86/60 manual  Standing:    SaO2: 97%RA 1004-1048 Pt walked 890 ft with steady gait. No dizziness or SOB. Tolerated well with low BP. Ex ed completed with pt and discussed CRP 2. Will refer to [place] program.    Luetta Nutting, RN BSN  05/15/2015 10:45 AM

## 2015-05-15 NOTE — Progress Notes (Signed)
Advanced Heart Failure Rounding Note   Subjective:     8/31 Cath lab for RHC/swan placement--> cardiogenic shock. Started on milrinone 0.25 mcg and diuresed with IV lasix. 9/1/ Milrinone 0.25 mcg. Started on sprio and losartan but was hypotensive. Given 250 cc NS. CO-OX 76%.   9/4 off milrinone .  9/5 CO-OX 67% spiro stopped due to rash 9/6 Received prednisone for drug rash ? Cleda Daub versus coumadin. Able to walk 1050 feet 9/7 Transferred to tele.   Feeling better. Denies SOB/Orthopnea. Walking with CR. BP soft but ok.   Rash persists despite prednisone and pepcid.   RHC 8/31 RA = 9 RV = 39/5/12 PA = 41/20 (28) PCW = 18 Fick cardiac output/index = 2.6/1.4 Therms cardiac output/index = 3.6/2.0 PVR = 3.8 WU FA sat = 98% PA sat = 54%, 57%   8/25 ECHO EF 10% RV dilated  8/25 + Acute DVT distal to mild popliteal vein RLE No DVT on Left  8/26 LHC -  1. Prox RCA to Dist RCA lesion, 20% stenosed. 2. Ost Cx to Dist Cx lesion, 20% stenosed. 3. Mid LAD lesion, 25% stenosed.   FH: Father --> CHF due to ischemic CM  Objective:   Weight Range:  Vital Signs:   Temp:  [97.4 F (36.3 C)-97.7 F (36.5 C)] 97.5 F (36.4 C) (09/08 0430) Pulse Rate:  [83-92] 83 (09/08 0430) Resp:  [18-19] 18 (09/08 0430) BP: (88-105)/(57-73) 93/62 mmHg (09/08 0430) SpO2:  [98 %-99 %] 98 % (09/08 0430) Weight:  [142 lb 3.2 oz (64.5 kg)] 142 lb 3.2 oz (64.5 kg) (09/08 0430) Last BM Date: 05/14/15  Weight change: Filed Weights   05/13/15 0449 05/14/15 0415 05/15/15 0430  Weight: 141 lb 11.2 oz (64.275 kg) 143 lb 3.2 oz (64.955 kg) 142 lb 3.2 oz (64.5 kg)    Intake/Output:   Intake/Output Summary (Last 24 hours) at 05/15/15 1211 Last data filed at 05/15/15 0800  Gross per 24 hour  Intake    520 ml  Output   1325 ml  Net   -805 ml     Physical Exam: General: In  bed. NAD  HEENT: normal  Neck: supple. No jvd.  Carotids 2+ bilat; no bruits. No lymphadenopathy or thryomegaly appreciated. Cor:  PMI nondisplaced. Regular rate & rhythm. No rubs, or murmurs. + s3  Lungs: clear Abdomen: soft, nontender, non- distended. No hepatosplenomegaly. No bruits or masses. Good bowel sounds. Extremities: no cyanosis, clubbing, rash,no edema. Warm Neuro: alert & orientedx3, cranial nerves grossly intact. moves all 4 extremities w/o difficulty. Affect pleasant Skin: Diffuse maculopapular rash on trunk/arms/legs.  Telemetry: SR 80s   Labs: Basic Metabolic Panel:  Recent Labs Lab 05/11/15 0448 05/12/15 0505 05/13/15 0445 05/14/15 0506 05/15/15 0430  NA 135 136 138 135 137  K 4.5 4.5 4.4 4.2 4.5  CL 101 101 103 100* 102  CO2 GLUCOSE 87 80 80 103* 83  BUN CREATININE 0.97 0.85 0.90 0.94 0.90  CALCIUM 8.8* 8.8* 9.2 9.2 9.1    Liver Function Tests: No results for input(s): AST, ALT, ALKPHOS, BILITOT, PROT, ALBUMIN in the last 168 hours. No results for input(s): LIPASE, AMYLASE in the last 168 hours. No results for input(s): AMMONIA in the last 168 hours.  CBC:  Recent Labs Lab 05/11/15 0449 05/12/15 0505 05/13/15 0445 05/14/15 0506 05/15/15 0430  WBC 5.1 4.4 4.7 5.3 5.3  HGB 15.8 16.0 16.6 15.2 15.8  HCT  47.5 48.4 49.2 46.5 48.0  MCV 91.9 91.7 92.3 91.5 92.1  PLT 149* 147* 137* 159 173    Cardiac Enzymes: No results for input(s): CKTOTAL, CKMB, CKMBINDEX, TROPONINI in the last 168 hours.  BNP: BNP (last 3 results)  Recent Labs  05/01/15 0215  BNP 2662.2*    ProBNP (last 3 results) No results for input(s): PROBNP in the last 8760 hours.    Other results:  Imaging: No results found.   Medications:     Scheduled Medications: . apixaban  5 mg Oral BID  . clonazePAM  1 mg Oral QHS  . clonazePAM  1 mg Oral Once  . digoxin  0.25 mg Oral Daily  . gabapentin  300 mg Oral QHS  . losartan  12.5 mg Oral Daily  . magnesium oxide  200 mg Oral Daily  . sodium chloride  10-40 mL Intracatheter Q12H    Infusions:    PRN  Medications: acetaminophen, albuterol, hydrocortisone, ondansetron (ZOFRAN) IV, oxyCODONE-acetaminophen   Assessment:  1. A/C Biventricular HF 2. Acute DVT RLE 3. Acute PE- RLL noted on CT 4. Severe MR 5. NSVT 6. Cardiogenic Shock \ 7. Drug rash  Plan/Discussion:    Repeat 05/14/15 ECHO EF 20-25% RV moderately dilated.   Stable off milrinone. No lasix for now. No spiro with rash. Continue digoxin and  low dose losartan. BP soft unable to titrate meds. No bb with low output. Renal function stable.   He is VERY reluctant about considering home inotropes or VAD therapies.   ? Rash spiro versus coumadin.  Received one dose of prednisone 9/6  Stop coumadin/heparin due to rash.     Amy Clegg NP-C 12:11 PM Advanced Heart Failure Team Pager [phone] (M-F; 7a - 4p)  Please contact [facility] for night-coverage after hours (4p -7a ) and weekends on [email or web address]  Patient seen and examined with Tonye Becket, NP. We discussed all aspects of the encounter. I agree with the assessment and plan as stated above.   He is stabilizing. Off inotropes. EF still very low on echo with severe biventricular function. Will need to watch closely. Rash persists and now I think it may be coumadin and not spiro. Will switch warfarin to Apixaban for DVT/PE. Hopefully home tomorrow. i think he will need lasix 20 mg q M/W/F and prn  Khayman Kirsch,MD 12:14 PM

## 2015-05-15 NOTE — Progress Notes (Signed)
Heparin stopped at 1200.  Jaxston Chohan, Charlaine Dalton RN

## 2015-05-15 NOTE — Progress Notes (Signed)
[facility] PROGRESS NOTE  Marcus Bates ZOX:[id number] DOB: 13-Apr-1956 DOA: 04/30/2015 PCP: Marcus Patella, MD   Brief narrative 59 year old male was admitted to [facility] on 8/25 with elevated LFTs and symptoms of increased dyspnea with exertion for past 1 month along with lower extremity swelling. On admission he had a CT of his abdomen and pelvis which showed anasarca along with cardiomegaly and incidental PE in this segmental right lower lobe pulmonary artery. 2-D echo done showed a depressed EF of 10-15% with grade 3 diastolic dysfunction. Patient was transferred to [facility] for cardiac cath which showed nonobstructive disease.  Patient has since been continued on aggressive diuresis. Pt underwent RHC on 8/31. Pt continued on milnirnone which was ultimately weaned off. The patient also started coumadin per pharmacy dosing with heparin gtt. Pt now fully diuresed and was awaiting INR to reach therapeutic range of 2-3. In the meantime, he developed a rash. This rash is thought to be secondary to Coumadin. So Coumadin was discontinued. Patient placed on Apixaban.  Assessment/Plan: Acute systolic and diastolic CHF with nonischemic cardiomyopathy -Cardiac cath revealed nonobstructive disease. Had been on low dose beta blocker and ramipril. Given low BP, have d/c'd ramipril. Cardiology has since d/c'd metoprolol as of 8/31. He is on losartan. Currently not on Lasix. -Pt with severe mitral regurgitation on echo.Cardiology and EP do not recommend TEE and role for valve repair at this time given severe regurgitation of unknown duration. -Cardiac MRI shows severe biventricular dilatation and impaired function and no LV thrombus seen. -Recommend to repeat a 2-D echo in 3 months to evaluate his heart function. - Patient underwent  RHC on 8/31 with findings of low output HF with cardiogenic shock physiology - Recently d/c'd beta blocker and acei given hypotension - Pt did not  tolerate spironolactone given concerns of possible drug rxn. On topical steroids. - Patient is much improved. Has been ambulating without any difficulties. Currently off of diuretics. Cardiology to determine home diuretic regimen.  Acute DVT with right sided PE -Was started on IV heparin and switched to oral Xarelto on 8/28, changed to coumadin on 9/1 given social issues. Now on Apixaban due to rash from warfarin. -Plan is for at least 6 mos, if not longer, anticoagulation given his on prolonged venous thromboembolic is in an severe cardiomyopathy and severe MR.  Increasing dyspnea during the night with? Apnea -Patient recently reported waking up during feeling suffocated and gasping for air. Sats noted to be normal. Patient had been followed by Marcus Bates for excessive sleepiness and had sleep study in 8/2015consistent with nonspecific hypersomnia and ruled out for sleep apnea. -ABG was unremarkable. Possibly related to orthopnea and PND. Keep head of bed elevated at 30 at all times. -No o2 desaturation at rest or ambulation. No nocturnal o2 desat. Sniff test negative for diaphragmatic muscle denervation  -Patient continuing to feel better. His symptoms were likely secondary to CHF.  Severe MR -Possibly secondary to cardiomyopathy.  -No role for TEE and valve replacement at this time as it will not improve his heart function.  Nonsustained V. tach -Evaluated by EP. Plan to repeat echo in 3 months for consideration of ICD. No indication for antiarrhythmics or LifeVest at this time. -Has multiple PVCs on the monitor. Asymptomatic. Unable to increase beta blocker dose due to hypotension. Potassium and Magnesium normal.  Hypotension -Possibly associated with severe CHF. Patient is asymptomatic. D/c'd beta blocker and ACEI. Pt remains on digoxin and lasix -Stable  Transaminitis -Possibly hepatic ischemia  with CHF. Liver and gallbladder on admission CT unremarkable. LFTs improving.  DVT  prophylaxis: Apixaban  Code Status: Full Family Communication: Discussed with patient Disposition Plan: Anticipate discharge tomorrow   Consultants:  Cardiology  Critical Care  Procedures:  Cath 8/26  RHC 8/31  Antibiotics: None  Subjective: Patient feels well. States that the rash is better. Denies chest pain or shortness of breath.   Objective: Filed Vitals:   05/14/15 1001 05/14/15 1256 05/14/15 2100 05/15/15 0430  BP:  105/73 88/57 93/62   Pulse: 89 92 91 83  Temp:  97.4 F (36.3 C) 97.7 F (36.5 C) 97.5 F (36.4 C)  TempSrc:  Oral Oral Oral  Resp:  19 18 18   Height:      Weight:    64.5 kg (142 lb 3.2 oz)  SpO2:  98% 99% 98%    Intake/Output Summary (Last 24 hours) at 05/15/15 1051 Last data filed at 05/15/15 0800  Gross per 24 hour  Intake    531 ml  Output   1325 ml  Net   -794 ml   Filed Weights   05/13/15 0449 05/14/15 0415 05/15/15 0430  Weight: 64.275 kg (141 lb 11.2 oz) 64.955 kg (143 lb 3.2 oz) 64.5 kg (142 lb 3.2 oz)    Exam:   General:  Awake and alert. In no distress.  Cardiovascular: regular, s1, s2. Systolic murmur at the apex  Respiratory: normal resp effort, no wheezing  Abdomen: soft, nondistended, nontender. No masses or organomegaly   Data Reviewed: Basic Metabolic Panel:  Recent Labs Lab 05/11/15 0448 05/12/15 0505 05/13/15 0445 05/14/15 0506 05/15/15 0430  NA 135 136 138 135 137  K 4.5 4.5 4.4 4.2 4.5  CL 101 101 103 100* 102  CO2 27 27 28 28 29   GLUCOSE 87 80 80 103* 83  BUN 15 14 14 17 19   CREATININE 0.97 0.85 0.90 0.94 0.90  CALCIUM 8.8* 8.8* 9.2 9.2 9.1    CBC:  Recent Labs Lab 05/11/15 0449 05/12/15 0505 05/13/15 0445 05/14/15 0506 05/15/15 0430  WBC 5.1 4.4 4.7 5.3 5.3  HGB 15.8 16.0 16.6 15.2 15.8  HCT 47.5 48.4 49.2 46.5 48.0  MCV 91.9 91.7 92.3 91.5 92.1  PLT 149* 147* 137* 159 173   BNP (last 3 results)  Recent Labs  05/01/15 0215  BNP 2662.2*    CBG: No results for input(s):  GLUCAP in the last 168 hours.  Recent Results (from the past 240 hour(s))  MRSA PCR Screening     Status: None   Collection Time: 05/09/15  9:20 AM  Result Value Ref Range Status   MRSA by PCR NEGATIVE NEGATIVE Final    Comment:        The GeneXpert MRSA Assay (FDA approved for NASAL specimens only), is one component of a comprehensive MRSA colonization surveillance program. It is not intended to diagnose MRSA infection nor to guide or monitor treatment for MRSA infections.      Studies: No results found.  Scheduled Meds: . apixaban  5 mg Oral BID  . clonazePAM  1 mg Oral QHS  . clonazePAM  1 mg Oral Once  . digoxin  0.25 mg Oral Daily  . gabapentin  300 mg Oral QHS  . losartan  12.5 mg Oral Daily  . magnesium oxide  200 mg Oral Daily  . sodium chloride  10-40 mL Intracatheter Q12H   Continuous Infusions:    Principal Problem:   Acute combined systolic and diastolic congestive heart  failure Active Problems:   Moderate persistent asthma in adult without complication   Cardiomegaly   Abnormal LFTs   PE (pulmonary embolism)   Severe mitral regurgitation   LV (left ventricular) mural thrombus   NSVT (nonsustained ventricular tachycardia)   Renal insufficiency   AKI (acute kidney injury)   SOB (shortness of breath)   Other specified hypotension   Acute deep vein thrombosis (DVT) of popliteal vein of right lower extremity   Acute respiratory failure with hypoxia   NICM (nonischemic cardiomyopathy)    [facility]  [facility] Pager [phone].   If 7PM-7AM, please contact night-coverage at [email or web address], password [facility] 05/15/2015, 10:51 AM  LOS: 15 days

## 2015-05-16 LAB — CBC
HCT: 47.3 % (ref 39.0–52.0)
Hemoglobin: 15.7 g/dL (ref 13.0–17.0)
MCH: 30.6 pg (ref 26.0–34.0)
MCHC: 33.2 g/dL (ref 30.0–36.0)
MCV: 92.2 fL (ref 78.0–100.0)
PLATELETS: 147 10*3/uL — AB (ref 150–400)
RBC: 5.13 MIL/uL (ref 4.22–5.81)
RDW: 15.1 % (ref 11.5–15.5)
WBC: 5 10*3/uL (ref 4.0–10.5)

## 2015-05-16 LAB — BASIC METABOLIC PANEL
Anion gap: 6 (ref 5–15)
BUN: 19 mg/dL (ref 6–20)
CALCIUM: 9.2 mg/dL (ref 8.9–10.3)
CO2: 30 mmol/L (ref 22–32)
CREATININE: 0.9 mg/dL (ref 0.61–1.24)
Chloride: 101 mmol/L (ref 101–111)
GFR calc Af Amer: >60 mL/min (ref >60)
Glucose, Bld: 80 mg/dL (ref 65–99)
Potassium: 4.3 mmol/L (ref 3.5–5.1)
SODIUM: 137 mmol/L (ref 135–145)

## 2015-05-16 MED ORDER — LOSARTAN POTASSIUM 25 MG PO TABS: 12.5000 mg | ORAL_TABLET | Freq: Every day | ORAL | Status: DC

## 2015-05-16 MED ORDER — HYDROCORTISONE 1 % EX OINT: TOPICAL_OINTMENT | Freq: Three times a day (TID) | CUTANEOUS | Status: DC | PRN

## 2015-05-16 MED ORDER — APIXABAN 5 MG PO TABS: 5.0000 mg | ORAL_TABLET | Freq: Two times a day (BID) | ORAL | Status: DC

## 2015-05-16 MED ORDER — FUROSEMIDE 40 MG PO TABS: 20.0000 mg | ORAL_TABLET | ORAL | Status: DC

## 2015-05-16 MED ORDER — DIGOXIN 250 MCG PO TABS: 0.2500 mg | ORAL_TABLET | Freq: Every day | ORAL | Status: DC

## 2015-05-16 NOTE — Discharge Summary (Signed)
[facility]  Physician Discharge Summary   Patient ID: Marcus Bates MRN: [id number] DOB/AGE: 08-Dec-1955 59 y.o.  Admit date: 04/30/2015 Discharge date: 05/16/2015  PCP: Lolita Patella, MD  DISCHARGE DIAGNOSES:  Principal Problem:   Acute combined systolic and diastolic congestive heart failure Active Problems:   Moderate persistent asthma in adult without complication   Cardiomegaly   Abnormal LFTs   PE (pulmonary embolism)   Severe mitral regurgitation   LV (left ventricular) mural thrombus   NSVT (nonsustained ventricular tachycardia)   Renal insufficiency   AKI (acute kidney injury)   SOB (shortness of breath)   Other specified hypotension   Acute deep vein thrombosis (DVT) of popliteal vein of right lower extremity   Acute respiratory failure with hypoxia   NICM (nonischemic cardiomyopathy)   RECOMMENDATIONS FOR OUTPATIENT FOLLOW UP: 1. Home health RN  DISCHARGE CONDITION: fair  Diet recommendation: Heart healthy  Filed Weights   05/14/15 0415 05/15/15 0430 05/16/15 0429  Weight: 64.955 kg (143 lb 3.2 oz) 64.5 kg (142 lb 3.2 oz) 63.8 kg (140 lb 10.5 oz)    INITIAL HISTORY: 59 year old male was admitted to [facility] on 8/25 with elevated LFTs and symptoms of increased dyspnea with exertion for past 1 month along with lower extremity swelling. On admission he had a CT of his abdomen and pelvis which showed anasarca along with cardiomegaly and incidental PE in this segmental right lower lobe pulmonary artery. 2-D echo done showed a depressed EF of 10-15% with grade 3 diastolic dysfunction. Patient was transferred to [facility] for cardiac cath which showed nonobstructive disease.  Consultants:  Cardiology  Critical Care  Procedures:  Cath 8/26  RHC 8/31  Echocardiogram 8/25 Study Conclusions - Left ventricle: Possible laminated echodense area on inferiorwall, cannot exclude laminated thrombus. The cavity size wasmoderately  dilated. Systolic function was normal. The estimatedejection fraction was in the range of 10% to 15%. Severe diffusehypokinesis. Although no diagnostic regional wall motionabnormality was identified, this possibility cannot be completelyexcluded on the basis of this study. Doppler parameters areconsistent with a reversible restrictive pattern, indicative ofdecreased left ventricular diastolic compliance and/or increasedleft atrial pressure (grade 3 diastolic dysfunction). - Mitral valve: There was severe regurgitation. - Left atrium: The atrium was severely dilated. - Right ventricle: The cavity size was dilated. Wall thickness was normal. - Right atrium: The atrium was moderately dilated.  HOSPITAL COURSE:   Acute systolic and diastolic CHF with nonischemic cardiomyopathy Patient was diagnosed with severe systolic congestive heart failure. Cardiac cath revealed nonobstructive disease. He was started on Lasix. He was seen by cardiology. He had low cardiac output based on right catheterization. He was given inotropic's. Patient slowly started improving. He was given beta blocker. However, his blood pressure did not tolerate. So he was taken off the beta blocker as well as ACE inhibitor. He was actually started on ARB, which he seems to be tolerating well. Patient also started on digoxin. He will be asked to take Lasix 3 times a week. Pt with severe mitral regurgitation on echo.Cardiology and EP do not recommend TEE and also bleeding. No role for valve repair at this time given severe regurgitation of unknown duration. Cardiac MRI shows severe biventricular dilatation and impaired function and no LV thrombus seen. Recommend to repeat a 2-D echo in 3 months to evaluate his heart function. Pt did not tolerate spironolactone given concerns of possible drug rxn. Patient is much improved. Has been ambulating without any difficulties.   Acute DVT with right sided  PE He was initially started on IV heparin  and switched to oral Xarelto on 8/28. Changed to coumadin on 9/1 given social issues. He developed a rash from Coumadin. So he was switched over to Apixaban. Plan is for at least 6 mos of anticoagulation.  Increasing dyspnea during the night with? Apnea Prior to hospitalization patient reported waking up feeling suffocated and gasping for air. Sats noted to be normal. Patient had been followed by Dr. Maple Hudson for excessive sleepiness and had sleep study in 8/2015consistent with nonspecific hypersomnia and ruled out for sleep apnea. ABG was unremarkable. Possibly related to orthopnea and PND. His symptoms were likely secondary to CHF.  Severe MR Possibly secondary to cardiomyopathy. No role for TEE and valve replacement at this time as it will not improve his heart function.  Nonsustained V. tach Evaluated by EP. Plan to repeat echo in 3 months for consideration of ICD. No indication for antiarrhythmics or LifeVest at this time. Has multiple PVCs on the monitor. Asymptomatic. Unable to use beta blocker due to hypotension. Potassium and Magnesium normal.  Hypotension Possibly associated with severe CHF. Patient is asymptomatic. D/c'd beta blocker and ACEI. Pt remains on digoxin and lasix.   Transaminitis Possibly hepatic ischemia with CHF. Liver and gallbladder on admission CT unremarkable. LFTs improving.  Overall, patient is improved. Cleared by cardiology for discharge.   PERTINENT LABS:  The results of significant diagnostics from this hospitalization (including imaging, microbiology, ancillary and laboratory) are listed below for reference.    Microbiology: Recent Results (from the past 240 hour(s))  MRSA PCR Screening     Status: None   Collection Time: 05/09/15  9:20 AM  Result Value Ref Range Status   MRSA by PCR NEGATIVE NEGATIVE Final    Comment:        The GeneXpert MRSA Assay (FDA approved for NASAL specimens only), is one component of a comprehensive MRSA  colonization surveillance program. It is not intended to diagnose MRSA infection nor to guide or monitor treatment for MRSA infections.      Labs: Basic Metabolic Panel:  Recent Labs Lab 05/12/15 0505 05/13/15 0445 05/14/15 0506 05/15/15 0430 05/16/15 0521  NA 136 138 135 137 137  K 4.5 4.4 4.2 4.5 4.3  CL 101 103 100* 102 101  CO2 27 28 28 29 30   GLUCOSE 80 80 103* 83 80  BUN 14 14 17 19 19   CREATININE 0.85 0.90 0.94 0.90 0.90  CALCIUM 8.8* 9.2 9.2 9.1 9.2   CBC:  Recent Labs Lab 05/12/15 0505 05/13/15 0445 05/14/15 0506 05/15/15 0430 05/16/15 0521  WBC 4.4 4.7 5.3 5.3 5.0  HGB 16.0 16.6 15.2 15.8 15.7  HCT 48.4 49.2 46.5 48.0 47.3  MCV 91.7 92.3 91.5 92.1 92.2  PLT 147* 137* 159 173 147*   BNP: BNP (last 3 results)  Recent Labs  05/01/15 0215  BNP 2662.2*    IMAGING STUDIES Ct Abdomen Pelvis W Contrast  04/30/2015   CLINICAL DATA:  Abnormal labs. Elevated AST and ALT. Right upper quadrant mass. Nausea and vomiting for several weeks.  EXAM: CT ABDOMEN AND PELVIS WITH CONTRAST  TECHNIQUE: Multidetector CT imaging of the abdomen and pelvis was performed using the standard protocol following bolus administration of intravenous contrast.  CONTRAST:  100 mL Omnipaque 300 IV  COMPARISON:  None.  FINDINGS: Lower chest: Multi chamber cardiomegaly. Small right effusion. Adjacent ground-glass opacities in the right lower lobe. There is an apparent filling defect within the right lower lobe pulmonary  artery. Minimal linear atelectasis in the lingula.  Liver: Contrast refluxing into the IVC and hepatic veins. Liver appears normal in size. No evidence of focal hepatic lesion. No definite morphologic findings of cirrhosis. There is a small amount perihepatic ascites.  Hepatobiliary: Gallbladder physiologically distended, questionable gallbladder wall thickening. No calcified gallstone. No biliary dilatation, common bile duct measures 6 mm at the porta hepatis.  Pancreas:  Fatty atrophy of the head and uncinate process. No focal lesion. No ductal dilatation.  Spleen: Normal in size without focal lesion.  Adrenal glands: No nodule.  Kidneys: Symmetric renal enhancement. No hydronephrosis. No focal lesion.  Stomach/Bowel: Stomach physiologically distended. There are no dilated or thickened small bowel loops. Minimal thickening involving the ascending colon. Small volume of colonic stool. Distal diverticulosis without diverticulitis. The appendix is normal.  Vascular/Lymphatic: No retroperitoneal adenopathy. Abdominal aorta is normal in caliber. Moderate atherosclerosis of the abdominal aorta and its branches. No mesenteric or pelvic adenopathy.  Reproductive: Prostate gland is normal in size.  Bladder: Physiologically distended. Equivocal bladder wall thickening.  Other: Small amount of intra-abdominal ascites primarily in the perihepatic space, tracking in the right pericolic gutter and in the pelvis. No free air or loculated intra-abdominal fluid collection. Small fat containing umbilical hernia. There is mild whole body wall and mesenteric edema.  Musculoskeletal: There are no acute or suspicious osseous abnormalities. Degenerative change in the lumbar spine and at the pubic symphysis.  IMPRESSION: 1. No evidence of focal right upper quadrant or intra-abdominal mass. 2. Small right pleural effusion, small amount of intra-abdominal and pelvic ascites, and whole body wall edema. Findings may be related to liver dysfunction, however there are no CT morphologic findings of cirrhosis. There is multi chamber cardiomegaly, and right heart failure could produce a similar appearance. Suggestion of gallbladder wall thickening is likely related to systemic process. No biliary obstruction. 3. Incidental note of pulmonary embolus in a segmental right lower lobe pulmonary artery. 4. Colonic diverticulosis without diverticulitis and atherosclerosis. Critical Value/emergent results were called by  telephone at the time of interpretation on 04/30/2015 at 11:09 pm to PA JEFFREY HEDGES , who verbally acknowledged these results.   Electronically Signed   By: Rubye Oaks M.D.   On: 04/30/2015 23:10   Dg Sniff Test  05/06/2015   CLINICAL DATA:  Interrupted sleeping with gasping for air and snoring.  EXAM: CHEST FLUOROSCOPY -SNIFF TEST  TECHNIQUE: Real-time fluoroscopic evaluation of the chest was performed.  FLUOROSCOPY TIME:  0 MINUTES, 6 SECONDS  COMPARISON:  None.  FINDINGS: Small right pleural effusion with blunting of the right costophrenic angle. Mild cardiomegaly.  With sniff maneuver, both diaphragms move down in the expected fashion.  IMPRESSION: 1. Normal diaphragmatic motion with sniff maneuver; no findings of paradoxical movement to suggest diaphragmatic denervation. 2. Small right pleural effusion. 3. Mild cardiomegaly.   Electronically Signed   By: Gaylyn Rong M.D.   On: 05/06/2015 12:21   Dg Chest Port 1 View  05/01/2015   CLINICAL DATA:  Shortness of breath.  EXAM: PORTABLE CHEST - 1 VIEW  COMPARISON:  CT abdomen/pelvis 1 day prior.  FINDINGS: The heart is enlarged. Mild vascular congestion without overt edema. Minimal atelectasis at the left lung base. No confluent airspace disease. The small right pleural effusion on prior CT is not seen on this portable AP view. No pneumothorax. No acute osseous abnormality.  IMPRESSION: Cardiomegaly with mild vascular congestion.   Electronically Signed   By: Rubye Oaks M.D.   On: 05/01/2015  02:46   Mr Card Morphology Wo/w Cm  05/05/2015   CLINICAL DATA:  59 year old male with new diagnosis of non-ischemic cardiomyopathy. Evaluate for possible inflammatory and infiltrative diseases.  EXAM: CARDIAC MRI  TECHNIQUE: The patient was scanned on a 1.5 Tesla GE magnet. A dedicated cardiac coil was used. Functional imaging was done using Fiesta sequences. 2,3, and 4 chamber views were done to assess for RWMA's. Modified Simpson's rule using a  short axis stack was used to calculate an ejection fraction on a dedicated work [employer]. The patient received 23 cc of Multihance. After 10 minutes inversion recovery sequences were used to assess for infiltration and scar tissue.  CONTRAST:  23 cc  of Multihance  FINDINGS: 1. Severely dilated left ventricle with normal wall thickness and severely impaired systolic function (LVEF = 13%).  There is diffuse hypokinesis and paradoxical septal motion.  LVEDD:  76 mm  LVESD:  69 mm  LVEDV:  343 ml  LVESV:  298 ml  SV:  45 ml  CO:  3.5 L/minute  Myocardial mass 196 g  2. Severely dilated right ventricle with normal wall thickness and severely impaired systolic function (RVEF = 12%) with diffuse hypokinesis.  3. Severely dilated left atrium and moderately dilated right atrium.  4.  Severe mitral and moderate tricuspid regurgitation.  5. Normal size of the aortic root and ascending aorta. Dilated main pulmonary artery consistent with pulmonary hypertension.  6. There is diffuse mid wall late gadolinium enhancement in the interventricular septum, anterolateral walls and in the RV myocardium.  7.  No thrombus was seen in the left ventricle.  8. There is severe swirling of the blood in the right ventricle suspicious of the pre-thrombotic state.  IMPRESSION: 1. Severely dilated left and right ventricle with severe biventricular systolic impairment.  Diffuse mid wall late gadolinium enhancement in the myocardium of the left and right ventricle and in the RV attachment points to the LV consistent with dilated cardiomyopathy with biventricular involvement and decompensated heart failure.  There is no evidence of inflammatory or infiltrative cardiomyopathy.  2. Severely dilated left atrium and moderately dilated right atrium.  3. Severe mitral and moderate tricuspid regurgitation.  4. Dilated main pulmonary artery consistent with pulmonary hypertension. There is severe swirling of the blood in the right  ventricle suspicious of the pre-thrombotic state.  Tobias Alexander   Electronically Signed   By: Tobias Alexander   On: 05/05/2015 19:47    DISCHARGE EXAMINATION: Filed Vitals:   05/15/15 2135 05/16/15 0429 05/16/15 8937 05/16/15 0929  BP: 96/67 99/69 96/67  98/79  Pulse: 98 88 81 82  Temp: 98 F (36.7 C) 97.5 F (36.4 C)    TempSrc: Oral Oral    Resp: 18 18 18    Height:      Weight:  63.8 kg (140 lb 10.5 oz)    SpO2: 98% 100% 96%    General appearance: alert, cooperative, appears stated age and no distress Resp: clear to auscultation bilaterally Cardio: regular rate and rhythm, S1, S2 normal, systolic murmur, no click, rub or gallop GI: soft, non-tender; bowel sounds normal; no masses,  no organomegaly Extremities: extremities normal, atraumatic, no cyanosis or edema  DISPOSITION: Home  Discharge Instructions    Amb Referral to Cardiac Rehabilitation    Complete by:  As directed   Congestive Heart Failure: If diagnosis is Heart Failure, patient MUST meet each of the CMS criteria: 1. Left Ventricular Ejection Fraction </= 35% 2. NYHA class II-IV  symptoms despite being on optimal heart failure therapy for at least 6 weeks. 3. Stable = have not had a recent (<6 weeks) or planned (<6 months) major cardiovascular hospitalization or procedure  Program Details: - Physician supervised classes - 1-3 classes per week over a 12-18 week period, generally for a total of 36 sessions  Physician Certification: I certify that the above Cardiac Rehabilitation treatment is medically necessary and is medically approved by me for treatment of this patient. The patient is willing and cooperative, able to ambulate and medically stable to participate in exercise rehabilitation. The participant's progress and Individualized Treatment Plan will be reviewed by the Medical Director, Cardiac Rehab staff and as indicated by the Referring/Ordering Physician.  Diagnosis:  Heart Failure (see criteria below)   Heart Failure Type:  Chronic Systolic & Diastolic     Call MD for:  difficulty breathing, headache or visual disturbances    Complete by:  As directed      Call MD for:  extreme fatigue    Complete by:  As directed      Call MD for:  persistant dizziness or light-headedness    Complete by:  As directed      Call MD for:  persistant nausea and vomiting    Complete by:  As directed      Call MD for:  severe uncontrolled pain    Complete by:  As directed      Call MD for:  temperature >100.4    Complete by:  As directed      Diet - low sodium heart healthy    Complete by:  As directed      Discharge instructions    Complete by:  As directed   Please take your medications as prescribed. Cardiology will arrange follow up. Watch for bleeding and seek attention immediately if you notice any. Black colored stool are also suggestive of bleeding.  You were cared for by a hospitalist during your hospital stay. If you have any questions about your discharge medications or the care you received while you were in the hospital after you are discharged, you can call the unit and asked to speak with the hospitalist on call if the hospitalist that took care of you is not available. Once you are discharged, your primary care physician will handle any further medical issues. Please note that NO REFILLS for any discharge medications will be authorized once you are discharged, as it is imperative that you return to your primary care physician (or establish a relationship with a primary care physician if you do not have one) for your aftercare needs so that they can reassess your need for medications and monitor your lab values. If you do not have a primary care physician, you can call [phone] for a physician referral.     Increase activity slowly    Complete by:  As directed            ALLERGIES:  Allergies  Allergen Reactions  . Spironolactone Rash  . Aspirin     REACTION: facial swelling  . Dust Mite  Extract   . Other     Cats   . Peanuts [Peanut Oil]     sob      Discharge Medication List as of 05/16/2015 12:09 PM    START taking these medications   Details  apixaban (ELIQUIS) 5 MG TABS tablet Take 1 tablet (5 mg total) by mouth 2 (two) times daily., Starting 05/16/2015, Until Discontinued, Print  digoxin (LANOXIN) 0.25 MG tablet Take 1 tablet (0.25 mg total) by mouth daily., Starting 05/16/2015, Until Discontinued, Print    hydrocortisone 1 % ointment Apply topically 3 (three) times daily as needed for itching., Starting 05/16/2015, Until Discontinued, Print    losartan (COZAAR) 25 MG tablet Take 0.5 tablets (12.5 mg total) by mouth daily., Starting 05/16/2015, Until Discontinued, Print    magnesium oxide (MAG-OX) 400 (241.3 MG) MG tablet Take 0.5 tablets (200 mg total) by mouth daily., Starting 05/05/2015, Until Discontinued, Print      CONTINUE these medications which have CHANGED   Details  furosemide (LASIX) 40 MG tablet Take 0.5 tablets (20 mg total) by mouth every Monday, Wednesday, and Friday., Starting 05/16/2015, Until Discontinued, Print      CONTINUE these medications which have NOT CHANGED   Details  acetaminophen (TYLENOL) 500 MG tablet Take 500 mg by mouth every 6 (six) hours as needed (calm nerves)., Until Discontinued, Historical Med    ADVAIR DISKUS 250-50 MCG/DOSE AEPB TAKE 1 PUFF BY MOUTH TWICE A DAY, Historical Med    albuterol (PROVENTIL HFA;VENTOLIN HFA) 108 (90 BASE) MCG/ACT inhaler Inhale 2 puffs into the lungs every 4 (four) hours as needed for wheezing or shortness of breath., Starting 03/29/2015, Until Discontinued, Print    clonazePAM (KLONOPIN) 0.5 MG tablet TAKE 1 TABLET BY MOUTH EVERY EVENING AS DIRECTED, Print    gabapentin (NEURONTIN) 600 MG tablet Take 300 mg by mouth at bedtime. , Until Discontinued, Historical Med      STOP taking these medications     amphetamine-dextroamphetamine (ADDERALL) 10 MG tablet      methylphenidate (METADATE ER)  20 MG ER tablet      mupirocin ointment (BACTROBAN) 2 %      predniSONE (DELTASONE) 50 MG tablet        Follow-up Information    Follow up with [facility] Department of Social Services..   Why:  If you ned assistance with Disability   Contact information:   Address: [address] [place], [place] [address] [phone] Hours: Open today  8AM-5PM      Follow up with Social Security Administration.   Why:  If you need assistance with Disability.    Contact information:   Address: [address] [place], [place], [place] [address] Hours: Closing soon  9AM-4PM Phone: [phone]                         Phone: [phone]              Follow up with [facility].   Why:  [facility] will call 24-48 hrs after disch to set up appt after disch.   Contact information:   [address] [place] [address] [phone]       Follow up with [name], MD On 05/27/2015.   Specialty:  Cardiology   Why:  at 2:00 Garage Code [id number]   Contact information:   [address] [place] [address] [phone]       Follow up with [facility].   Why:  Registered Nurse   Contact information:   [address] [place] [address] [phone]       TOTAL DISCHARGE TIME: 35 minutes  [facility]  [facility] Pager [phone]  05/16/2015, 2:03 PM

## 2015-05-16 NOTE — Care Management Note (Signed)
Case Management Note  Patient Details  Name: Marcus Bates MRN: [id number] Date of Birth: [birth year]  Subjective/Objective:      Adm w heart failure              Action/Plan:lives alone, parent visiting from [facility]   Expected Discharge Date:                 Expected Discharge Plan:  Home w Home Health Services  In-House Referral:     Discharge planning Services  CM Consult  Post Acute Care Choice:  Home Health Choice offered to:  Patient  DME Arranged:    DME Agency:     HH Arranged:  RN, Disease Management HH Agency:  [facility]  Status of Service:  Completed, signed off  Medicare Important Message Given:    Date Medicare IM Given:    Medicare IM give by:    Date Additional Medicare IM Given:    Additional Medicare Important Message give by:     If discussed at Long Length of Stay Meetings, dates discussed:    05/16/2015 Marcus Blend, RN, BSN [phone] CM assessed pt, parents at bedside.  Pt will discharge home alone with support of family/friends close by in case of a need.  CM contacted [facility] to verify referral acceptance and to inform that pt will discharge home today.  CM was informed by parents that they have already arranged for private duty nurse to help pt post discharge for 3 days a week with light household duties.  CM confirmed that pt does still have Eliquis cards provided 05/15/15, pt made aware of cost post discount copay card expires.  Pharmacy of choice was contacted and informed CM that medication is available for pick up.     Additional Comments: parent helping pt to hire help for cleaning -meals after disch. Pt would like hhrn for chf follow up. Went over lists and no pref . Ref to donna w [facility] for hhrn.  Marcus Parr, RN 05/16/2015, 10:58 AM

## 2015-05-16 NOTE — Progress Notes (Signed)
Discharge Note:  Patient alert and oriented X 4 and in no distress.  Telemetry and peripheral IV discontinued.  PICC line removed by IV team RN and the site was clean is clean and dry.  Patient given discharge instructions regarding CHF management, medications, upcoming appointments, diet, and activity.  Patient and his parents verbalized understanding of all discharge instructions.  Patient confirmed that he has all of his personal belongings.  Patient taken out via wheelchair by hospital volunteer.

## 2015-05-16 NOTE — Progress Notes (Signed)
Pt had a 15 beat run of v-tach at 0622 on 05/16/15. Pt is alert, oriented, and sleeping comfortably.  When I woke pt up he denied any chest pains or feeling abnormal. Vital signs are stable. K.Schorr has been notified and awaiting orders.

## 2015-05-16 NOTE — Progress Notes (Signed)
Pt walked around until 1500 feet. No distress noted. Pt was able to carry on a conversation with no distress while walking.

## 2015-05-16 NOTE — Progress Notes (Signed)
Patient ID: Marcus Bates, male   DOB: 01-Dec-1955, 59 y.o.   MRN: [id number] Advanced Heart Failure Rounding Note   Subjective:     8/31 Cath lab for RHC/swan placement--> cardiogenic shock. Started on milrinone 0.25 mcg and diuresed with IV lasix. 9/1/ Milrinone 0.25 mcg. Started on sprio and losartan but was hypotensive. Given 250 cc NS. CO-OX 76%.   9/4 off milrinone .  9/5 CO-OX 67% spiro stopped due to rash 9/6 Received prednisone for drug rash ? Cleda Daub versus coumadin. Able to walk 1050 feet 9/7 Transferred to tele.   Rash resolving.  Feels ok today, no dyspnea walking in halls.  Labs stable.   RHC 8/31 RA = 9 RV = 39/5/12 PA = 41/20 (28) PCW = 18 Fick cardiac output/index = 2.6/1.4 Therms cardiac output/index = 3.6/2.0 PVR = 3.8 WU FA sat = 98% PA sat = 54%, 57%  8/25 ECHO EF 10% RV dilated  8/25 + Acute DVT distal to mild popliteal vein RLE No DVT on Left  8/26 LHC -  1. Prox RCA to Dist RCA lesion, 20% stenosed. 2. Ost Cx to Dist Cx lesion, 20% stenosed. 3. Mid LAD lesion, 25% stenosed.   FH: Father --> CHF due to ischemic CM  Objective:   Weight Range:  Vital Signs:   Temp:  [97.5 F (36.4 C)-98 F (36.7 C)] 97.5 F (36.4 C) (09/09 0429) Pulse Rate:  [81-98] 81 (09/09 0633) Resp:  [18] 18 (09/09 1165) BP: (96-103)/(67-70) 96/67 mmHg (09/09 0633) SpO2:  [96 %-100 %] 96 % (09/09 7903) Weight:  [140 lb 10.5 oz (63.8 kg)] 140 lb 10.5 oz (63.8 kg) (09/09 0429) Last BM Date: 05/14/15  Weight change: Filed Weights   05/14/15 0415 05/15/15 0430 05/16/15 0429  Weight: 143 lb 3.2 oz (64.955 kg) 142 lb 3.2 oz (64.5 kg) 140 lb 10.5 oz (63.8 kg)    Intake/Output:   Intake/Output Summary (Last 24 hours) at 05/16/15 0858 Last data filed at 05/16/15 0430  Gross per 24 hour  Intake    480 ml  Output   1925 ml  Net  -1445 ml     Physical Exam: General: In  bed. NAD  HEENT: normal  Neck: supple. No jvd.  Carotids 2+ bilat; no bruits. No lymphadenopathy or  thryomegaly appreciated. Cor: PMI nondisplaced. Regular rate & rhythm. No rubs, or murmurs. No S3.   Lungs: clear Abdomen: soft, nontender, non- distended. No hepatosplenomegaly. No bruits or masses. Good bowel sounds. Extremities: no cyanosis, clubbing, rash,no edema. Warm Neuro: alert & orientedx3, cranial nerves grossly intact. moves all 4 extremities w/o difficulty. Affect pleasant Skin: Diffuse maculopapular rash on trunk/arms/legs.  Telemetry: SR 80s   Labs: Basic Metabolic Panel:  Recent Labs Lab 05/12/15 0505 05/13/15 0445 05/14/15 0506 05/15/15 0430 05/16/15 0521  NA 136 138 135 137 137  K 4.5 4.4 4.2 4.5 4.3  CL 101 103 100* 102 101  CO2 27 28 28 29 30   GLUCOSE 80 80 103* 83 80  BUN 14 14 17 19 19   CREATININE 0.85 0.90 0.94 0.90 0.90  CALCIUM 8.8* 9.2 9.2 9.1 9.2    Liver Function Tests: No results for input(s): AST, ALT, ALKPHOS, BILITOT, PROT, ALBUMIN in the last 168 hours. No results for input(s): LIPASE, AMYLASE in the last 168 hours. No results for input(s): AMMONIA in the last 168 hours.  CBC:  Recent Labs Lab 05/12/15 0505 05/13/15 0445 05/14/15 0506 05/15/15 0430 05/16/15 0521  WBC 4.4 4.7 5.3 5.3 5.0  HGB 16.0 16.6 15.2 15.8 15.7  HCT 48.4 49.2 46.5 48.0 47.3  MCV 91.7 92.3 91.5 92.1 92.2  PLT 147* 137* 159 173 147*    Cardiac Enzymes: No results for input(s): CKTOTAL, CKMB, CKMBINDEX, TROPONINI in the last 168 hours.  BNP: BNP (last 3 results)  Recent Labs  05/01/15 0215  BNP 2662.2*    ProBNP (last 3 results) No results for input(s): PROBNP in the last 8760 hours.    Other results:  Imaging: No results found.   Medications:     Scheduled Medications: . apixaban  5 mg Oral BID  . clonazePAM  1 mg Oral QHS  . clonazePAM  1 mg Oral Once  . digoxin  0.25 mg Oral Daily  . gabapentin  300 mg Oral QHS  . losartan  12.5 mg Oral Daily  . magnesium oxide  200 mg Oral Daily  . sodium chloride  10-40 mL Intracatheter Q12H     Infusions:    PRN Medications: acetaminophen, albuterol, hydrocortisone, ondansetron (ZOFRAN) IV, oxyCODONE-acetaminophen, sodium chloride   Assessment:  1. A/C Biventricular HF 2. Acute DVT RLE 3. Acute PE- RLL noted on CT 4. Severe MR 5. NSVT 6. Cardiogenic Shock  7. Drug rash  Plan/Discussion:    Repeat 05/14/15 ECHO EF 20-25% RV moderately dilated.  Cardiac MRI without definitive evidence for infiltrative disease or myocarditis.   Stable off milrinone. No spiro with rash. Continue digoxin and low dose losartan. BP soft unable to titrate meds. No bb with low output. Renal function stable.  Would plan on using Lasix 20 mg M/W/F and prn.   He is VERY reluctant about considering home inotropes or VAD therapies.   ? Rash spiro versus coumadin.  Received one dose of prednisone 9/6  Stopped coumadin/heparin due to rash => now on apixaban.   He can go home today.  Will need followup in CHF clinic within 2 wks.  Home meds: Lasix 20 mg qM/W/F and prn, losartan 12.5 mg daily, digoxin 0.25 daily, apixaban 5 mg bid.   Marca Ancona 05/16/2015 9:02 AM

## 2015-05-16 NOTE — Progress Notes (Signed)
Pt up walking in hall with NT. Looks good. Pt able to perform teach back on basics of controlling Hf. He asked about fluids so we discussed this and I gave him a fluid handout. Voiced understanding. [phone] Ethelda Chick CES, ACSM 10:28 AM 05/16/2015

## 2015-05-22 ENCOUNTER — Encounter (HOSPITAL_COMMUNITY): Payer: Self-pay

## 2015-05-22 ENCOUNTER — Ambulatory Visit (HOSPITAL_COMMUNITY)
Admit: 2015-05-22 | Discharge: 2015-05-22 | Disposition: A | Discharge status: Home / Self Care | Payer: BLUE CROSS/BLUE SHIELD | Source: Ambulatory Visit | Attending: Internal Medicine | Admitting: Internal Medicine

## 2015-05-22 VITALS — BP 84/50 | HR 98 | Wt 142.8 lb

## 2015-05-22 DIAGNOSIS — I2510 Atherosclerotic heart disease of native coronary artery without angina pectoris: Secondary | ICD-10-CM | POA: Diagnosis not present

## 2015-05-22 DIAGNOSIS — I5022 Chronic systolic (congestive) heart failure: Secondary | ICD-10-CM | POA: Insufficient documentation

## 2015-05-22 DIAGNOSIS — I213 ST elevation (STEMI) myocardial infarction of unspecified site: Secondary | ICD-10-CM

## 2015-05-22 DIAGNOSIS — I2699 Other pulmonary embolism without acute cor pulmonale: Secondary | ICD-10-CM

## 2015-05-22 DIAGNOSIS — J45909 Unspecified asthma, uncomplicated: Secondary | ICD-10-CM | POA: Diagnosis not present

## 2015-05-22 DIAGNOSIS — F329 Major depressive disorder, single episode, unspecified: Secondary | ICD-10-CM | POA: Diagnosis not present

## 2015-05-22 DIAGNOSIS — Z7902 Long term (current) use of antithrombotics/antiplatelets: Secondary | ICD-10-CM | POA: Insufficient documentation

## 2015-05-22 DIAGNOSIS — I480 Paroxysmal atrial fibrillation: Secondary | ICD-10-CM | POA: Insufficient documentation

## 2015-05-22 DIAGNOSIS — Z86711 Personal history of pulmonary embolism: Secondary | ICD-10-CM | POA: Diagnosis not present

## 2015-05-22 DIAGNOSIS — Z86718 Personal history of other venous thrombosis and embolism: Secondary | ICD-10-CM | POA: Insufficient documentation

## 2015-05-22 DIAGNOSIS — I428 Other cardiomyopathies: Secondary | ICD-10-CM | POA: Insufficient documentation

## 2015-05-22 DIAGNOSIS — R0602 Shortness of breath: Secondary | ICD-10-CM | POA: Diagnosis present

## 2015-05-22 DIAGNOSIS — I5041 Acute combined systolic (congestive) and diastolic (congestive) heart failure: Secondary | ICD-10-CM | POA: Diagnosis not present

## 2015-05-22 DIAGNOSIS — Z79899 Other long term (current) drug therapy: Secondary | ICD-10-CM | POA: Diagnosis not present

## 2015-05-22 DIAGNOSIS — F419 Anxiety disorder, unspecified: Secondary | ICD-10-CM | POA: Diagnosis not present

## 2015-05-22 DIAGNOSIS — I513 Intracardiac thrombosis, not elsewhere classified: Secondary | ICD-10-CM

## 2015-05-22 DIAGNOSIS — G2581 Restless legs syndrome: Secondary | ICD-10-CM | POA: Diagnosis not present

## 2015-05-22 LAB — BASIC METABOLIC PANEL
ANION GAP: 5 (ref 5–15)
BUN: 19 mg/dL (ref 6–20)
CHLORIDE: 101 mmol/L (ref 101–111)
CO2: 32 mmol/L (ref 22–32)
Calcium: 9.5 mg/dL (ref 8.9–10.3)
Creatinine, Ser: 1.08 mg/dL (ref 0.61–1.24)
GFR calc non Af Amer: >60 mL/min (ref >60)
Glucose, Bld: 71 mg/dL (ref 65–99)
POTASSIUM: 5 mmol/L (ref 3.5–5.1)
SODIUM: 138 mmol/L (ref 135–145)

## 2015-05-22 LAB — BRAIN NATRIURETIC PEPTIDE: B NATRIURETIC PEPTIDE 5: 711.9 pg/mL — AB (ref 0.0–100.0)

## 2015-05-22 MED ORDER — IVABRADINE HCL 5 MG PO TABS: 5.0000 mg | ORAL_TABLET | Freq: Two times a day (BID) | ORAL | Status: DC

## 2015-05-22 NOTE — Progress Notes (Signed)
ADVANCED HF CLINIC NOTE  Patient ID: Marcus Bates, male   DOB: [birth year], 59 y.o.   MRN: [id number]   PCP: Marcus Bates Primary Cardiologist: Marcus Bates  HPI:  Marcus Bates is a 59 year old male with no significant PMHx who was admitted in August 2016 for acute systolic HF complicated by cardiogenic shock and RLL PE.  Echo showed EF 10-15% with grade 3 diastolic dysfunction and LV mural thrombus. Severe RV dysfunction.  Cardiac cath 05/02/15 showed minimal nonobstructive CAD. RHC cath on 8/31 c/w low output. Was treated with milrinone which was eventually weaned off. HF med titration limited by low BP.  He developed a drug rash in the hospital. Initially thought to be due to spironolactone but persisted and then felt to be related to warfarin. Now on Eliquis. Discharge weight 141 pounds. Takes lasix 20 mg M/W?F  Returns for f/u. Feeling much better. Has some good days and bad days.. Able to do all ADLs without a problem. Feels a little drowsy after taking his medications on occasion. Able to go grocery shopping without problems. No edema, orthopnea or PND.   Rash resolved.   RHC 05/07/2015 RA = 9 RV = 39/5/12 PA = 41/20 (28) PCW = 18 Fick cardiac output/index = 2.6/1.4 Therms cardiac output/index = 3.6/2.0 PVR = 3.8 WU FA sat = 98% PA sat = 54%, 57    ROS: All systems negative except as listed in HPI, PMH and Problem List.  SH:  Social History   Social History  . Marital Status: Single    Spouse Name: N/A  . Number of Children: N/A  . Years of Education: N/A   Occupational History  .      Tools   Social History Main Topics  . Smoking status: Never Smoker   . Smokeless tobacco: Never Used  . Alcohol Use: No  . Drug Use: No  . Sexual Activity: Not on file   Other Topics Concern  . Not on file   Social History Narrative    FH:  Family History  Problem Relation Age of Onset  . Heart disease Other   . Pulmonary embolism Neg Hx   . CAD Maternal Grandfather     MI in  his 55s maybe?  . Stroke Paternal Grandfather   . CAD Maternal Aunt     bypass at 52    Past Medical History  Diagnosis Date  . Asthma   . Anxiety   . Depression   . Insomnia   . Restless leg syndrome   . Severe mitral insufficiency     a. 04/2015 Echo: Sev MR.  Marland Kitchen NICM (nonischemic cardiomyopathy)     a. 04/2015 Echo: EF 10-15%, sev diff HK.  . Chronic combined systolic and diastolic CHF (congestive heart failure)     a. 04/2015 Echo: EF 10-15%, sev diff HK, Gr 3 DD, sev MR, sev dil LA, dilated RV, mod dil RA.  . Non-obstructive CAD     a. 04/2015 Cath: LM nl, LAD 45m, LCX 20ost, RCA 20d.  . Acute deep vein thrombosis (DVT) of popliteal vein of right lower extremity     a. 04/2015 -> Xarelto  . Pulmonary embolism on right     a. 04/2015 -> Xarelto  . NSVT (nonsustained ventricular tachycardia)   . LV (left ventricular) mural thrombus     a. 04/2015 Echo: possible laminated echodense area on inferior wall, cannot exclude laminated thrombus.    Current Outpatient Prescriptions  Medication Sig Dispense Refill  .  acetaminophen (TYLENOL) 500 MG tablet Take 500 mg by mouth every 6 (six) hours as needed (calm nerves).    Marland Kitchen albuterol (PROVENTIL HFA;VENTOLIN HFA) 108 (90 BASE) MCG/ACT inhaler Inhale 2 puffs into the lungs every 4 (four) hours as needed for wheezing or shortness of breath. 1 Inhaler 2  . apixaban (ELIQUIS) 5 MG TABS tablet Take 1 tablet (5 mg total) by mouth 2 (two) times daily. 60 tablet 2  . clonazePAM (KLONOPIN) 0.5 MG tablet Take 0.5 mg by mouth at bedtime.    . digoxin (LANOXIN) 0.25 MG tablet Take 1 tablet (0.25 mg total) by mouth daily. 30 tablet 1  . Fluticasone-Salmeterol (ADVAIR) 250-50 MCG/DOSE AEPB Inhale 1 puff into the lungs 2 (two) times daily as needed (SOB, wheezing).    . furosemide (LASIX) 40 MG tablet Take 0.5 tablets (20 mg total) by mouth every Monday, Wednesday, and Friday. 30 tablet 1  . gabapentin (NEURONTIN) 600 MG tablet Take 300 mg by mouth at  bedtime.     . hydrocortisone 1 % ointment Apply topically 3 (three) times daily as needed for itching. 30 g 0  . iron polysaccharides (NIFEREX) 150 MG capsule Take 150 mg by mouth daily.    Marland Kitchen losartan (COZAAR) 25 MG tablet Take 0.5 tablets (12.5 mg total) by mouth daily. 30 tablet 0  . magnesium oxide (MAG-OX) 400 (241.3 MG) MG tablet Take 0.5 tablets (200 mg total) by mouth daily. 30 tablet 0  . Multiple Vitamins-Minerals (MULTIVITAMIN WITH MINERALS) tablet Take 1 tablet by mouth daily.    . vitamin A 16109 UNIT capsule Take 25,000 Units by mouth daily.    . vitamin E 100 UNIT capsule Take 100 Units by mouth 3 (three) times a week.     No current facility-administered medications for this encounter.    Filed Vitals:   05/22/15 1405  BP: 84/50  Pulse: 98  Weight: 142 lb 12.8 oz (64.774 kg)  SpO2: 95%    PHYSICAL EXAM:  General:  Well appearing. No resp difficulty HEENT: normal Neck: supple. JVP 6. Carotids 2+ bilaterally; no bruits. No lymphadenopathy or thryomegaly appreciated. Cor: PMI laterally displaced Regular rate & rhythm. +s3 Lungs: clear Abdomen: soft, nontender, nondistended. No hepatosplenomegaly. No bruits or masses. Good bowel sounds. Extremities: no cyanosis, clubbing, rash, edema Neuro: alert & orientedx3, cranial nerves grossly intact. Moves all 4 extremities w/o difficulty. Affect pleasant.    ASSESSMENT & PLAN: 1. Chronic systolic HF with severe biventricular dysfunction EF 10-15% due to NICM. Newly diagnosed 8/16 2. RLL pulmonary embolism  3. PAF  4. Warfarin and ASA allergies  5. Mild nonobstructive CAD by cath 04/2015 6. Severe MR 7. LV mural thrombus  He is improved but still quite tenuous with low BP and prominent S3 on exam. Volume status ok. (ReDS 37%). HF med titration limited by low BP. Will start corlanor  bid. Stressed nedd to contact us with any deterioration in symptoms. We will follow closely.   Marcus Bates, Daniel,MD 2:56 PM

## 2015-05-22 NOTE — Progress Notes (Signed)
Medication Samples have been provided to the patient.  Drug name: Corlanor  Qty: 4  LOT: [id number]  Exp.Date: 9/17  The patient has been instructed regarding the correct time, dose, and frequency of taking this medication, including desired effects and most common side effects.   Shley Dolby 3:22 PM 05/22/2015

## 2015-05-22 NOTE — Patient Instructions (Signed)
Start Corlanor 5 mg Twice daily   Labs today  Your physician recommends that you schedule a follow-up appointment in: 3 weeks with Dr Gala Romney

## 2015-05-22 NOTE — Progress Notes (Signed)
Advanced Heart Failure Medication Review by a Pharmacist  Does the patient  feel that his/her medications are working for him/her?  yes  Has the patient been experiencing any side effects to the medications prescribed?  no  Does the patient measure his/her own blood pressure or blood glucose at home?  yes   Does the patient have any problems obtaining medications due to transportation or finances?   no  Understanding of regimen: good Understanding of indications: good Potential of compliance: good    Pharmacist comments:  Marcus Bates is a pleasant and inquisitive 59 yo M presenting with an up-to-date medication list and his medication bottles. He reports excellent compliance with all of his medications and takes multiple vitamins to help with anxiety and dry skin. I did recommend not taking the vitamin E as it may lead to vitamin K deficiency with can potentially increase his risk for bleeding especially with concomitant use of Eliquis. He did not have any other medication-related questions or concerns for me at this time.   Tyler Deis. Bonnye Fava, PharmD, BCPS, CPP Clinical Pharmacist Pager: [phone] Phone: [phone] 05/22/2015 2:55 PM

## 2015-06-02 ENCOUNTER — Telehealth (HOSPITAL_COMMUNITY): Payer: Self-pay | Admitting: *Deleted

## 2015-06-02 NOTE — Telephone Encounter (Signed)
Darlene called to let us know that she is the pt's assigned RN case manager and if we need anything for pt we can contact her.

## 2015-06-05 ENCOUNTER — Telehealth (HOSPITAL_COMMUNITY): Payer: Self-pay | Admitting: *Deleted

## 2015-06-05 NOTE — Telephone Encounter (Signed)
Received signed MD order for cardiac rehab.  Called pt to assess readiness to attend cardiac rehab.  Pt remarked that he noticed he has a "squeezing sensation" in his chest similar to the way the arm feels when a blood pressure cuff is applied.  Pt wonders could it be the new medication he is taking - corlanor.  Pt had not called the office to advise anyone of his symptoms.  Pt denies any other symptoms. Pt states the symptoms remain constant with no change based upon activity.  Heart failure clinic called and spoke to [facility], triage nurse regarding pt symptoms.  Heart failure clinic to contact pt for follow up. Alanson Aly, BSN

## 2015-06-06 ENCOUNTER — Telehealth (HOSPITAL_COMMUNITY): Payer: Self-pay | Admitting: *Deleted

## 2015-06-06 NOTE — Telephone Encounter (Signed)
[facility] pharmacy aware.

## 2015-06-06 NOTE — Telephone Encounter (Signed)
Initial PA for pt's Corlanor was denied, completed appeal and med was approved through [facility] from 05/29/15-09/05/38, ref # [id number]

## 2015-06-13 ENCOUNTER — Ambulatory Visit (HOSPITAL_COMMUNITY)
Admission: RE | Admit: 2015-06-13 | Discharge: 2015-06-13 | Disposition: A | Discharge status: Home / Self Care | Payer: BLUE CROSS/BLUE SHIELD | Source: Ambulatory Visit | Attending: Internal Medicine | Admitting: Internal Medicine

## 2015-06-13 VITALS — BP 100/58 | HR 75 | Wt 147.5 lb

## 2015-06-13 DIAGNOSIS — G2581 Restless legs syndrome: Secondary | ICD-10-CM | POA: Insufficient documentation

## 2015-06-13 DIAGNOSIS — I428 Other cardiomyopathies: Secondary | ICD-10-CM

## 2015-06-13 DIAGNOSIS — F329 Major depressive disorder, single episode, unspecified: Secondary | ICD-10-CM | POA: Insufficient documentation

## 2015-06-13 DIAGNOSIS — J45909 Unspecified asthma, uncomplicated: Secondary | ICD-10-CM | POA: Diagnosis not present

## 2015-06-13 DIAGNOSIS — Z7902 Long term (current) use of antithrombotics/antiplatelets: Secondary | ICD-10-CM | POA: Diagnosis not present

## 2015-06-13 DIAGNOSIS — I480 Paroxysmal atrial fibrillation: Secondary | ICD-10-CM | POA: Diagnosis not present

## 2015-06-13 DIAGNOSIS — I5022 Chronic systolic (congestive) heart failure: Secondary | ICD-10-CM | POA: Insufficient documentation

## 2015-06-13 DIAGNOSIS — Z79899 Other long term (current) drug therapy: Secondary | ICD-10-CM | POA: Insufficient documentation

## 2015-06-13 DIAGNOSIS — I513 Intracardiac thrombosis, not elsewhere classified: Secondary | ICD-10-CM

## 2015-06-13 DIAGNOSIS — I5041 Acute combined systolic (congestive) and diastolic (congestive) heart failure: Secondary | ICD-10-CM

## 2015-06-13 DIAGNOSIS — I2510 Atherosclerotic heart disease of native coronary artery without angina pectoris: Secondary | ICD-10-CM | POA: Insufficient documentation

## 2015-06-13 DIAGNOSIS — F419 Anxiety disorder, unspecified: Secondary | ICD-10-CM | POA: Insufficient documentation

## 2015-06-13 DIAGNOSIS — Z86718 Personal history of other venous thrombosis and embolism: Secondary | ICD-10-CM | POA: Diagnosis not present

## 2015-06-13 DIAGNOSIS — Z86711 Personal history of pulmonary embolism: Secondary | ICD-10-CM | POA: Insufficient documentation

## 2015-06-13 LAB — BRAIN NATRIURETIC PEPTIDE: B Natriuretic Peptide: 804.5 pg/mL — ABNORMAL HIGH (ref 0.0–100.0)

## 2015-06-13 LAB — BASIC METABOLIC PANEL
ANION GAP: 10 (ref 5–15)
BUN: 13 mg/dL (ref 6–20)
CALCIUM: 9.5 mg/dL (ref 8.9–10.3)
CO2: 27 mmol/L (ref 22–32)
CREATININE: 0.91 mg/dL (ref 0.61–1.24)
Chloride: 100 mmol/L — ABNORMAL LOW (ref 101–111)
Glucose, Bld: 104 mg/dL — ABNORMAL HIGH (ref 65–99)
Potassium: 4.4 mmol/L (ref 3.5–5.1)
SODIUM: 137 mmol/L (ref 135–145)

## 2015-06-13 MED ORDER — APIXABAN 5 MG PO TABS: 5.0000 mg | ORAL_TABLET | Freq: Two times a day (BID) | ORAL | Status: DC

## 2015-06-13 MED ORDER — LOSARTAN POTASSIUM 25 MG PO TABS: 25.0000 mg | ORAL_TABLET | Freq: Every day | ORAL | Status: DC

## 2015-06-13 NOTE — Progress Notes (Signed)
Advanced Heart Failure Medication Review by a Pharmacist  Does the patient  feel that his/her medications are working for him/her?  yes  Has the patient been experiencing any side effects to the medications prescribed?  no  Does the patient measure his/her own blood pressure or blood glucose at home?  no   Does the patient have any problems obtaining medications due to transportation or finances?   no  Understanding of regimen: good Understanding of indications: good Potential of compliance: good Patient understands to avoid NSAIDs. Patient understands to avoid decongestants.  Issues to address at subsequent visits: None   Pharmacist comments:  Mr. Zephir is a pleasant 59 yo M presenting without a medication list but able to verbalize most of his medications to me. He reports good compliance with his medications. He did not have any specific medication-related questions or concerns for me at this time.   Tyler Deis. Bonnye Fava, PharmD, BCPS, CPP Clinical Pharmacist Pager: [phone] Phone: [phone] 06/13/2015 3:35 PM    Time with patient: 6 minutes Preparation and documentation time: 2 minutes Total time: 8 minutes

## 2015-06-13 NOTE — Patient Instructions (Signed)
Increase Losartan to 25 mg (1 tab) daily  Labs today  Your physician recommends that you schedule a follow-up appointment in: 2-3 weeks

## 2015-06-13 NOTE — Progress Notes (Signed)
ADVANCED HF CLINIC NOTE  Patient ID: Marcus Bates, male   DOB: 06/01/1956, 59 y.o.   MRN: [id number]   PCP: Marcus Bates Primary Cardiologist: Marcus Bates  HPI:  Marcus Bates is a 59 year old male with no significant PMHx who was admitted in August 2016 for acute systolic HF complicated by cardiogenic shock and RLL PE.  Echo showed EF 10-15% with grade 3 diastolic dysfunction and LV mural thrombus. Severe RV dysfunction.  Cardiac cath 05/02/15 showed minimal nonobstructive CAD. RHC cath on 8/31 c/w low output. Was treated with milrinone which was eventually weaned off. HF med titration limited by low BP.  He developed a drug rash in the hospital. Initially thought to be due to spironolactone but persisted and then felt to be related to warfarin. Now on Eliquis. Discharge weight 141 pounds. Takes lasix 20 mg M/W?F  Returns for f/u. At last visit started on Corlanor. Had chest pain for a week or two but now better. Feels like he is getting stronger. Denies dyspnea. Was able to walk a mile a few days ago.   No edema, orthopnea or PND.   Still feels a bit wobbly. Weight unchanged.   RHC 05/07/2015 RA = 9 RV = 39/5/12 PA = 41/20 (28) PCW = 18 Fick cardiac output/index = 2.6/1.4 Therms cardiac output/index = 3.6/2.0 PVR = 3.8 WU FA sat = 98% PA sat = 54%, 57    ROS: All systems negative except as listed in HPI, PMH and Problem List.  SH:  Social History   Social History  . Marital Status: Single    Spouse Name: N/A  . Number of Children: N/A  . Years of Education: N/A   Occupational History  .      Tools   Social History Main Topics  . Smoking status: Never Smoker   . Smokeless tobacco: Never Used  . Alcohol Use: No  . Drug Use: No  . Sexual Activity: Not on file   Other Topics Concern  . Not on file   Social History Narrative    FH:  Family History  Problem Relation Age of Onset  . Heart disease Other   . Pulmonary embolism Neg Hx   . CAD Maternal Grandfather     MI in  his 34s maybe?  . Stroke Paternal Grandfather   . CAD Maternal Aunt     bypass at 46    Past Medical History  Diagnosis Date  . Asthma   . Anxiety   . Depression   . Insomnia   . Restless leg syndrome   . Severe mitral insufficiency     a. 04/2015 Echo: Sev MR.  Marland Kitchen NICM (nonischemic cardiomyopathy)     a. 04/2015 Echo: EF 10-15%, sev diff HK.  . Chronic combined systolic and diastolic CHF (congestive heart failure)     a. 04/2015 Echo: EF 10-15%, sev diff HK, Gr 3 DD, sev MR, sev dil LA, dilated RV, mod dil RA.  . Non-obstructive CAD     a. 04/2015 Cath: LM nl, LAD 59m, LCX 20ost, RCA 20d.  . Acute deep vein thrombosis (DVT) of popliteal vein of right lower extremity     a. 04/2015 -> Xarelto  . Pulmonary embolism on right     a. 04/2015 -> Xarelto  . NSVT (nonsustained ventricular tachycardia)   . LV (left ventricular) mural thrombus     a. 04/2015 Echo: possible laminated echodense area on inferior wall, cannot exclude laminated thrombus.    Current Outpatient Prescriptions  Medication Sig Dispense Refill  . acetaminophen (TYLENOL) 500 MG tablet Take 500 mg by mouth every 6 (six) hours as needed (calm nerves).    Marland Kitchen albuterol (PROVENTIL HFA;VENTOLIN HFA) 108 (90 BASE) MCG/ACT inhaler Inhale 2 puffs into the lungs every 4 (four) hours as needed for wheezing or shortness of breath. 1 Inhaler 2  . amphetamine-dextroamphetamine (ADDERALL) 10 MG tablet Take 1 tablet by mouth 2 (two) times daily.  0  . apixaban (ELIQUIS) 5 MG TABS tablet Take 1 tablet (5 mg total) by mouth 2 (two) times daily. 60 tablet 2  . ARIPiprazole (ABILIFY) 5 MG tablet Take 5 mg by mouth daily.    . clonazePAM (KLONOPIN) 0.5 MG tablet Take 0.5 mg by mouth at bedtime.    . digoxin (LANOXIN) 0.25 MG tablet Take 1 tablet (0.25 mg total) by mouth daily. 30 tablet 1  . Fluticasone-Salmeterol (ADVAIR) 250-50 MCG/DOSE AEPB Inhale 1 puff into the lungs 2 (two) times daily as needed (SOB, wheezing).    . furosemide (LASIX)  40 MG tablet Take 0.5 tablets (20 mg total) by mouth every Monday, Wednesday, and Friday. 30 tablet 1  . gabapentin (NEURONTIN) 600 MG tablet Take 300 mg by mouth at bedtime.     . iron polysaccharides (NIFEREX) 150 MG capsule Take 150 mg by mouth daily.    . ivabradine (CORLANOR) 5 MG TABS tablet Take 1 tablet (5 mg total) by mouth 2 (two) times daily with a meal. 60 tablet 6  . losartan (COZAAR) 25 MG tablet Take 0.5 tablets (12.5 mg total) by mouth daily. 30 tablet 0  . magnesium oxide (MAG-OX) 400 MG tablet Take 100 mg by mouth daily.    . Multiple Vitamins-Minerals (MULTIVITAMIN WITH MINERALS) tablet Take 1 tablet by mouth daily.    . vitamin A 16109 UNIT capsule Take 25,000 Units by mouth 4 (four) times a week.     . vitamin E 100 UNIT capsule Take 100 Units by mouth 3 (three) times a week.     No current facility-administered medications for this encounter.    Filed Vitals:   06/13/15 1520  BP: 100/58  Pulse: 75  Weight: 147 lb 8 oz (66.906 kg)  SpO2: 100%    PHYSICAL EXAM:  General:  Well appearing. No resp difficulty HEENT: normal Neck: supple. JVP 5. Carotids 2+ bilaterally; no bruits. No lymphadenopathy or thryomegaly appreciated. Cor: PMI laterally displaced Regular rate & rhythm. +s3 occasional ectopy  Lungs: clear Abdomen: soft, nontender, nondistended. No hepatosplenomegaly. No bruits or masses. Good bowel sounds. Extremities: no cyanosis, clubbing, rash, edema Neuro: alert & orientedx3, cranial nerves grossly intact. Moves all 4 extremities w/o difficulty. Affect pleasant.    ASSESSMENT & PLAN: 1. Chronic systolic HF with severe biventricular dysfunction EF 10-15% due to NICM. Newly diagnosed 8/16 2. RLL pulmonary embolism  3. PAF  4. Warfarin and ASA allergies  5. Mild nonobstructive CAD by cath 04/2015 6. Severe MR 7. LV mural thrombus  He is improved but still somewhat tenuous. I think Corlanor has helped some. Will increase losartan to  daily. RTC in  2 weeks. May attempt carvedilol at that time.    Marcus Bates, Daniel,MD 4:14 PM

## 2015-06-13 NOTE — Addendum Note (Signed)
Encounter addended by: Noralee Space, RN on: 06/13/2015  4:42 PM<BR>     Documentation filed: Orders

## 2015-06-13 NOTE — Addendum Note (Signed)
Encounter addended by: Noralee Space, RN on: 06/13/2015  4:33 PM<BR>     Documentation filed: Visit Diagnoses, Dx Association, Patient Instructions Section, Orders

## 2015-07-03 ENCOUNTER — Encounter (HOSPITAL_COMMUNITY): Payer: Self-pay | Admitting: Internal Medicine

## 2015-07-03 ENCOUNTER — Ambulatory Visit (HOSPITAL_COMMUNITY)
Admission: RE | Admit: 2015-07-03 | Discharge: 2015-07-03 | Disposition: A | Discharge status: Home / Self Care | Payer: BLUE CROSS/BLUE SHIELD | Source: Ambulatory Visit | Attending: Internal Medicine | Admitting: Internal Medicine

## 2015-07-03 VITALS — BP 110/62 | HR 80 | Wt 149.5 lb

## 2015-07-03 DIAGNOSIS — I340 Nonrheumatic mitral (valve) insufficiency: Secondary | ICD-10-CM | POA: Insufficient documentation

## 2015-07-03 DIAGNOSIS — J45909 Unspecified asthma, uncomplicated: Secondary | ICD-10-CM | POA: Diagnosis not present

## 2015-07-03 DIAGNOSIS — I2510 Atherosclerotic heart disease of native coronary artery without angina pectoris: Secondary | ICD-10-CM | POA: Insufficient documentation

## 2015-07-03 DIAGNOSIS — I428 Other cardiomyopathies: Secondary | ICD-10-CM | POA: Diagnosis not present

## 2015-07-03 DIAGNOSIS — I5041 Acute combined systolic (congestive) and diastolic (congestive) heart failure: Secondary | ICD-10-CM | POA: Diagnosis not present

## 2015-07-03 DIAGNOSIS — Z7902 Long term (current) use of antithrombotics/antiplatelets: Secondary | ICD-10-CM | POA: Diagnosis not present

## 2015-07-03 DIAGNOSIS — I480 Paroxysmal atrial fibrillation: Secondary | ICD-10-CM | POA: Diagnosis not present

## 2015-07-03 DIAGNOSIS — F329 Major depressive disorder, single episode, unspecified: Secondary | ICD-10-CM | POA: Insufficient documentation

## 2015-07-03 DIAGNOSIS — I5022 Chronic systolic (congestive) heart failure: Secondary | ICD-10-CM | POA: Insufficient documentation

## 2015-07-03 DIAGNOSIS — G2581 Restless legs syndrome: Secondary | ICD-10-CM | POA: Diagnosis not present

## 2015-07-03 DIAGNOSIS — I2699 Other pulmonary embolism without acute cor pulmonale: Secondary | ICD-10-CM

## 2015-07-03 DIAGNOSIS — Z86718 Personal history of other venous thrombosis and embolism: Secondary | ICD-10-CM | POA: Insufficient documentation

## 2015-07-03 DIAGNOSIS — Z79899 Other long term (current) drug therapy: Secondary | ICD-10-CM | POA: Diagnosis not present

## 2015-07-03 DIAGNOSIS — F419 Anxiety disorder, unspecified: Secondary | ICD-10-CM | POA: Diagnosis not present

## 2015-07-03 DIAGNOSIS — Z86711 Personal history of pulmonary embolism: Secondary | ICD-10-CM | POA: Insufficient documentation

## 2015-07-03 LAB — BASIC METABOLIC PANEL
Anion gap: 11 (ref 5–15)
BUN: 14 mg/dL (ref 6–20)
CALCIUM: 9.8 mg/dL (ref 8.9–10.3)
CHLORIDE: 105 mmol/L (ref 101–111)
CO2: 25 mmol/L (ref 22–32)
CREATININE: 0.88 mg/dL (ref 0.61–1.24)
GFR calc Af Amer: >60 mL/min (ref >60)
Glucose, Bld: 99 mg/dL (ref 65–99)
POTASSIUM: 4.5 mmol/L (ref 3.5–5.1)
Sodium: 141 mmol/L (ref 135–145)

## 2015-07-03 MED ORDER — CARVEDILOL 3.125 MG PO TABS: 3.1250 mg | ORAL_TABLET | Freq: Two times a day (BID) | ORAL | Status: DC

## 2015-07-03 MED ORDER — DIGOXIN 250 MCG PO TABS: 0.2500 mg | ORAL_TABLET | Freq: Every day | ORAL | Status: DC

## 2015-07-03 NOTE — Progress Notes (Signed)
ADVANCED HF CLINIC NOTE  Patient ID: Marcus Bates, male   DOB: 04-Aug-1956, 59 y.o.   MRN: [id number]   PCP: [facility] Primary Cardiologist: Nikira Kushnir  HPI:  Marcus Bates is a 59 year old male with no significant PMHx who was admitted in August 2016 for acute systolic HF complicated by cardiogenic shock and RLL PE.  Echo 05/14/15 showed EF 10-15% with grade 3 diastolic dysfunction and LV mural thrombus. Severe RV dysfunction.  Cardiac cath 05/02/15 showed minimal nonobstructive CAD. RHC cath on 8/31 c/w low output. Was treated with milrinone which was eventually weaned off. HF med titration limited by low BP.  He developed a drug rash in the hospital. Initially thought to be due to spironolactone but persisted and then felt to be related to warfarin, transitioned to Eliquis. Discharge weight 141 pounds.  Returns today for a routine follow-up. At last visit losartan was increased to 25mg  daily. Takes Lasix 20mg  M/W/F.  No dizziness/drowsiness since this increase.  Denies chest pain, dyspnea. Walks 1 mile 2-3x/week.  No edema, orthopnea or PND. Does admit to dietary indiscretion. Weights at home are stable around 144lb, he notes his weight has increased some since discharge most likely due to diet. No bleeding issues on Eliquis.     RHC 05/07/2015 RA = 9 RV = 39/5/12 PA = 41/20 (28) PCW = 18 Fick cardiac output/index = 2.6/1.4 Therms cardiac output/index = 3.6/2.0 PVR = 3.8 WU FA sat = 98% PA sat = 54%, 57  Cardiac MRI 05/05/15 1. Severely dilated left and right ventricle with severe biventricular systolic impairment. Diffuse mid wall late gadolinium enhancement in the myocardium of the left and right ventricle and in the RV attachment points to the LV consistent with dilated cardiomyopathy with biventricular involvement and decompensated heart failure. There is no evidence of inflammatory or infiltrative cardiomyopathy. 2. Severely dilated left atrium and moderately dilated right atrium. 3.  Severe mitral and moderate tricuspid regurgitation. 4. Dilated main pulmonary artery consistent with pulmonary hypertension. There is severe swirling of the blood in the right ventricle suspicious of the pre-thrombotic state.   ROS: All systems negative except as listed in HPI, PMH and Problem List.  SH:  Social History   Social History  . Marital Status: Single    Spouse Name: N/A  . Number of Children: N/A  . Years of Education: N/A   Occupational History  .      Tools   Social History Main Topics  . Smoking status: Never Smoker   . Smokeless tobacco: Never Used  . Alcohol Use: No  . Drug Use: No  . Sexual Activity: Not on file   Other Topics Concern  . Not on file   Social History Narrative    FH:  Family History  Problem Relation Age of Onset  . Heart disease Other   . Pulmonary embolism Neg Hx   . CAD Maternal Grandfather     MI in his 22s maybe?  . Stroke Paternal Grandfather   . CAD Maternal Aunt     bypass at 16    Past Medical History  Diagnosis Date  . Asthma   . Anxiety   . Depression   . Insomnia   . Restless leg syndrome   . Severe mitral insufficiency     a. 04/2015 Echo: Sev MR.  Marland Kitchen NICM (nonischemic cardiomyopathy) (HCC)     a. 04/2015 Echo: EF 10-15%, sev diff HK.  . Chronic combined systolic and diastolic CHF (congestive heart failure) (HCC)  a. 04/2015 Echo: EF 10-15%, sev diff HK, Gr 3 DD, sev MR, sev dil LA, dilated RV, mod dil RA.  . Non-obstructive CAD     a. 04/2015 Cath: LM nl, LAD 51m, LCX 20ost, RCA 20d.  . Acute deep vein thrombosis (DVT) of popliteal vein of right lower extremity (HCC)     a. 04/2015 -> Xarelto  . Pulmonary embolism on right (HCC)     a. 04/2015 -> Xarelto  . NSVT (nonsustained ventricular tachycardia) (HCC)   . LV (left ventricular) mural thrombus (HCC)     a. 04/2015 Echo: possible laminated echodense area on inferior wall, cannot exclude laminated thrombus.    Current Outpatient Prescriptions    Medication Sig Dispense Refill  . acetaminophen (TYLENOL) 500 MG tablet Take 500 mg by mouth every 6 (six) hours as needed (calm nerves).    Marland Kitchen albuterol (PROVENTIL HFA;VENTOLIN HFA) 108 (90 BASE) MCG/ACT inhaler Inhale 2 puffs into the lungs every 4 (four) hours as needed for wheezing or shortness of breath. 1 Inhaler 2  . amphetamine-dextroamphetamine (ADDERALL) 10 MG tablet Take 1 tablet by mouth 2 (two) times daily.  0  . apixaban (ELIQUIS) 5 MG TABS tablet Take 1 tablet (5 mg total) by mouth 2 (two) times daily. 60 tablet 6  . ARIPiprazole (ABILIFY) 5 MG tablet Take 5 mg by mouth daily.    . clonazePAM (KLONOPIN) 0.5 MG tablet Take 0.5 mg by mouth at bedtime.    . digoxin (LANOXIN) 0.25 MG tablet Take 1 tablet (0.25 mg total) by mouth daily. 30 tablet 1  . Fluticasone-Salmeterol (ADVAIR) 250-50 MCG/DOSE AEPB Inhale 1 puff into the lungs 2 (two) times daily as needed (SOB, wheezing).    . furosemide (LASIX) 40 MG tablet Take 0.5 tablets (20 mg total) by mouth every Monday, Wednesday, and Friday. 30 tablet 1  . gabapentin (NEURONTIN) 600 MG tablet Take 300 mg by mouth at bedtime.     . iron polysaccharides (NIFEREX) 150 MG capsule Take 150 mg by mouth daily.    . ivabradine (CORLANOR) 5 MG TABS tablet Take 1 tablet (5 mg total) by mouth 2 (two) times daily with a meal. 60 tablet 6  . losartan (COZAAR) 25 MG tablet Take 1 tablet (25 mg total) by mouth daily. 30 tablet 3  . magnesium oxide (MAG-OX) 400 MG tablet Take 100 mg by mouth daily.    . Multiple Vitamins-Minerals (MULTIVITAMIN WITH MINERALS) tablet Take 1 tablet by mouth daily.    . vitamin A 16109 UNIT capsule Take 25,000 Units by mouth 4 (four) times a week.     . vitamin E 100 UNIT capsule Take 100 Units by mouth 3 (three) times a week.     No current facility-administered medications for this encounter.    Filed Vitals:   07/03/15 1512  BP: 110/62  Pulse: 80  Weight: 149 lb 8 oz (67.813 kg)  SpO2: 100%    PHYSICAL  EXAM: General:  Well appearing. No resp difficulty HEENT: normal Neck: supple. JVP 5. Carotids 2+ bilaterally; no bruits. No lymphadenopathy or thryomegaly appreciated. Cor: PMI laterally displaced.  Regular rate & rhythm with occasional PVC. Lungs: clear to ascultation.  Abdomen: soft, nontender, nondistended. No hepatosplenomegaly. No bruits or masses. Good bowel sounds. Extremities: no cyanosis, clubbing, rash, trace pitting edema    ASSESSMENT & PLAN: 1. Chronic systolic HF with severe biventricular dysfunction EF 10-15% due to NICM. Newly diagnosed 8/16 2. RLL pulmonary embolism  3. PAF  4. Warfarin and  ASA allergies  5. Mild nonobstructive CAD by cath 04/2015 6. Severe MR 7. LV mural thrombus  Appears euvolemic on exam without symptoms. Start low-dose Coreg 3.125 in the PM x 1week, if tolerated increase to BID.  Obtain BMET today as losartan was increased at last visit. Continue Eliquis  F/u in 4 weeks.   Crystal Dorsey,MD 3:15 PM  Patient seen and examined with Dr. Leonides Schanz. We discussed all aspects of the encounter. I agree with the assessment and plan as stated above.   He continues to improve slowly. Volume status looks ok. NYHA II-early III. Will start low-dose carvedilol as tolerated. Check labs. Can return to work with moderate duty restrictions. Will need echo in next month or two.   Corlette Ciano,MD 10:03 PM

## 2015-07-03 NOTE — Patient Instructions (Signed)
START Coreg 3.125mg  at bedtime for 1 week.... If tolerated add 1 tablet in the morning.   FOLLOW UP:3-4 weeks with Dr.Bensimhon

## 2015-07-05 DIAGNOSIS — I5022 Chronic systolic (congestive) heart failure: Secondary | ICD-10-CM | POA: Insufficient documentation

## 2015-08-13 ENCOUNTER — Encounter (HOSPITAL_COMMUNITY): Payer: Self-pay | Admitting: Internal Medicine

## 2015-08-13 ENCOUNTER — Ambulatory Visit (HOSPITAL_COMMUNITY)
Admission: RE | Admit: 2015-08-13 | Discharge: 2015-08-13 | Disposition: A | Discharge status: Home / Self Care | Payer: BLUE CROSS/BLUE SHIELD | Source: Ambulatory Visit | Attending: Internal Medicine | Admitting: Internal Medicine

## 2015-08-13 VITALS — BP 112/70 | HR 63 | Wt 149.5 lb

## 2015-08-13 DIAGNOSIS — Z79899 Other long term (current) drug therapy: Secondary | ICD-10-CM | POA: Insufficient documentation

## 2015-08-13 DIAGNOSIS — I5022 Chronic systolic (congestive) heart failure: Secondary | ICD-10-CM | POA: Diagnosis present

## 2015-08-13 DIAGNOSIS — Z86718 Personal history of other venous thrombosis and embolism: Secondary | ICD-10-CM | POA: Diagnosis not present

## 2015-08-13 DIAGNOSIS — F419 Anxiety disorder, unspecified: Secondary | ICD-10-CM | POA: Diagnosis not present

## 2015-08-13 DIAGNOSIS — I340 Nonrheumatic mitral (valve) insufficiency: Secondary | ICD-10-CM | POA: Diagnosis not present

## 2015-08-13 DIAGNOSIS — G2581 Restless legs syndrome: Secondary | ICD-10-CM | POA: Insufficient documentation

## 2015-08-13 DIAGNOSIS — Z8249 Family history of ischemic heart disease and other diseases of the circulatory system: Secondary | ICD-10-CM | POA: Diagnosis not present

## 2015-08-13 DIAGNOSIS — I480 Paroxysmal atrial fibrillation: Secondary | ICD-10-CM | POA: Diagnosis not present

## 2015-08-13 DIAGNOSIS — Z823 Family history of stroke: Secondary | ICD-10-CM | POA: Diagnosis not present

## 2015-08-13 DIAGNOSIS — I2699 Other pulmonary embolism without acute cor pulmonale: Secondary | ICD-10-CM | POA: Diagnosis not present

## 2015-08-13 DIAGNOSIS — Z7902 Long term (current) use of antithrombotics/antiplatelets: Secondary | ICD-10-CM | POA: Insufficient documentation

## 2015-08-13 DIAGNOSIS — J45909 Unspecified asthma, uncomplicated: Secondary | ICD-10-CM | POA: Diagnosis not present

## 2015-08-13 DIAGNOSIS — F329 Major depressive disorder, single episode, unspecified: Secondary | ICD-10-CM | POA: Insufficient documentation

## 2015-08-13 DIAGNOSIS — Z86711 Personal history of pulmonary embolism: Secondary | ICD-10-CM | POA: Diagnosis not present

## 2015-08-13 DIAGNOSIS — I2510 Atherosclerotic heart disease of native coronary artery without angina pectoris: Secondary | ICD-10-CM | POA: Diagnosis not present

## 2015-08-13 LAB — BRAIN NATRIURETIC PEPTIDE: B NATRIURETIC PEPTIDE 5: 478.1 pg/mL — AB (ref 0.0–100.0)

## 2015-08-13 LAB — BASIC METABOLIC PANEL
ANION GAP: 8 (ref 5–15)
BUN: 15 mg/dL (ref 6–20)
CALCIUM: 9.6 mg/dL (ref 8.9–10.3)
CO2: 27 mmol/L (ref 22–32)
Chloride: 105 mmol/L (ref 101–111)
Creatinine, Ser: 0.92 mg/dL (ref 0.61–1.24)
GFR calc Af Amer: >60 mL/min (ref >60)
GLUCOSE: 82 mg/dL (ref 65–99)
POTASSIUM: 4.8 mmol/L (ref 3.5–5.1)
SODIUM: 140 mmol/L (ref 135–145)

## 2015-08-13 NOTE — Patient Instructions (Addendum)
Routine lab work today. (bmet bnp) Will notify you of abnormal results  FOLLOW UP: 4 weeks w/ ECHO (Dr.Bensimhon)

## 2015-08-13 NOTE — Progress Notes (Signed)
Patient ID: Marcus Bates, male   DOB: December 11, 1955, 59 y.o.   MRN: [id number]   ADVANCED HF CLINIC NOTE  Patient ID: Marcus Bates, male   DOB: June 16, 1956, 59 y.o.   MRN: [id number]   PCP: [facility] Primary Cardiologist: Haakon Titsworth  HPI:  Marcus Bates is a 59 year old male with no significant PMHx who was admitted in August 2016 for acute systolic HF complicated by cardiogenic shock and RLL PE.  Echo 05/14/15 showed EF 10-15% with grade 3 diastolic dysfunction and LV mural thrombus. Severe RV dysfunction.  Cardiac cath 05/02/15 showed minimal nonobstructive CAD. RHC cath on 8/31 c/w low output. Was treated with milrinone which was eventually weaned off. HF med titration limited by low BP.  He developed a drug rash in the hospital. Initially thought to be due to spironolactone but persisted and then felt to be related to warfarin, transitioned to Eliquis. Discharge weight 141 pounds.  Returns today for a HF follow-up. At last visit added low dose Coreg. Doing ok apart from sinus infection. Isn't walking as much now with the weather. Denies CP, SOB, Orthopnea. Does occasionally get lightheaded, nothing in particular brings this on.  Weight stable at home around 146. Taking lasix M/W/F. Is always taking evening Coreg, but often skips morning dose. Takes 2-3 times a week. Does eat a lot of soup and sandwiches. Does limit his sodium, but only to less than 2500 mg. No bleeding on Eliquis.  RHC 05/07/2015 RA = 9 RV = 39/5/12 PA = 41/20 (28) PCW = 18 Fick cardiac output/index = 2.6/1.4 Therms cardiac output/index = 3.6/2.0 PVR = 3.8 WU FA sat = 98% PA sat = 54%, 57  Cardiac MRI 05/05/15 1. Severely dilated left and right ventricle with severe biventricular systolic impairment. Diffuse mid wall late gadolinium enhancement in the myocardium of the left and right ventricle and in the RV attachment points to the LV consistent with dilated cardiomyopathy with biventricular involvement and decompensated heart failure.  There is no evidence of inflammatory or infiltrative cardiomyopathy. 2. Severely dilated left atrium and moderately dilated right atrium. 3. Severe mitral and moderate tricuspid regurgitation. 4. Dilated main pulmonary artery consistent with pulmonary hypertension. There is severe swirling of the blood in the right ventricle suspicious of the pre-thrombotic state.   ROS: All systems negative except as listed in HPI, PMH and Problem List.  SH:  Social History   Social History  . Marital Status: Single    Spouse Name: N/A  . Number of Children: N/A  . Years of Education: N/A   Occupational History  .      Tools   Social History Main Topics  . Smoking status: Never Smoker   . Smokeless tobacco: Never Used  . Alcohol Use: No  . Drug Use: No  . Sexual Activity: Not on file   Other Topics Concern  . Not on file   Social History Narrative    FH:  Family History  Problem Relation Age of Onset  . Heart disease Other   . Pulmonary embolism Neg Hx   . CAD Maternal Grandfather     MI in his 47s maybe?  . Stroke Paternal Grandfather   . CAD Maternal Aunt     bypass at 19    Past Medical History  Diagnosis Date  . Asthma   . Anxiety   . Depression   . Insomnia   . Restless leg syndrome   . Severe mitral insufficiency     a. 04/2015 Echo: Sev  MR.  . NICM (nonischemic cardiomyopathy) (HCC)     a. 04/2015 Echo: EF 10-15%, sev diff HK.  . Chronic combined systolic and diastolic CHF (congestive heart failure) (HCC)     a. 04/2015 Echo: EF 10-15%, sev diff HK, Gr 3 DD, sev MR, sev dil LA, dilated RV, mod dil RA.  . Non-obstructive CAD     a. 04/2015 Cath: LM nl, LAD 62m, LCX 20ost, RCA 20d.  . Acute deep vein thrombosis (DVT) of popliteal vein of right lower extremity (HCC)     a. 04/2015 -> Xarelto  . Pulmonary embolism on right (HCC)     a. 04/2015 -> Xarelto  . NSVT (nonsustained ventricular tachycardia) (HCC)   . LV (left ventricular) mural thrombus (HCC)     a.  04/2015 Echo: possible laminated echodense area on inferior wall, cannot exclude laminated thrombus.    Current Outpatient Prescriptions  Medication Sig Dispense Refill  . acetaminophen (TYLENOL) 500 MG tablet Take 500 mg by mouth every 6 (six) hours as needed (calm nerves).    Marland Kitchen albuterol (PROVENTIL HFA;VENTOLIN HFA) 108 (90 BASE) MCG/ACT inhaler Inhale 2 puffs into the lungs every 4 (four) hours as needed for wheezing or shortness of breath. 1 Inhaler 2  . amphetamine-dextroamphetamine (ADDERALL) 10 MG tablet Take 1 tablet by mouth 2 (two) times daily.  0  . apixaban (ELIQUIS) 5 MG TABS tablet Take 1 tablet (5 mg total) by mouth 2 (two) times daily. 60 tablet 6  . ARIPiprazole (ABILIFY) 5 MG tablet Take 5 mg by mouth daily.    . carvedilol (COREG) 3.125 MG tablet Take 1 tablet (3.125 mg total) by mouth 2 (two) times daily with a meal. 60 tablet 3  . clonazePAM (KLONOPIN) 0.5 MG tablet Take 0.5 mg by mouth at bedtime.    . digoxin (LANOXIN) 0.25 MG tablet Take 1 tablet (0.25 mg total) by mouth daily. 30 tablet 3  . Fluticasone-Salmeterol (ADVAIR) 250-50 MCG/DOSE AEPB Inhale 1 puff into the lungs 2 (two) times daily as needed (SOB, wheezing).    . furosemide (LASIX) 40 MG tablet Take 0.5 tablets (20 mg total) by mouth every Monday, Wednesday, and Friday. 30 tablet 1  . gabapentin (NEURONTIN) 600 MG tablet Take 300 mg by mouth at bedtime.     . iron polysaccharides (NIFEREX) 150 MG capsule Take 150 mg by mouth daily.    . ivabradine (CORLANOR) 5 MG TABS tablet Take 1 tablet (5 mg total) by mouth 2 (two) times daily with a meal. 60 tablet 6  . losartan (COZAAR) 25 MG tablet Take 1 tablet (25 mg total) by mouth daily. 30 tablet 3  . magnesium oxide (MAG-OX) 400 MG tablet Take 100 mg by mouth daily.    . Multiple Vitamins-Minerals (MULTIVITAMIN WITH MINERALS) tablet Take 1 tablet by mouth daily.    . vitamin A 60737 UNIT capsule Take 25,000 Units by mouth 4 (four) times a week.     . vitamin E 100  UNIT capsule Take 100 Units by mouth 3 (three) times a week.     No current facility-administered medications for this encounter.    Filed Vitals:   08/13/15 1521  BP: 112/70  Pulse: 63  Weight: 149 lb 8 oz (67.813 kg)  SpO2: 100%     PHYSICAL EXAM: General:  Well appearing. No resp difficulty HEENT: normal Neck: supple. JVP flat. Carotids 2+ bilaterally; no bruits. No thyromegaly or nodule noted Cor: PMI laterally displaced.  RRR, no M/G/R Lungs: CTAB, normal  effort Abdomen: soft, NT, ND, no HSM. No bruits or masses. +BS  Extremities: no cyanosis, clubbing, rash, no edema    ASSESSMENT & PLAN: 1. Chronic systolic HF with severe biventricular dysfunction EF 10-15% due to NICM. Newly diagnosed 8/16 2. RLL pulmonary embolism  3. PAF  4. Warfarin and ASA allergies  5. Mild nonobstructive CAD by cath 04/2015 6. Severe MR 7. LV mural thrombus  Stable. Will get BMET/BNP today. Follow up in 4 weeks with Echo. Encouraged to take Coreg BID.   No room to titrate other meds with fatigue and bradycardia.  Graciella Freer, PA-C 3:30 PM   Patient seen and examined with Otilio Saber, PA-C. We discussed all aspects of the encounter. I agree with the assessment and plan as stated above.   Overall much improved. Volume status looks good. Tachycardia and s3 now resolved. Suspect LV function likely improving. No room to titrate meds further at this point. Will continue current regimen. Echo at next visit.  Continue Eliquis for LV thrombus, PAF and PE.   Avram Danielson,MD 12:46 PM

## 2015-09-10 ENCOUNTER — Ambulatory Visit (HOSPITAL_BASED_OUTPATIENT_CLINIC_OR_DEPARTMENT_OTHER)
Admission: RE | Admit: 2015-09-10 | Discharge: 2015-09-10 | Disposition: A | Discharge status: Home / Self Care | Payer: BLUE CROSS/BLUE SHIELD | Source: Ambulatory Visit | Attending: Internal Medicine | Admitting: Internal Medicine

## 2015-09-10 ENCOUNTER — Ambulatory Visit (HOSPITAL_COMMUNITY)
Admission: RE | Admit: 2015-09-10 | Discharge: 2015-09-10 | Disposition: A | Discharge status: Home / Self Care | Payer: BLUE CROSS/BLUE SHIELD | Source: Ambulatory Visit | Attending: Family Medicine | Admitting: Family Medicine

## 2015-09-10 ENCOUNTER — Encounter (HOSPITAL_COMMUNITY): Payer: Self-pay | Admitting: Internal Medicine

## 2015-09-10 VITALS — BP 110/62 | HR 62 | Wt 150.2 lb

## 2015-09-10 DIAGNOSIS — I5041 Acute combined systolic (congestive) and diastolic (congestive) heart failure: Secondary | ICD-10-CM | POA: Diagnosis not present

## 2015-09-10 DIAGNOSIS — I213 ST elevation (STEMI) myocardial infarction of unspecified site: Secondary | ICD-10-CM | POA: Diagnosis not present

## 2015-09-10 DIAGNOSIS — I517 Cardiomegaly: Secondary | ICD-10-CM | POA: Insufficient documentation

## 2015-09-10 DIAGNOSIS — I513 Intracardiac thrombosis, not elsewhere classified: Secondary | ICD-10-CM

## 2015-09-10 DIAGNOSIS — I5022 Chronic systolic (congestive) heart failure: Secondary | ICD-10-CM | POA: Diagnosis not present

## 2015-09-10 DIAGNOSIS — I480 Paroxysmal atrial fibrillation: Secondary | ICD-10-CM | POA: Diagnosis not present

## 2015-09-10 MED ORDER — LOSARTAN POTASSIUM 25 MG PO TABS: 25.0000 mg | ORAL_TABLET | Freq: Two times a day (BID) | ORAL | Status: DC

## 2015-09-10 NOTE — Patient Instructions (Signed)
INCREASE Losartan to 25 mg (1 tab) twice daily.  Return in 1 week for labs.  Follow up 2 months.  Do the following things EVERYDAY: 1) Weigh yourself in the morning before breakfast. Write it down and keep it in a log. 2) Take your medicines as prescribed 3) Eat low salt foods-Limit salt (sodium) to 2000 mg per day.  4) Stay as active as you can everyday 5) Limit all fluids for the day to less than 2 liters

## 2015-09-10 NOTE — Progress Notes (Signed)
  Echocardiogram 2D Echocardiogram has been performed.  Marcus Bates 09/10/2015, 3:08 PM

## 2015-09-10 NOTE — Progress Notes (Signed)
ADVANCED HF CLINIC NOTE  Patient ID: Marcus Bates, male   DOB: 1956/08/04, 60 y.o.   MRN: [id number]   PCP: [facility] Primary Cardiologist: Aviela Blundell  HPI:  Marcus Bates is a 60 year old male with no significant PMHx who was admitted in August 2016 for acute systolic HF complicated by cardiogenic shock and RLL PE.  Echo 05/14/15 showed EF 10-15% with grade 3 diastolic dysfunction and LV mural thrombus. Severe RV dysfunction.  Cardiac cath 05/02/15 showed minimal nonobstructive CAD. RHC cath on 8/31 c/w low output. Was treated with milrinone which was eventually weaned off. HF med titration limited by low BP.  He developed a drug rash in the hospital. Initially thought to be due to spironolactone but persisted and then felt to be related to warfarin, transitioned to Eliquis. Discharge weight 141 pounds.  He returns for HF follow up. Overall feeling better. Denies SOB/PND/Orthopnea. Taking lasix 3 times a day . Weight at home 146 pounds. Taking all medications. Working full time. Second shift. Marland Kitchen   RHC 05/07/2015 RA = 9 RV = 39/5/12 PA = 41/20 (28) PCW = 18 Fick cardiac output/index = 2.6/1.4 Therms cardiac output/index = 3.6/2.0 PVR = 3.8 WU FA sat = 98% PA sat = 54%, 57  Cardiac MRI 05/05/15 1. Severely dilated left and right ventricle with severe biventricular systolic impairment. LVEF 13%  Diffuse mid wall late gadolinium enhancement in the myocardium of the left and right ventricle and in the RV attachment points to the LV consistent with dilated cardiomyopathy with biventricular involvement and decompensated heart failure. There is no evidence of inflammatory or infiltrative cardiomyopathy. 2. Severely dilated left atrium and moderately dilated right atrium. 3. Severe mitral and moderate tricuspid regurgitation. 4. Dilated main pulmonary artery consistent with pulmonary hypertension. There is severe swirling of the blood in the right ventricle suspicious of the pre-thrombotic  state.   ROS: All systems negative except as listed in HPI, PMH and Problem List.  SH:  Social History   Social History  . Marital Status: Single    Spouse Name: N/A  . Number of Children: N/A  . Years of Education: N/A   Occupational History  .      Tools   Social History Main Topics  . Smoking status: Never Smoker   . Smokeless tobacco: Never Used  . Alcohol Use: No  . Drug Use: No  . Sexual Activity: Not on file   Other Topics Concern  . Not on file   Social History Narrative    FH:  Family History  Problem Relation Age of Onset  . Heart disease Other   . Pulmonary embolism Neg Hx   . CAD Maternal Grandfather     MI in his 81s maybe?  . Stroke Paternal Grandfather   . CAD Maternal Aunt     bypass at 65    Past Medical History  Diagnosis Date  . Asthma   . Anxiety   . Depression   . Insomnia   . Restless leg syndrome   . Severe mitral insufficiency     a. 04/2015 Echo: Sev MR.  Marland Kitchen NICM (nonischemic cardiomyopathy) (HCC)     a. 04/2015 Echo: EF 10-15%, sev diff HK.  . Chronic combined systolic and diastolic CHF (congestive heart failure) (HCC)     a. 04/2015 Echo: EF 10-15%, sev diff HK, Gr 3 DD, sev MR, sev dil LA, dilated RV, mod dil RA.  . Non-obstructive CAD     a. 04/2015 Cath: LM nl,  LAD 18m, LCX 20ost, RCA 20d.  . Acute deep vein thrombosis (DVT) of popliteal vein of right lower extremity (HCC)     a. 04/2015 -> Xarelto  . Pulmonary embolism on right (HCC)     a. 04/2015 -> Xarelto  . NSVT (nonsustained ventricular tachycardia) (HCC)   . LV (left ventricular) mural thrombus (HCC)     a. 04/2015 Echo: possible laminated echodense area on inferior wall, cannot exclude laminated thrombus.    Current Outpatient Prescriptions  Medication Sig Dispense Refill  . acetaminophen (TYLENOL) 500 MG tablet Take 500 mg by mouth every 6 (six) hours as needed (calm nerves).    Marland Kitchen albuterol (PROVENTIL HFA;VENTOLIN HFA) 108 (90 BASE) MCG/ACT inhaler Inhale 2 puffs  into the lungs every 4 (four) hours as needed for wheezing or shortness of breath. 1 Inhaler 2  . amphetamine-dextroamphetamine (ADDERALL) 10 MG tablet Take 1 tablet by mouth 2 (two) times daily.  0  . apixaban (ELIQUIS) 5 MG TABS tablet Take 1 tablet (5 mg total) by mouth 2 (two) times daily. 60 tablet 6  . carvedilol (COREG) 3.125 MG tablet Take 1 tablet (3.125 mg total) by mouth 2 (two) times daily with a meal. 60 tablet 3  . clonazePAM (KLONOPIN) 0.5 MG tablet Take 0.5 mg by mouth at bedtime.    . digoxin (LANOXIN) 0.25 MG tablet Take 1 tablet (0.25 mg total) by mouth daily. 30 tablet 3  . Fluticasone-Salmeterol (ADVAIR) 250-50 MCG/DOSE AEPB Inhale 1 puff into the lungs 2 (two) times daily as needed (SOB, wheezing).    . furosemide (LASIX) 40 MG tablet Take 0.5 tablets (20 mg total) by mouth every Monday, Wednesday, and Friday. 30 tablet 1  . gabapentin (NEURONTIN) 600 MG tablet Take 300 mg by mouth at bedtime.     . iron polysaccharides (NIFEREX) 150 MG capsule Take 150 mg by mouth daily.    . ivabradine (CORLANOR) 5 MG TABS tablet Take 1 tablet (5 mg total) by mouth 2 (two) times daily with a meal. 60 tablet 6  . losartan (COZAAR) 25 MG tablet Take 1 tablet (25 mg total) by mouth daily. 30 tablet 3  . magnesium oxide (MAG-OX) 400 MG tablet Take 100 mg by mouth daily.    . Multiple Vitamins-Minerals (MULTIVITAMIN WITH MINERALS) tablet Take 1 tablet by mouth daily.    . vitamin A 40981 UNIT capsule Take 25,000 Units by mouth 4 (four) times a week.     . vitamin E 100 UNIT capsule Take 100 Units by mouth 3 (three) times a week.     No current facility-administered medications for this encounter.    Filed Vitals:   09/10/15 1507  BP: 110/62  Pulse: 62  Weight: 150 lb 4 oz (68.153 kg)  SpO2: 100%     PHYSICAL EXAM: General:  Well appearing. No resp difficulty HEENT: normal Neck: supple. JVP flat. Carotids 2+ bilaterally; no bruits. No thyromegaly or nodule noted Cor: PMI laterally  displaced.  RRR, no M/G/R Lungs: CTAB, normal effort Abdomen: soft, NT, ND, no HSM. No bruits or masses. +BS  Extremities: no cyanosis, clubbing, rash, no edema    ASSESSMENT & PLAN: 1. Chronic systolic HF with severe biventricular dysfunction EF 10-15% due to NICM. Newly diagnosed 8/16. Todays ECHO was reviewed and discussed by Dr Gala Romney. EF 25%.  NYHA I. Volume status stable. Continue lasix 3 times a day.  Continue carvedilol 3.125 mg twice a day and will increase losartan 25 mg to twice a day.  Continue digoxin 0.25 mg daily Continue corlanor 5 mg twice a day  2. RLL pulmonary embolism - on eliquis. No bleeding problems  3. PAF - Regular rhythm. Continue eliquis twice a day.  4. Warfarin and ASA allergies  5. Mild nonobstructive CAD by cath 04/2015 6. Severe MR 7. LV mural thrombus- on eliquis. LV thrombus resolved on today's echo  Repeat BMET 10 days. Follow up 2 months.   Tonye Becket, NP-C  3:24 PM   Patient seen and examined with Tonye Becket, NP. We discussed all aspects of the encounter. I agree with the assessment and plan as stated above.   He continues to improve. NYHA II. Volume status ok. No longer tachycardic. Echo reviewed personally an EF now in 25% range. (Up from 15%), LV thrombus no longer present No further AF. BP soft. Will increase losartan to 25 bid. Given gradual improvement in EF hold off on ICD for now. Continue Eliquis for PAF.   Marcus Germer,MD 9:05 PM

## 2015-09-11 ENCOUNTER — Other Ambulatory Visit (HOSPITAL_COMMUNITY): Payer: Self-pay | Admitting: *Deleted

## 2015-09-11 ENCOUNTER — Telehealth (HOSPITAL_COMMUNITY): Payer: Self-pay

## 2015-09-11 MED ORDER — LOSARTAN POTASSIUM 50 MG PO TABS: 25.0000 mg | ORAL_TABLET | Freq: Two times a day (BID) | ORAL | Status: DC

## 2015-09-11 NOTE — Telephone Encounter (Signed)
[facility] called to request PA for Losartan (insurance only approves one tab, not two per day). Will forward to CHF Pharmacist Erika to complete.  Ave Filter

## 2015-09-15 ENCOUNTER — Telehealth (HOSPITAL_COMMUNITY): Payer: Self-pay | Admitting: Pharmacist

## 2015-09-15 NOTE — Telephone Encounter (Signed)
Losartan 25 mg BID PA approved by [employer] from 09/11/2015 through 09/05/2038.  Tyler Deis. Bonnye Fava, PharmD, BCPS, CPP Clinical Pharmacist Pager: [phone] Phone: [phone] 09/15/2015 10:55 AM

## 2015-09-16 ENCOUNTER — Other Ambulatory Visit (HOSPITAL_COMMUNITY): Payer: Self-pay | Admitting: Internal Medicine

## 2015-09-16 ENCOUNTER — Other Ambulatory Visit (HOSPITAL_COMMUNITY): Payer: Self-pay | Admitting: *Deleted

## 2015-09-16 MED ORDER — CARVEDILOL 3.125 MG PO TABS: 3.1250 mg | ORAL_TABLET | Freq: Two times a day (BID) | ORAL | Status: DC

## 2015-09-22 ENCOUNTER — Ambulatory Visit (HOSPITAL_COMMUNITY)
Admission: RE | Admit: 2015-09-22 | Discharge: 2015-09-22 | Disposition: A | Discharge status: Home / Self Care | Payer: BLUE CROSS/BLUE SHIELD | Source: Ambulatory Visit | Attending: Internal Medicine | Admitting: Internal Medicine

## 2015-09-22 DIAGNOSIS — I5022 Chronic systolic (congestive) heart failure: Secondary | ICD-10-CM | POA: Diagnosis not present

## 2015-09-22 LAB — BASIC METABOLIC PANEL
Anion gap: 7 (ref 5–15)
BUN: 22 mg/dL — AB (ref 6–20)
CHLORIDE: 107 mmol/L (ref 101–111)
CO2: 28 mmol/L (ref 22–32)
CREATININE: 0.85 mg/dL (ref 0.61–1.24)
Calcium: 9.2 mg/dL (ref 8.9–10.3)
GFR calc Af Amer: >60 mL/min (ref >60)
GFR calc non Af Amer: >60 mL/min (ref >60)
Glucose, Bld: 96 mg/dL (ref 65–99)
Potassium: 4.3 mmol/L (ref 3.5–5.1)
Sodium: 142 mmol/L (ref 135–145)

## 2015-11-10 ENCOUNTER — Ambulatory Visit (HOSPITAL_COMMUNITY)
Admission: RE | Admit: 2015-11-10 | Discharge: 2015-11-10 | Disposition: A | Discharge status: Home / Self Care | Payer: BLUE CROSS/BLUE SHIELD | Source: Ambulatory Visit | Attending: Internal Medicine | Admitting: Internal Medicine

## 2015-11-10 ENCOUNTER — Encounter (HOSPITAL_COMMUNITY): Payer: Self-pay | Admitting: Internal Medicine

## 2015-11-10 VITALS — BP 128/68 | HR 64 | Wt 151.2 lb

## 2015-11-10 DIAGNOSIS — I5022 Chronic systolic (congestive) heart failure: Secondary | ICD-10-CM | POA: Diagnosis present

## 2015-11-10 DIAGNOSIS — Z79899 Other long term (current) drug therapy: Secondary | ICD-10-CM | POA: Insufficient documentation

## 2015-11-10 LAB — BASIC METABOLIC PANEL
Anion gap: 11 (ref 5–15)
BUN: 18 mg/dL (ref 6–20)
CHLORIDE: 106 mmol/L (ref 101–111)
CO2: 24 mmol/L (ref 22–32)
CREATININE: 0.9 mg/dL (ref 0.61–1.24)
Calcium: 9.8 mg/dL (ref 8.9–10.3)
GFR calc Af Amer: >60 mL/min (ref >60)
GFR calc non Af Amer: >60 mL/min (ref >60)
GLUCOSE: 96 mg/dL (ref 65–99)
Potassium: 4.5 mmol/L (ref 3.5–5.1)
SODIUM: 141 mmol/L (ref 135–145)

## 2015-11-10 MED ORDER — LOSARTAN POTASSIUM 50 MG PO TABS: 50.0000 mg | ORAL_TABLET | Freq: Two times a day (BID) | ORAL | Status: DC

## 2015-11-10 NOTE — Progress Notes (Signed)
Advanced Heart Failure Medication Review by a Pharmacist  Does the patient  feel that his/her medications are working for him/her?  yes  Has the patient been experiencing any side effects to the medications prescribed?  no  Does the patient measure his/her own blood pressure or blood glucose at home?  yes   Does the patient have any problems obtaining medications due to transportation or finances?   No - provided him with a [employer] copay assistance card since he has Nurse, learning disability  Understanding of regimen: good Understanding of indications: good Potential of compliance: good Patient understands to avoid NSAIDs. Patient understands to avoid decongestants.  Issues to address at subsequent visits: None   Pharmacist comments:  Mr. Sutley is a pleasant 60 yo M presenting without a medication list but with a good understanding of his regimen including most dosages. He did not have any specific medication-related questions or concerns for me at this time. We again addressed the risks associated with vitamin E, passion flower, Adderall and guarana and he stated that he is only taking the lowest doses possible for help with energy and attention.   Tyler Deis. Bonnye Fava, PharmD, BCPS, CPP Clinical Pharmacist Pager: [phone] Phone: [phone] 11/10/2015 3:33 PM      Time with patient: 16 minutes Preparation and documentation time: 2 minutes Total time: 18 minutes

## 2015-11-10 NOTE — Patient Instructions (Signed)
Increase Losartan to 25 mg in AM and 50 mg in PM for 1-2 weeks, if feeling ok increase to 50 mg Twice daily   Labs today  Your physician recommends that you schedule a follow-up appointment in: 2 months

## 2015-11-10 NOTE — Progress Notes (Signed)
Patient ID: Marcus Bates, male   DOB: 1955/11/07, 60 y.o.   MRN: [id number]   ADVANCED HF CLINIC NOTE  Patient ID: Marcus Bates, male   DOB: Jan 12, 1956, 60 y.o.   MRN: [id number]   PCP: Marcus Bates Primary Cardiologist: Marcus Bates  HPI:  Marcus Bates is a 60 year old male with no significant PMHx who was admitted in August 2016 for acute systolic HF complicated by cardiogenic shock and RLL PE.  Echo 05/14/15 showed EF 10-15% with grade 3 diastolic dysfunction and LV mural thrombus. Severe RV dysfunction.  Cardiac cath 05/02/15 showed minimal nonobstructive CAD. RHC cath on 8/31 c/w low output. Was treated with milrinone which was eventually weaned off. HF med titration limited by low BP.  He developed a drug rash in the hospital. Initially thought to be due to spironolactone but persisted and then felt to be related to warfarin, transitioned to Eliquis. Discharge weight 141 pounds.  He returns for HF follow up. At last visit echo showed improvement of EF from 10-15% to 25%. Losartan added. Denies lightheadedness or dizziness. Overall feels about the same.  Continues to not have restful sleep. Has had negative sleep studies (no OSA, just periodic limb movement and restless legs.  Continues to take lasix 3 times a week. Days vary. Weight at home remains stable 145-147. Denies Orthopea/PND.  Feels like his breathing has improved significantly. States last summer he was walking 300 ft before he had to stop, now can walk as far as he wants on flat ground. Still working full time on second shift at [employer].   RHC 05/07/2015 RA = 9 RV = 39/5/12 PA = 41/20 (28) PCW = 18 Fick cardiac output/index = 2.6/1.4 Therms cardiac output/index = 3.6/2.0 PVR = 3.8 WU FA sat = 98% PA sat = 54%, 57  Cardiac MRI 05/05/15 1. Severely dilated left and right ventricle with severe biventricular systolic impairment. LVEF 13%  Diffuse mid wall late gadolinium enhancement in the myocardium of the left and right ventricle and in the RV  attachment points to the LV consistent with dilated cardiomyopathy with biventricular involvement and decompensated heart failure. There is no evidence of inflammatory or infiltrative cardiomyopathy. 2. Severely dilated left atrium and moderately dilated right atrium. 3. Severe mitral and moderate tricuspid regurgitation. 4. Dilated main pulmonary artery consistent with pulmonary hypertension. There is severe swirling of the blood in the right ventricle suspicious of the pre-thrombotic state.   ROS: All systems negative except as listed in HPI, PMH and Problem List.  SH:  Social History   Social History  . Marital Status: Single    Spouse Name: N/A  . Number of Children: N/A  . Years of Education: N/A   Occupational History  .      Tools   Social History Main Topics  . Smoking status: Never Smoker   . Smokeless tobacco: Never Used  . Alcohol Use: No  . Drug Use: No  . Sexual Activity: Not on file   Other Topics Concern  . Not on file   Social History Narrative    FH:  Family History  Problem Relation Age of Onset  . Heart disease Other   . Pulmonary embolism Neg Hx   . CAD Maternal Grandfather     MI in his 38s maybe?  . Stroke Paternal Grandfather   . CAD Maternal Aunt     bypass at 67    Past Medical History  Diagnosis Date  . Asthma   . Anxiety   .  Depression   . Insomnia   . Restless leg syndrome   . Severe mitral insufficiency     a. 04/2015 Echo: Sev MR.  Marland Kitchen NICM (nonischemic cardiomyopathy) (HCC)     a. 04/2015 Echo: EF 10-15%, sev diff HK.  . Chronic combined systolic and diastolic CHF (congestive heart failure) (HCC)     a. 04/2015 Echo: EF 10-15%, sev diff HK, Gr 3 DD, sev MR, sev dil LA, dilated RV, mod dil RA.  . Non-obstructive CAD     a. 04/2015 Cath: LM nl, LAD 55m, LCX 20ost, RCA 20d.  . Acute deep vein thrombosis (DVT) of popliteal vein of right lower extremity (HCC)     a. 04/2015 -> Xarelto  . Pulmonary embolism on right (HCC)     a.  04/2015 -> Xarelto  . NSVT (nonsustained ventricular tachycardia) (HCC)   . LV (left ventricular) mural thrombus (HCC)     a. 04/2015 Echo: possible laminated echodense area on inferior wall, cannot exclude laminated thrombus.    Current Outpatient Prescriptions  Medication Sig Dispense Refill  . acetaminophen (TYLENOL) 500 MG tablet Take 500 mg by mouth every 6 (six) hours as needed (calm nerves).    Marland Kitchen albuterol (PROVENTIL HFA;VENTOLIN HFA) 108 (90 BASE) MCG/ACT inhaler Inhale 2 puffs into the lungs every 4 (four) hours as needed for wheezing or shortness of breath. 1 Inhaler 2  . amphetamine-dextroamphetamine (ADDERALL) 10 MG tablet Take 1 tablet by mouth 2 (two) times daily.  0  . apixaban (ELIQUIS) 5 MG TABS tablet Take 1 tablet (5 mg total) by mouth 2 (two) times daily. 60 tablet 6  . carvedilol (COREG) 3.125 MG tablet Take 1 tablet (3.125 mg total) by mouth 2 (two) times daily with a meal. 60 tablet 3  . carvedilol (COREG) 3.125 MG tablet TAKE 1 TABLET BY MOUTH TWICE A DAY WITH MEALS 60 tablet 1  . clonazePAM (KLONOPIN) 0.5 MG tablet Take 0.5 mg by mouth at bedtime.    . digoxin (LANOXIN) 0.25 MG tablet Take 1 tablet (0.25 mg total) by mouth daily. 30 tablet 3  . Fluticasone-Salmeterol (ADVAIR) 250-50 MCG/DOSE AEPB Inhale 1 puff into the lungs 2 (two) times daily as needed (SOB, wheezing).    . furosemide (LASIX) 40 MG tablet Take 0.5 tablets (20 mg total) by mouth every Monday, Wednesday, and Friday. 30 tablet 1  . gabapentin (NEURONTIN) 600 MG tablet Take 300 mg by mouth at bedtime.     . iron polysaccharides (NIFEREX) 150 MG capsule Take 150 mg by mouth daily.    . ivabradine (CORLANOR) 5 MG TABS tablet Take 1 tablet (5 mg total) by mouth 2 (two) times daily with a meal. 60 tablet 6  . losartan (COZAAR) 50 MG tablet Take 0.5 tablets (25 mg total) by mouth 2 (two) times daily. 30 tablet 3  . magnesium oxide (MAG-OX) 400 MG tablet Take 100 mg by mouth daily.    . Multiple  Vitamins-Minerals (MULTIVITAMIN WITH MINERALS) tablet Take 1 tablet by mouth daily.    . vitamin A 16109 UNIT capsule Take 25,000 Units by mouth 4 (four) times a week.     . vitamin E 100 UNIT capsule Take 100 Units by mouth 3 (three) times a week.     No current facility-administered medications for this encounter.    Filed Vitals:   11/10/15 1509  BP: 128/68  Pulse: 64  Weight: 151 lb 4 oz (68.607 kg)  SpO2: 100%    Wt Readings from Last  3 Encounters:  11/10/15 151 lb 4 oz (68.607 kg)  09/10/15 150 lb 4 oz (68.153 kg)  08/13/15 149 lb 8 oz (67.813 kg)    PHYSICAL EXAM: General:  Well appearing. No resp difficulty HEENT: normal Neck: supple. JVP 6-7. Carotids 2+ bilaterally; no bruits. No thyromegaly or lymphadenopathy noted Cor: PMI laterally displaced.  RRR, no M/G/R Lungs: Clear, normal effort Abdomen: soft, non-tender, non-distended, no HSM. No bruits or masses. +BS  Extremities: no cyanosis, clubbing, rash, no edema    ASSESSMENT & PLAN: 1. Chronic systolic HF with severe biventricular dysfunction EF 10-15% due to NICM. Newly diagnosed 8/16. Echo 09/10/15 with EF 25%.  - NYHA II. Volume status stable. Continue lasix 20 mg 3 times a week.  - Continue carvedilol 3.125 mg BID - Increase losartan to 50 mg BID   - Continue digoxin 0.25 mg daily - Continue corlanor 5 mg BID - Will need repeat echo in 3-4 months with continued titration of medications.  2. RLL pulmonary embolism - on eliquis. No bleeding problems.  3. PAF - Regular rhythm. Continue eliquis twice a day.  4. Warfarin and ASA allergies  5. Mild nonobstructive CAD by cath 04/2015 6. Severe MR 7. LV mural thrombus- on eliquis. LV thrombus resolved on last echo  Follow up 2 months with med titration. Then echo after that.   Graciella Freer, PA-C  3:14 PM   Patient seen and examined with Otilio Saber, PA-C. We discussed all aspects of the encounter. I agree with the assessment and plan as stated above.     He continue to improve. NYHA II. Volume status looks good. Agree with increasing losartan. EF improving so he would like to wait prior to proceeding with ICD. Understands slight risk of SCD. Continue Eliquis for PAF and recent DVT.   [place],MD 12:55 PM

## 2015-11-19 ENCOUNTER — Other Ambulatory Visit (HOSPITAL_COMMUNITY): Payer: Self-pay | Admitting: Internal Medicine

## 2015-12-30 DIAGNOSIS — G4712 Idiopathic hypersomnia without long sleep time: Secondary | ICD-10-CM | POA: Diagnosis not present

## 2015-12-30 DIAGNOSIS — G4721 Circadian rhythm sleep disorder, delayed sleep phase type: Secondary | ICD-10-CM | POA: Diagnosis not present

## 2015-12-31 ENCOUNTER — Other Ambulatory Visit (HOSPITAL_COMMUNITY): Payer: Self-pay | Admitting: *Deleted

## 2015-12-31 MED ORDER — LOSARTAN POTASSIUM 50 MG PO TABS: 50.0000 mg | ORAL_TABLET | Freq: Two times a day (BID) | ORAL | Status: DC

## 2016-01-04 ENCOUNTER — Other Ambulatory Visit (HOSPITAL_COMMUNITY): Payer: Self-pay | Admitting: Internal Medicine

## 2016-01-05 ENCOUNTER — Telehealth (HOSPITAL_COMMUNITY): Payer: Self-pay | Admitting: Pharmacist

## 2016-01-05 NOTE — Telephone Encounter (Signed)
Losartan 50 mg BID PA approved by [employer] through 09/05/38.   Tyler Deis. Bonnye Fava, PharmD, BCPS, CPP Clinical Pharmacist Pager: [phone] Phone: [phone] 01/05/2016 3:37 PM

## 2016-01-19 ENCOUNTER — Ambulatory Visit (HOSPITAL_COMMUNITY)
Admission: RE | Admit: 2016-01-19 | Discharge: 2016-01-19 | Disposition: A | Discharge status: Home / Self Care | Payer: BLUE CROSS/BLUE SHIELD | Source: Ambulatory Visit | Attending: Internal Medicine | Admitting: Internal Medicine

## 2016-01-19 ENCOUNTER — Encounter (HOSPITAL_COMMUNITY): Payer: Self-pay | Admitting: Internal Medicine

## 2016-01-19 VITALS — BP 122/72 | HR 67 | Wt 151.0 lb

## 2016-01-19 DIAGNOSIS — I428 Other cardiomyopathies: Secondary | ICD-10-CM | POA: Insufficient documentation

## 2016-01-19 DIAGNOSIS — Z79899 Other long term (current) drug therapy: Secondary | ICD-10-CM | POA: Diagnosis not present

## 2016-01-19 DIAGNOSIS — I5022 Chronic systolic (congestive) heart failure: Secondary | ICD-10-CM

## 2016-01-19 DIAGNOSIS — I2510 Atherosclerotic heart disease of native coronary artery without angina pectoris: Secondary | ICD-10-CM | POA: Insufficient documentation

## 2016-01-19 DIAGNOSIS — I480 Paroxysmal atrial fibrillation: Secondary | ICD-10-CM | POA: Diagnosis not present

## 2016-01-19 DIAGNOSIS — J45909 Unspecified asthma, uncomplicated: Secondary | ICD-10-CM | POA: Insufficient documentation

## 2016-01-19 DIAGNOSIS — Z86711 Personal history of pulmonary embolism: Secondary | ICD-10-CM | POA: Diagnosis not present

## 2016-01-19 DIAGNOSIS — Z86718 Personal history of other venous thrombosis and embolism: Secondary | ICD-10-CM | POA: Insufficient documentation

## 2016-01-19 DIAGNOSIS — I340 Nonrheumatic mitral (valve) insufficiency: Secondary | ICD-10-CM | POA: Diagnosis not present

## 2016-01-19 DIAGNOSIS — Z7901 Long term (current) use of anticoagulants: Secondary | ICD-10-CM | POA: Diagnosis not present

## 2016-01-19 LAB — BASIC METABOLIC PANEL
ANION GAP: 9 (ref 5–15)
BUN: 19 mg/dL (ref 6–20)
CALCIUM: 9.7 mg/dL (ref 8.9–10.3)
CO2: 26 mmol/L (ref 22–32)
Chloride: 103 mmol/L (ref 101–111)
Creatinine, Ser: 0.9 mg/dL (ref 0.61–1.24)
GFR calc Af Amer: >60 mL/min (ref >60)
GLUCOSE: 88 mg/dL (ref 65–99)
Potassium: 4.3 mmol/L (ref 3.5–5.1)
SODIUM: 138 mmol/L (ref 135–145)

## 2016-01-19 LAB — DIGOXIN LEVEL: DIGOXIN LVL: 0.7 ng/mL — AB (ref 0.8–2.0)

## 2016-01-19 MED ORDER — SACUBITRIL-VALSARTAN 24-26 MG PO TABS: 1.0000 | ORAL_TABLET | Freq: Two times a day (BID) | ORAL | Status: DC

## 2016-01-19 NOTE — Progress Notes (Signed)
Patient ID: Marcus Bates, male   DOB: 1956/08/23, 60 y.o.   MRN: [id number]    Advanced Heart Failure Clinic Note   PCP: Marcus Bates Primary Cardiologist: Marcus Bates  HPI:  Marcus Bates is a 60 year old male with no significant PMHx who was admitted in August 2016 for acute systolic HF complicated by cardiogenic shock and RLL PE.  Echo 05/14/15 showed EF 10-15% with grade 3 diastolic dysfunction and LV mural thrombus. Severe RV dysfunction.  Cardiac cath 05/02/15 showed minimal nonobstructive CAD. RHC cath on 8/31 c/w low output. Was treated with milrinone which was eventually weaned off. HF med titration limited by low BP.  He developed a drug rash in the hospital. Initially thought to be due to spironolactone but persisted and then felt to be related to warfarin, transitioned to Eliquis. Discharge weight 141 pounds.  He returns today for HF follow up. At last visit discussed ICD but pt wished to continue to hold off. Also increase losartan at last visit. He states he feels ok overall.  Has occasional chest tightness when he gets home in the evenings that is relieved by advair.  He continues to struggle with getting rest.  He sleeps, but wakes up feeling like he hasn't slept at all.  Has stopped taking lasix, has been having frequent urination (every 30 minutes for the past month). Weight at home 146-148. No DOE on flat ground. No orthopnea. Still working full time on second shift at [employer]. No melena or BRBPR.   RHC 05/07/2015 RA = 9 RV = 39/5/12 PA = 41/20 (28) PCW = 18 Fick cardiac output/index = 2.6/1.4 Therms cardiac output/index = 3.6/2.0 PVR = 3.8 WU FA sat = 98% PA sat = 54%, 57  Cardiac MRI 05/05/15 1. Severely dilated left and right ventricle with severe biventricular systolic impairment. LVEF 13%  Diffuse mid wall late gadolinium enhancement in the myocardium of the left and right ventricle and in the RV attachment points to the LV consistent with dilated cardiomyopathy with biventricular  involvement and decompensated heart failure. There is no evidence of inflammatory or infiltrative cardiomyopathy. 2. Severely dilated left atrium and moderately dilated right atrium. 3. Severe mitral and moderate tricuspid regurgitation. 4. Dilated main pulmonary artery consistent with pulmonary hypertension. There is severe swirling of the blood in the right ventricle suspicious of the pre-thrombotic state.   ROS: All systems negative except as listed in HPI, PMH and Problem List.  SH:  Social History   Social History  . Marital Status: Single    Spouse Name: N/A  . Number of Children: N/A  . Years of Education: N/A   Occupational History  .      Tools   Social History Main Topics  . Smoking status: Never Smoker   . Smokeless tobacco: Never Used  . Alcohol Use: No  . Drug Use: No  . Sexual Activity: Not on file   Other Topics Concern  . Not on file   Social History Narrative    FH:  Family History  Problem Relation Age of Onset  . Heart disease Other   . Pulmonary embolism Neg Hx   . CAD Maternal Grandfather     MI in his 75s maybe?  . Stroke Paternal Grandfather   . CAD Maternal Aunt     bypass at 39    Past Medical History  Diagnosis Date  . Asthma   . Anxiety   . Depression   . Insomnia   . Restless leg syndrome   .  Severe mitral insufficiency     a. 04/2015 Echo: Sev MR.  Marland Kitchen NICM (nonischemic cardiomyopathy) (HCC)     a. 04/2015 Echo: EF 10-15%, sev diff HK.  . Chronic combined systolic and diastolic CHF (congestive heart failure) (HCC)     a. 04/2015 Echo: EF 10-15%, sev diff HK, Gr 3 DD, sev MR, sev dil LA, dilated RV, mod dil RA.  . Non-obstructive CAD     a. 04/2015 Cath: LM nl, LAD 34m, LCX 20ost, RCA 20d.  . Acute deep vein thrombosis (DVT) of popliteal vein of right lower extremity (HCC)     a. 04/2015 -> Xarelto  . Pulmonary embolism on right (HCC)     a. 04/2015 -> Xarelto  . NSVT (nonsustained ventricular tachycardia) (HCC)   . LV (left  ventricular) mural thrombus (HCC)     a. 04/2015 Echo: possible laminated echodense area on inferior wall, cannot exclude laminated thrombus.    Current Outpatient Prescriptions  Medication Sig Dispense Refill  . acetaminophen (TYLENOL) 500 MG tablet Take 500 mg by mouth every 6 (six) hours as needed (calm nerves).    Marland Kitchen amphetamine-dextroamphetamine (ADDERALL) 10 MG tablet Take 2.5 mg by mouth daily with breakfast.    . apixaban (ELIQUIS) 5 MG TABS tablet Take 1 tablet (5 mg total) by mouth 2 (two) times daily. 60 tablet 6  . carvedilol (COREG) 3.125 MG tablet Take 1 tablet (3.125 mg total) by mouth 2 (two) times daily with a meal. 60 tablet 3  . digoxin (LANOXIN) 0.25 MG tablet TAKE 1 TABLET (0.25 MG TOTAL) BY MOUTH DAILY. 30 tablet 3  . Fluticasone-Salmeterol (ADVAIR) 250-50 MCG/DOSE AEPB Inhale 1 puff into the lungs 2 (two) times daily as needed (SOB, wheezing). Reported on 11/10/2015    . gabapentin (NEURONTIN) 600 MG tablet Take 300 mg by mouth daily as needed (neuropathy).     . Guarana 1000 MG TABS Take 1,000 mg by mouth daily.    . iron polysaccharides (NIFEREX) 150 MG capsule Take 150 mg by mouth daily.    . ivabradine (CORLANOR) 5 MG TABS tablet Take 1 tablet (5 mg total) by mouth 2 (two) times daily with a meal. 60 tablet 6  . losartan (COZAAR) 50 MG tablet Take 1 tablet (50 mg total) by mouth 2 (two) times daily. 30 tablet 3  . magnesium oxide (MAG-OX) 400 MG tablet Take 100 mg by mouth daily.    . Multiple Vitamins-Minerals (MULTIVITAMIN WITH MINERALS) tablet Take 1 tablet by mouth daily.    . Omega-3 Fatty Acids (FISH OIL) 1000 MG CAPS Take 1,000 mg by mouth daily. Reported on 11/10/2015    . PASSION FLOWER-VALERIAN PO Take 1 tablet by mouth daily.    . vitamin A 70350 UNIT capsule Take 25,000 Units by mouth 4 (four) times a week.     . vitamin E 100 UNIT capsule Take 100 Units by mouth 3 (three) times a week.    Marland Kitchen albuterol (PROVENTIL HFA;VENTOLIN HFA) 108 (90 BASE) MCG/ACT inhaler  Inhale 2 puffs into the lungs every 4 (four) hours as needed for wheezing or shortness of breath. 1 Inhaler 2   No current facility-administered medications for this encounter.    Filed Vitals:   01/19/16 1520  BP: 122/72  Pulse: 67  Weight: 151 lb (68.493 kg)  SpO2: 99%    Wt Readings from Last 3 Encounters:  01/19/16 151 lb (68.493 kg)  11/10/15 151 lb 4 oz (68.607 kg)  09/10/15 150 lb 4 oz (68.153  kg)    PHYSICAL EXAM: General:  Well appearing. No resp difficulty HEENT: normal Neck: supple. JVP 7. Carotids 2+ bilaterally; no bruits. No thyromegaly or nodule noted Cor: PMI laterally displaced.  RRR, no M/G/R Lungs: CTAB, normal effort Abdomen: soft, non-tender, non-distended, no HSM. No bruits or masses. +BS  Extremities: no cyanosis, clubbing, rash, no edema. Varicose veins bilateral LEs   ASSESSMENT & PLAN: 1. Chronic systolic HF with severe biventricular dysfunction EF 10-15% due to NICM. Newly diagnosed 8/16. Echo 09/10/15 with EF 25%.  - NYHA II. Volume status stable. Can take lasix 20 mg prn, will likely not need with Entresto.  - Continue carvedilol 3.125 mg BID - Stop losartan. Switch to [facility] 24/26. BMET today.  - Continue digoxin 0.25 mg daily. Occasionally has "stroke effect" in his vision.  Last dig level 05/2015. Will recheck today.  - Continue corlanor 5 mg BID - Follow up in 1 month with Echo.   2. RLL pulmonary embolism - on eliquis. No bleeding problems.  3. PAF - Regular rhythm. Continue eliquis twice a day.  4. Warfarin and ASA allergies  5. Mild nonobstructive CAD by cath 04/2015 6. Severe MR 7. LV mural thrombus- on eliquis. LV thrombus resolved on last echo  Graciella Freer, PA-C  3:53 PM    Patient seen and examined with Otilio Saber, PA-C. We discussed all aspects of the encounter. I agree with the assessment and plan as stated above.   HF continues to improve. NYHA II. Volume status looks good. Will switch losartan to Entresto. Watch  BP closely. Will need repeat echo at next visit. If EF <= 35% will need ICD.   Selene Peltzer,MD 11:30 PM

## 2016-01-19 NOTE — Patient Instructions (Signed)
Routine lab work today. Will notify you of abnormal results, otherwise no news is good news!  STOP Losartan.  START Entresto 24/26 mg tablet twice daily on Wednesday.  Return to clinic in 1 month with echocardiogram and follow up with Dr. Gala Romney.  Do the following things EVERYDAY: 1) Weigh yourself in the morning before breakfast. Write it down and keep it in a log. 2) Take your medicines as prescribed 3) Eat low salt foods-Limit salt (sodium) to 2000 mg per day.  4) Stay as active as you can everyday 5) Limit all fluids for the day to less than 2 liters

## 2016-01-19 NOTE — Progress Notes (Signed)
Advanced Heart Failure Medication Review by a Pharmacist  Does the patient  feel that his/her medications are working for him/her?  yes  Has the patient been experiencing any side effects to the medications prescribed?  Notices "strobe-like effect" when he moves his head, most recently experienced last night  Does the patient measure his/her own blood pressure or blood glucose at home?  no   Does the patient have any problems obtaining medications due to transportation or finances?   no  Understanding of regimen: fair Understanding of indications: good Potential of compliance: fair Patient understands to avoid NSAIDs. Patient understands to avoid decongestants.  Issues to address at subsequent visits: strobe-like visual effects, herbal supplement use, self-titration of several meds   Pharmacist comments: Marcus Bates is a 60 yo man here for f/u in HF clinic. Pt states usually maintains adherence to medications and infrequently misses doses. Missed apixaban evening dose last night. Works second shift and remembers to take AM doses, but sometimes forgets PM doses if he's out for extended period of time. Has stopped taking his furosemide a few weeks ago because it made him go to the restroom every 30 min. Since being off furosemide, still has stable wt around 146-148 lbs each morning. Denies any fluid accumulation in abdomen or extremities. Did endorse breathing difficulty and chest tightness before going to bed, however he attributes it more to asthma than heart disease. Denies having any orthopnea or PND.  On digoxin and ivabradine, mentions having "strobe-like effects" with his vision, denies blurriness. Last digoxin level 0.3 in 05/2015. Will get BMET and digoxin level today. Also having issues sleeping and mentions that iron supplementation gives him too much energy and makes him too jittery to sleep. On many herbal supplements, including Guarana. Doesn't like taking gabapentin qhs because it  makes him groggy when he wakes up, will take it every once in a while if he feels that he should. Also likes to self titrate several of his medications. Does not titrate apixaban, carvedilol, digoxin, or ivabradine.   Marcus Bates, [place].D., BCPS PGY2 Cardiology Pharmacy Resident Pager: [phone]  Time with patient: 15 min Preparation and documentation time: 10 min Total time: 25 min

## 2016-01-23 ENCOUNTER — Other Ambulatory Visit (HOSPITAL_COMMUNITY): Payer: Self-pay | Admitting: Internal Medicine

## 2016-01-26 ENCOUNTER — Other Ambulatory Visit (HOSPITAL_COMMUNITY): Payer: Self-pay | Admitting: Internal Medicine

## 2016-01-29 ENCOUNTER — Telehealth (HOSPITAL_COMMUNITY): Payer: Self-pay | Admitting: Pharmacist

## 2016-01-29 NOTE — Telephone Encounter (Signed)
Entresto 24-26 mg BID PA approved by [employer] through 09/05/38.   Tyler Deis. Bonnye Fava, PharmD, BCPS, CPP Clinical Pharmacist Pager: [phone] Phone: [phone] 01/29/2016 12:55 PM

## 2016-02-16 DIAGNOSIS — G2581 Restless legs syndrome: Secondary | ICD-10-CM | POA: Diagnosis not present

## 2016-02-16 DIAGNOSIS — I5022 Chronic systolic (congestive) heart failure: Secondary | ICD-10-CM | POA: Diagnosis not present

## 2016-02-16 DIAGNOSIS — Z0000 Encounter for general adult medical examination without abnormal findings: Secondary | ICD-10-CM | POA: Diagnosis not present

## 2016-02-16 DIAGNOSIS — Z125 Encounter for screening for malignant neoplasm of prostate: Secondary | ICD-10-CM | POA: Diagnosis not present

## 2016-02-16 DIAGNOSIS — J45909 Unspecified asthma, uncomplicated: Secondary | ICD-10-CM | POA: Diagnosis not present

## 2016-02-16 DIAGNOSIS — Z13220 Encounter for screening for lipoid disorders: Secondary | ICD-10-CM | POA: Diagnosis not present

## 2016-02-16 DIAGNOSIS — D509 Iron deficiency anemia, unspecified: Secondary | ICD-10-CM | POA: Diagnosis not present

## 2016-02-19 ENCOUNTER — Other Ambulatory Visit (HOSPITAL_COMMUNITY): Payer: Self-pay | Admitting: Internal Medicine

## 2016-02-23 ENCOUNTER — Other Ambulatory Visit (HOSPITAL_COMMUNITY): Payer: Self-pay | Admitting: *Deleted

## 2016-02-23 MED ORDER — APIXABAN 5 MG PO TABS
5.0000 mg | ORAL_TABLET | Freq: Two times a day (BID) | ORAL | Status: DC
Start: 2016-02-23 — End: 2016-09-01

## 2016-02-23 MED ORDER — IVABRADINE HCL 5 MG PO TABS: ORAL_TABLET | ORAL | Status: DC

## 2016-02-23 MED ORDER — DIGOXIN 250 MCG PO TABS: ORAL_TABLET | ORAL | Status: DC

## 2016-02-24 ENCOUNTER — Other Ambulatory Visit (HOSPITAL_COMMUNITY): Payer: Self-pay | Admitting: *Deleted

## 2016-02-24 MED ORDER — DIGOXIN 250 MCG PO TABS: ORAL_TABLET | ORAL | Status: DC

## 2016-03-08 ENCOUNTER — Other Ambulatory Visit (HOSPITAL_COMMUNITY): Payer: Self-pay | Admitting: Internal Medicine

## 2016-03-08 ENCOUNTER — Ambulatory Visit (HOSPITAL_COMMUNITY)
Admission: RE | Admit: 2016-03-08 | Discharge: 2016-03-08 | Disposition: A | Discharge status: Home / Self Care | Payer: BLUE CROSS/BLUE SHIELD | Source: Ambulatory Visit | Attending: Family Medicine | Admitting: Family Medicine

## 2016-03-08 ENCOUNTER — Ambulatory Visit (HOSPITAL_BASED_OUTPATIENT_CLINIC_OR_DEPARTMENT_OTHER)
Admission: RE | Admit: 2016-03-08 | Discharge: 2016-03-08 | Disposition: A | Discharge status: Home / Self Care | Payer: BLUE CROSS/BLUE SHIELD | Source: Ambulatory Visit | Attending: Internal Medicine | Admitting: Internal Medicine

## 2016-03-08 VITALS — BP 118/68 | HR 53 | Wt 149.5 lb

## 2016-03-08 DIAGNOSIS — G2581 Restless legs syndrome: Secondary | ICD-10-CM | POA: Diagnosis not present

## 2016-03-08 DIAGNOSIS — I5022 Chronic systolic (congestive) heart failure: Secondary | ICD-10-CM | POA: Diagnosis not present

## 2016-03-08 DIAGNOSIS — I213 ST elevation (STEMI) myocardial infarction of unspecified site: Secondary | ICD-10-CM

## 2016-03-08 DIAGNOSIS — J45909 Unspecified asthma, uncomplicated: Secondary | ICD-10-CM | POA: Diagnosis not present

## 2016-03-08 DIAGNOSIS — F419 Anxiety disorder, unspecified: Secondary | ICD-10-CM | POA: Diagnosis not present

## 2016-03-08 DIAGNOSIS — Z79899 Other long term (current) drug therapy: Secondary | ICD-10-CM | POA: Insufficient documentation

## 2016-03-08 DIAGNOSIS — I513 Intracardiac thrombosis, not elsewhere classified: Secondary | ICD-10-CM

## 2016-03-08 DIAGNOSIS — I480 Paroxysmal atrial fibrillation: Secondary | ICD-10-CM | POA: Diagnosis not present

## 2016-03-08 DIAGNOSIS — Z7901 Long term (current) use of anticoagulants: Secondary | ICD-10-CM | POA: Diagnosis not present

## 2016-03-08 DIAGNOSIS — I509 Heart failure, unspecified: Secondary | ICD-10-CM | POA: Diagnosis present

## 2016-03-08 DIAGNOSIS — I517 Cardiomegaly: Secondary | ICD-10-CM | POA: Insufficient documentation

## 2016-03-08 DIAGNOSIS — G4700 Insomnia, unspecified: Secondary | ICD-10-CM | POA: Insufficient documentation

## 2016-03-08 DIAGNOSIS — I429 Cardiomyopathy, unspecified: Secondary | ICD-10-CM | POA: Diagnosis not present

## 2016-03-08 DIAGNOSIS — F329 Major depressive disorder, single episode, unspecified: Secondary | ICD-10-CM | POA: Diagnosis not present

## 2016-03-08 LAB — ECHOCARDIOGRAM LIMITED
CHL CUP DOP CALC LVOT VTI: 13.9 cm
CHL CUP STROKE VOLUME: 55 mL
E/e' ratio: 7.75
EWDT: 317 ms
FS: 13 % — AB (ref 28–44)
IV/PV OW: 1.08
LA ID, A-P, ES: 40 mm
LA diam end sys: 40 mm
LA vol A4C: 41.9 ml
LA vol index: 32.1 mL/m2
LADIAMINDEX: 2.2 cm/m2
LAVOL: 58.5 mL
LV E/e' medial: 7.75
LV PW d: 7.87 mm — AB (ref 0.6–1.1)
LV SIMPSON'S DISK: 39
LV TDI E'LATERAL: 4.57
LV e' LATERAL: 4.57 cm/s
LV sys vol index: 48 mL/m2
LVDIAVOL: 142 mL (ref 62–150)
LVDIAVOLIN: 78 mL/m2
LVEEAVG: 7.75
LVOT SV: 63 mL
LVOT area: 4.52 cm2
LVOT peak vel: 73.2 cm/s
LVOTD: 24 mm
LVSYSVOL: 87 mL — AB (ref 21–61)
MV Dec: 317
MV pk E vel: 35.4 m/s
MVPKAVEL: 59.2 m/s
TDI e' medial: 4.57

## 2016-03-08 LAB — BASIC METABOLIC PANEL
Anion gap: 8 (ref 5–15)
BUN: 16 mg/dL (ref 6–20)
CHLORIDE: 106 mmol/L (ref 101–111)
CO2: 23 mmol/L (ref 22–32)
CREATININE: 0.85 mg/dL (ref 0.61–1.24)
Calcium: 9.4 mg/dL (ref 8.9–10.3)
GFR calc Af Amer: >60 mL/min (ref >60)
GFR calc non Af Amer: >60 mL/min (ref >60)
GLUCOSE: 103 mg/dL — AB (ref 65–99)
POTASSIUM: 4.7 mmol/L (ref 3.5–5.1)
Sodium: 137 mmol/L (ref 135–145)

## 2016-03-08 LAB — CBC
HEMATOCRIT: 45.8 % (ref 39.0–52.0)
Hemoglobin: 15.2 g/dL (ref 13.0–17.0)
MCH: 31 pg (ref 26.0–34.0)
MCHC: 33.2 g/dL (ref 30.0–36.0)
MCV: 93.3 fL (ref 78.0–100.0)
Platelets: 201 10*3/uL (ref 150–400)
RBC: 4.91 MIL/uL (ref 4.22–5.81)
RDW: 13.3 % (ref 11.5–15.5)
WBC: 7 10*3/uL (ref 4.0–10.5)

## 2016-03-08 MED ORDER — CARVEDILOL 3.125 MG PO TABS: 3.1250 mg | ORAL_TABLET | Freq: Two times a day (BID) | ORAL | Status: DC

## 2016-03-08 NOTE — Progress Notes (Signed)
Patient ID: Marcus Bates, male   DOB: April 22, 1956, 60 y.o.   MRN: [id number]    Advanced Heart Failure Clinic Note   PCP: Fulton Mole Primary Cardiologist: Kerin Kren  HPI: Marcus Bates is a 60 year old male with no significant PMHx who was admitted in August 2016 for acute systolic HF complicated by cardiogenic shock and RLL PE.  Echo 05/14/15 showed EF 10-15% with grade 3 diastolic dysfunction and LV mural thrombus. Severe RV dysfunction.  Cardiac cath 05/02/15 showed minimal nonobstructive CAD. RHC cath on 8/31 c/w low output. Was treated with milrinone which was eventually weaned off. HF med titration limited by low BP.  He developed a drug rash in the hospital. Initially thought to be due to spironolactone but persisted and then felt to be related to warfarin, transitioned to Eliquis. Discharge weight 141 pounds.  He returns today for HF follow up. Overall feeling ok. Denies SOB/PND/Orthopnea/CP. Had bright red bowel movement yesterday. Denies dizziness. Weight at home 146-147 pounds.  Still working full time on second shift at [employer]. Taking all medications.   RHC 05/07/2015 RA = 9 RV = 39/5/12 PA = 41/20 (28) PCW = 18 Fick cardiac output/index = 2.6/1.4 Therms cardiac output/index = 3.6/2.0 PVR = 3.8 WU FA sat = 98% PA sat = 54%, 57  Cardiac MRI 05/05/15 1. Severely dilated left and right ventricle with severe biventricular systolic impairment. LVEF 13%  Diffuse mid wall late gadolinium enhancement in the myocardium of the left and right ventricle and in the RV attachment points to the LV consistent with dilated cardiomyopathy with biventricular involvement and decompensated heart failure. There is no evidence of inflammatory or infiltrative cardiomyopathy. 2. Severely dilated left atrium and moderately dilated right atrium. 3. Severe mitral and moderate tricuspid regurgitation. 4. Dilated main pulmonary artery consistent with pulmonary hypertension. There is severe swirling of the blood in  the right ventricle suspicious of the pre-thrombotic state.  ECHO 03/08/2016: No evidence of LV thrombus. EF 30-35%. RV ok.   ROS: All systems negative except as listed in HPI, PMH and Problem List.  SH:  Social History   Social History  . Marital Status: Single    Spouse Name: N/A  . Number of Children: N/A  . Years of Education: N/A   Occupational History  .      Tools   Social History Main Topics  . Smoking status: Never Smoker   . Smokeless tobacco: Never Used  . Alcohol Use: No  . Drug Use: No  . Sexual Activity: Not on file   Other Topics Concern  . Not on file   Social History Narrative    FH:  Family History  Problem Relation Age of Onset  . Heart disease Other   . Pulmonary embolism Neg Hx   . CAD Maternal Grandfather     MI in his 74s maybe?  . Stroke Paternal Grandfather   . CAD Maternal Aunt     bypass at 62    Past Medical History  Diagnosis Date  . Asthma   . Anxiety   . Depression   . Insomnia   . Restless leg syndrome   . Severe mitral insufficiency     a. 04/2015 Echo: Sev MR.  Marland Kitchen NICM (nonischemic cardiomyopathy) (HCC)     a. 04/2015 Echo: EF 10-15%, sev diff HK.  . Chronic combined systolic and diastolic CHF (congestive heart failure) (HCC)     a. 04/2015 Echo: EF 10-15%, sev diff HK, Gr 3 DD, sev MR, sev dil  LA, dilated RV, mod dil RA.  . Non-obstructive CAD     a. 04/2015 Cath: LM nl, LAD 61m, LCX 20ost, RCA 20d.  . Acute deep vein thrombosis (DVT) of popliteal vein of right lower extremity (HCC)     a. 04/2015 -> Xarelto  . Pulmonary embolism on right (HCC)     a. 04/2015 -> Xarelto  . NSVT (nonsustained ventricular tachycardia) (HCC)   . LV (left ventricular) mural thrombus (HCC)     a. 04/2015 Echo: possible laminated echodense area on inferior wall, cannot exclude laminated thrombus.    Current Outpatient Prescriptions  Medication Sig Dispense Refill  . acetaminophen (TYLENOL) 500 MG tablet Take 500 mg by mouth every 6 (six) hours  as needed (calm nerves).    Marland Kitchen albuterol (PROVENTIL HFA;VENTOLIN HFA) 108 (90 BASE) MCG/ACT inhaler Inhale 2 puffs into the lungs every 4 (four) hours as needed for wheezing or shortness of breath. 1 Inhaler 2  . amphetamine-dextroamphetamine (ADDERALL) 10 MG tablet Take 2.5 mg by mouth daily with breakfast.    . apixaban (ELIQUIS) 5 MG TABS tablet Take 1 tablet (5 mg total) by mouth 2 (two) times daily. 180 tablet 3  . carvedilol (COREG) 3.125 MG tablet Take 1 tablet (3.125 mg total) by mouth 2 (two) times daily with a meal. 60 tablet 3  . digoxin (LANOXIN) 0.25 MG tablet TAKE 1 TABLET (0.25 MG TOTAL) BY MOUTH DAILY. 90 tablet 3  . Fluticasone-Salmeterol (ADVAIR) 250-50 MCG/DOSE AEPB Inhale 1 puff into the lungs 2 (two) times daily as needed (SOB, wheezing). Reported on 11/10/2015    . gabapentin (NEURONTIN) 600 MG tablet Take 300 mg by mouth daily as needed (neuropathy).     . Guarana 1000 MG TABS Take 1,000 mg by mouth daily.    . iron polysaccharides (NIFEREX) 150 MG capsule Take 150 mg by mouth daily.    . ivabradine (CORLANOR) 5 MG TABS tablet TAKE 1 TABLET TWICE A DAY WITH A MEAL 180 tablet 3  . magnesium oxide (MAG-OX) 400 MG tablet Take 100 mg by mouth daily.    . Multiple Vitamins-Minerals (MULTIVITAMIN WITH MINERALS) tablet Take 1 tablet by mouth daily.    . Omega-3 Fatty Acids (FISH OIL) 1000 MG CAPS Take 1,000 mg by mouth daily. Reported on 11/10/2015    . PASSION FLOWER-VALERIAN PO Take 1 tablet by mouth daily.    . sacubitril-valsartan (ENTRESTO) 24-26 MG Take 1 tablet by mouth 2 (two) times daily. 60 tablet 6  . vitamin A 16109 UNIT capsule Take 25,000 Units by mouth 4 (four) times a week.     . vitamin E 100 UNIT capsule Take 100 Units by mouth 3 (three) times a week.     No current facility-administered medications for this encounter.    Filed Vitals:   03/08/16 1526  BP: 118/68  Pulse: 53  Weight: 149 lb 8 oz (67.813 kg)  SpO2: 98%    Wt Readings from Last 3 Encounters:    03/08/16 149 lb 8 oz (67.813 kg)  01/19/16 151 lb (68.493 kg)  11/10/15 151 lb 4 oz (68.607 kg)    PHYSICAL EXAM: General:  Well appearing. No resp difficulty HEENT: normal Neck: supple. JVP 5-6 . Carotids 2+ bilaterally; no bruits. No thyromegaly or nodule noted Cor: PMI laterally displaced.  RRR, no M/G/R Lungs: CTAB, normal effort Abdomen: soft, non-tender, non-distended, no HSM. No bruits or masses. +BS  Extremities: no cyanosis, clubbing, rash, no edema. Varicose veins bilateral LEs   ASSESSMENT &  PLAN: 1. Chronic systolic HF with severe biventricular dysfunction EF 10-15% due to NICM. Newly diagnosed 8/16. Echo 09/10/15 with EF 25%. Todays echo with evidence of recovery. EF now 30-35%. He does not want ICD. - NYHA II. Volume status stable. Can take lasix 20 mg prn, will likely not need with Entresto.  - Continue carvedilol 3.125 mg BID will not increase heart rate < 60.  - Continue  Entresto 24/26.  - Continue digoxin 0.25 mg daily. Last dig level 05/2015.  - Continue corlanor 5 mg BID 2. RLL pulmonary embolism - In setting of low EF. Treated ~ 1year. Stop eliquis. Check CBC.   3. PAF - Regular rhythm. Continue eliquis twice a day.  4. Warfarin and ASA allergies  5. Mild nonobstructive CAD by cath 04/2015 6. Severe MR 7. LV mural thrombus- LV thrombus resolved on today's echo. He has been treated over 3 months. EF now improving. Stop eliquis.  8. Melena-needs follow up with GI but he would like to hold off. Stop eliquis.   Follow up in month.  Tonye Becket, NP-C  3:48 PM   Patient seen and examined with Tonye Becket, NP. We discussed all aspects of the encounter. I agree with the assessment and plan as stated above.   Echo reviewed personally. EF continues to improve slowly. Now 30-35%. LV thrombus has resolved. He is NYHA I-II. Volume status ok. BP and HR too soft to titrate meds further at this point. Continue current regimen. With resolution of LV thrombus and improvement in EF I  think he can stop Eliquis, particularly with melena. Stressed to him the need to have colonoscopy +/- EGD but he refuses. Does not want ICD.   Sy Saintjean,MD 11:09 PM

## 2016-03-08 NOTE — Progress Notes (Signed)
Echocardiogram 2D Echocardiogram limited has been performed.  Marcus Bates 03/08/2016, 3:02 PM

## 2016-03-08 NOTE — Patient Instructions (Signed)
Labs today  We will contact you in 4 months to schedule your next appointment.  

## 2016-05-31 DIAGNOSIS — J329 Chronic sinusitis, unspecified: Secondary | ICD-10-CM | POA: Diagnosis not present

## 2016-05-31 DIAGNOSIS — G2581 Restless legs syndrome: Secondary | ICD-10-CM | POA: Diagnosis not present

## 2016-05-31 DIAGNOSIS — F329 Major depressive disorder, single episode, unspecified: Secondary | ICD-10-CM | POA: Diagnosis not present

## 2016-07-02 DIAGNOSIS — G4721 Circadian rhythm sleep disorder, delayed sleep phase type: Secondary | ICD-10-CM | POA: Diagnosis not present

## 2016-07-02 DIAGNOSIS — G4712 Idiopathic hypersomnia without long sleep time: Secondary | ICD-10-CM | POA: Diagnosis not present

## 2016-07-25 IMAGING — CT CT ABD-PELV W/ CM
2 of 5 series · 14 of 46 positions shown, 16 images · IV contrast (omnipaque)
Comparison: None.

CLINICAL DATA: Abnormal labs. Elevated AST and ALT. Right upper
quadrant mass. Nausea and vomiting for several weeks.

EXAM:
CT ABDOMEN AND PELVIS WITH CONTRAST
TECHNIQUE: Multidetector CT imaging of the abdomen and pelvis was performed
using the standard protocol following bolus administration of
intravenous contrast.
CONTRAST:  100 mL Omnipaque 300 IV

[Series 2: abd/pel with · axial · 0.74mm/px · z∈[+948,+1358]mm · 11 of 92 slices shown, 13 images]
[im 5/92  soft-tissue]
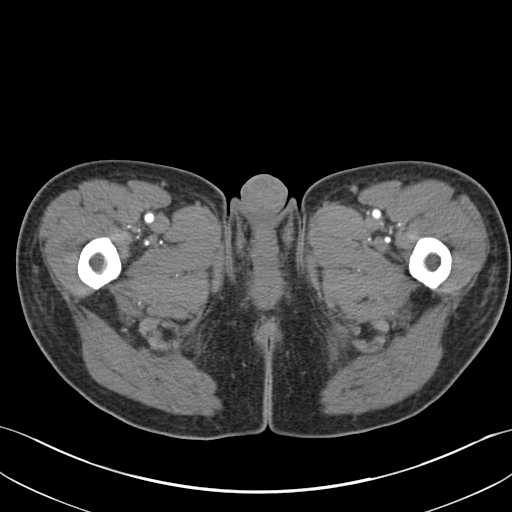
[im 5/92  bone]
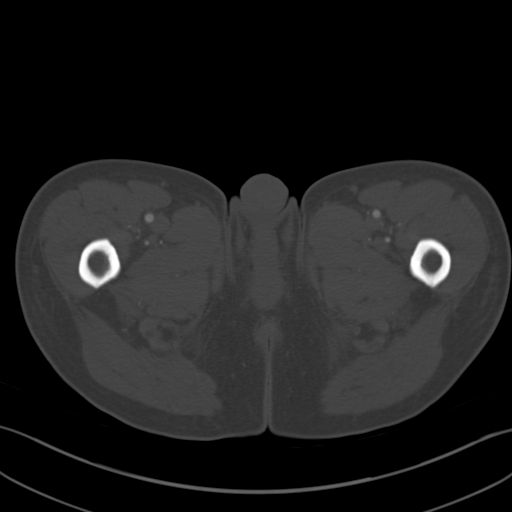
[im 15/92  soft-tissue]
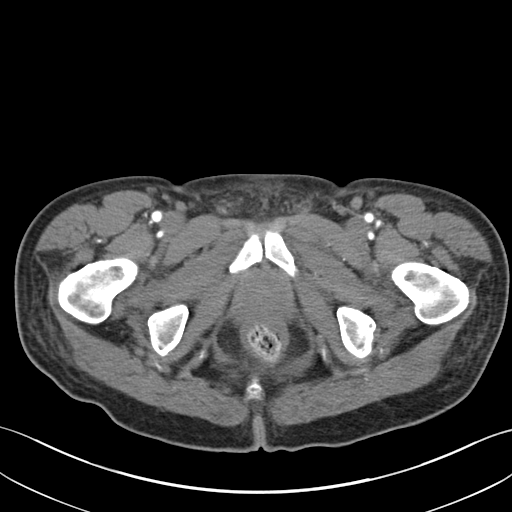
[im 24/92  soft-tissue]
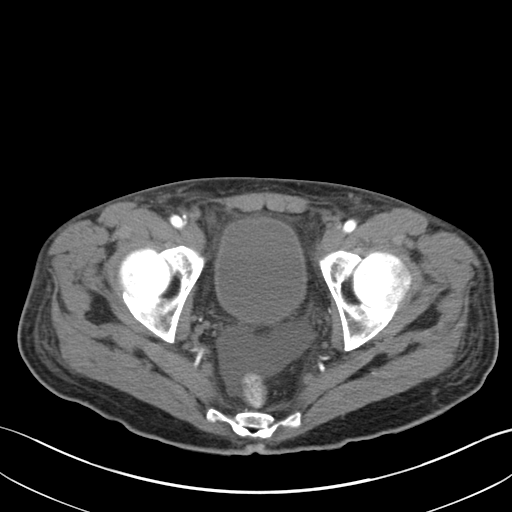
[im 29/92  soft-tissue]
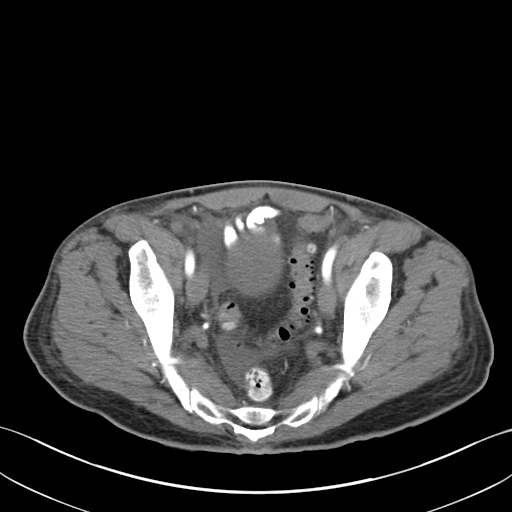
[im 39/92  soft-tissue]
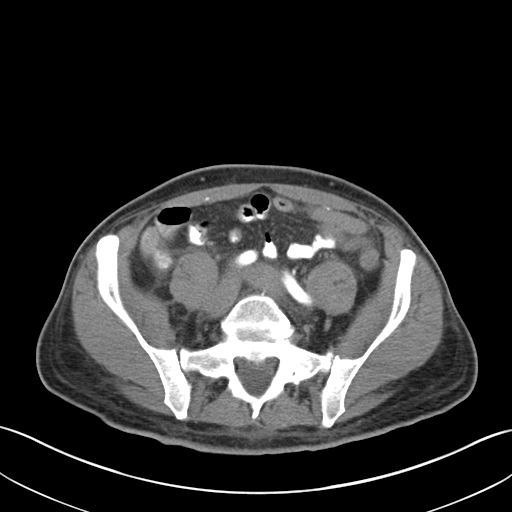
[im 48/92  soft-tissue]
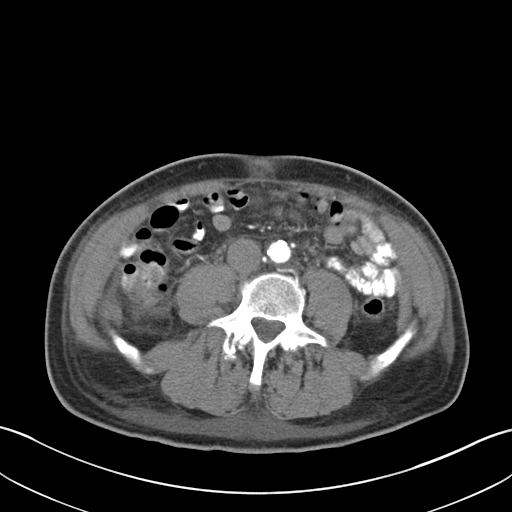
[im 53/92  soft-tissue]
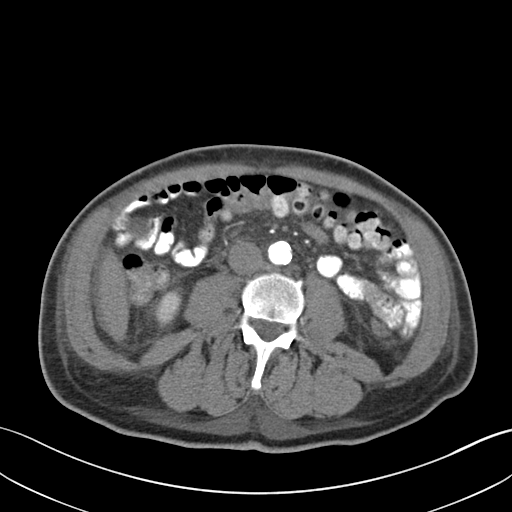
[im 63/92  soft-tissue]
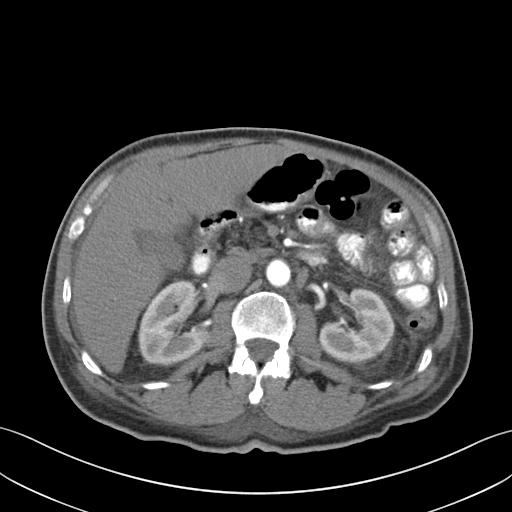
[im 68/92  soft-tissue]
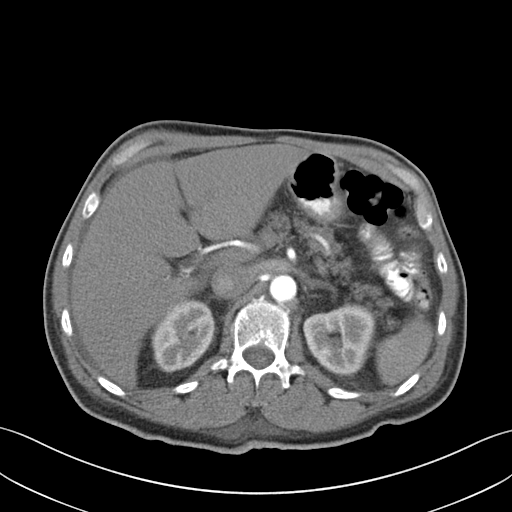
[im 68/92  bone]
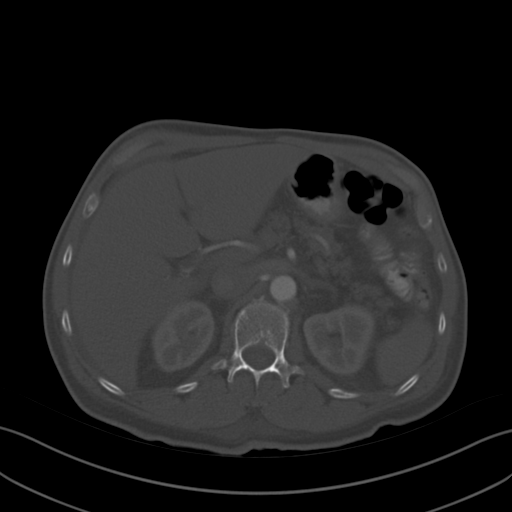
[im 77/92  soft-tissue]
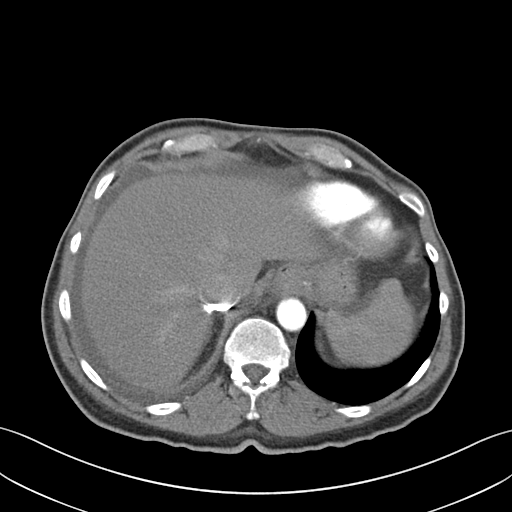
[im 87/92  soft-tissue]
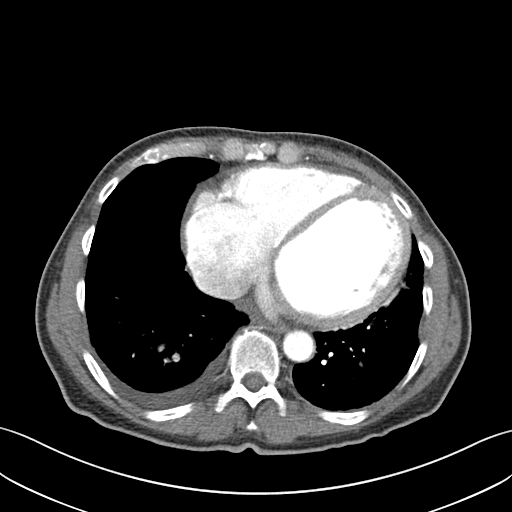

[Series 3: coronal a/|p · coronal · 0.66mm/px · 3 of 86 slices shown]
[im 29/86  soft-tissue]
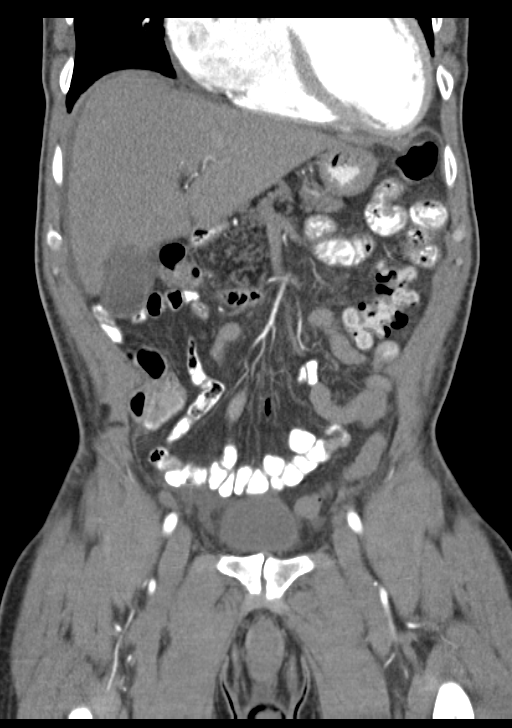
[im 38/86  soft-tissue]
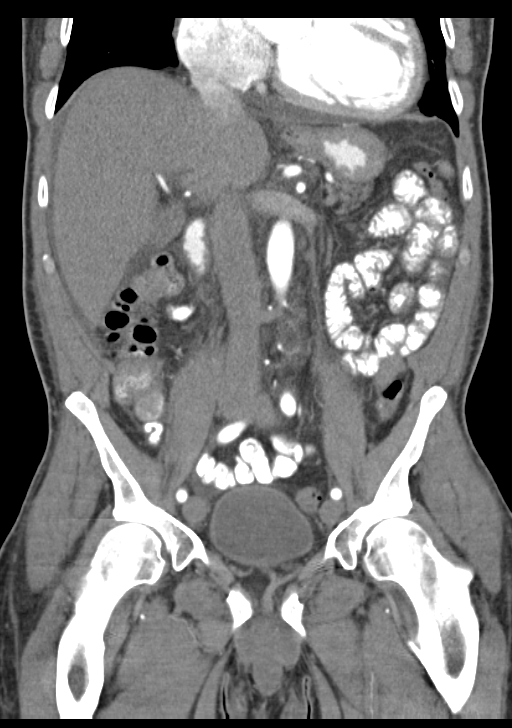
[im 48/86  soft-tissue]
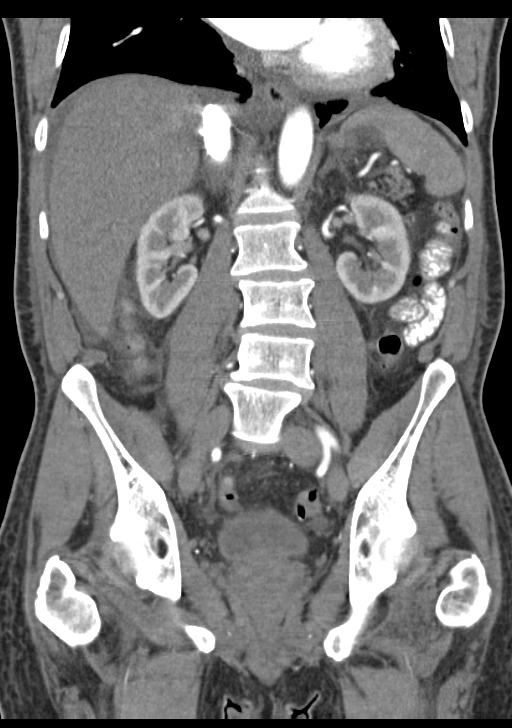

[14 of 46 positions shown; findings below may reference images not displayed]

FINDINGS: Lower chest: Multi chamber cardiomegaly. Small right effusion.
Adjacent ground-glass opacities in the right lower lobe. There is an
apparent filling defect within the right lower lobe pulmonary
artery. Minimal linear atelectasis in the lingula.

Liver: Contrast refluxing into the IVC and hepatic veins. Liver
appears normal in size. No evidence of focal hepatic lesion. No
definite morphologic findings of cirrhosis. There is a small amount
perihepatic ascites.

Hepatobiliary: Gallbladder physiologically distended, questionable
gallbladder wall thickening. No calcified gallstone. No biliary
dilatation, common bile duct measures 6 mm at the porta hepatis.

Pancreas: Fatty atrophy of the head and uncinate process. No focal
lesion. No ductal dilatation.

Spleen: Normal in size without focal lesion.

Adrenal glands: No nodule.

Kidneys: Symmetric renal enhancement. No hydronephrosis. No focal
lesion.

Stomach/Bowel: Stomach physiologically distended. There are no
dilated or thickened small bowel loops. Minimal thickening involving
the ascending colon. Small volume of colonic stool. Distal
diverticulosis without diverticulitis. The appendix is normal.

Vascular/Lymphatic: No retroperitoneal adenopathy. Abdominal aorta
is normal in caliber. Moderate atherosclerosis of the abdominal
aorta and its branches. No mesenteric or pelvic adenopathy.

Reproductive: Prostate gland is normal in size.

Bladder: Physiologically distended. Equivocal bladder wall
thickening.

Other: Small amount of intra-abdominal ascites primarily in the
perihepatic space, tracking in the right pericolic gutter and in the
pelvis. No free air or loculated intra-abdominal fluid collection.
Small fat containing umbilical hernia. There is mild whole body wall
and mesenteric edema.

Musculoskeletal: There are no acute or suspicious osseous
abnormalities. Degenerative change in the lumbar spine and at the
pubic symphysis.
IMPRESSION: 1. No evidence of focal right upper quadrant or intra-abdominal
mass.
2. Small right pleural effusion, small amount of intra-abdominal and
pelvic ascites, and whole body wall edema. Findings may be related
to liver dysfunction, however there are no CT morphologic findings
of cirrhosis. There is multi chamber cardiomegaly, and right heart
failure could produce a similar appearance. Suggestion of
gallbladder wall thickening is likely related to systemic process.
No biliary obstruction.
3. Incidental note of pulmonary embolus in a segmental right lower
lobe pulmonary artery.
4. Colonic diverticulosis without diverticulitis and
atherosclerosis.
Critical Value/emergent results were called by telephone at the time
of interpretation on 04/30/2015 at [DATE] to PA QARTLOSI RAPAPORT ,
who verbally acknowledged these results.

## 2016-07-26 IMAGING — DX DG CHEST 1V PORT
1 series · 1 of 1 positions shown · non-contrast
Comparison: CT abdomen/pelvis 1 day prior.

CLINICAL DATA: Shortness of breath.

EXAM:
PORTABLE CHEST - 1 VIEW

[chest ap]
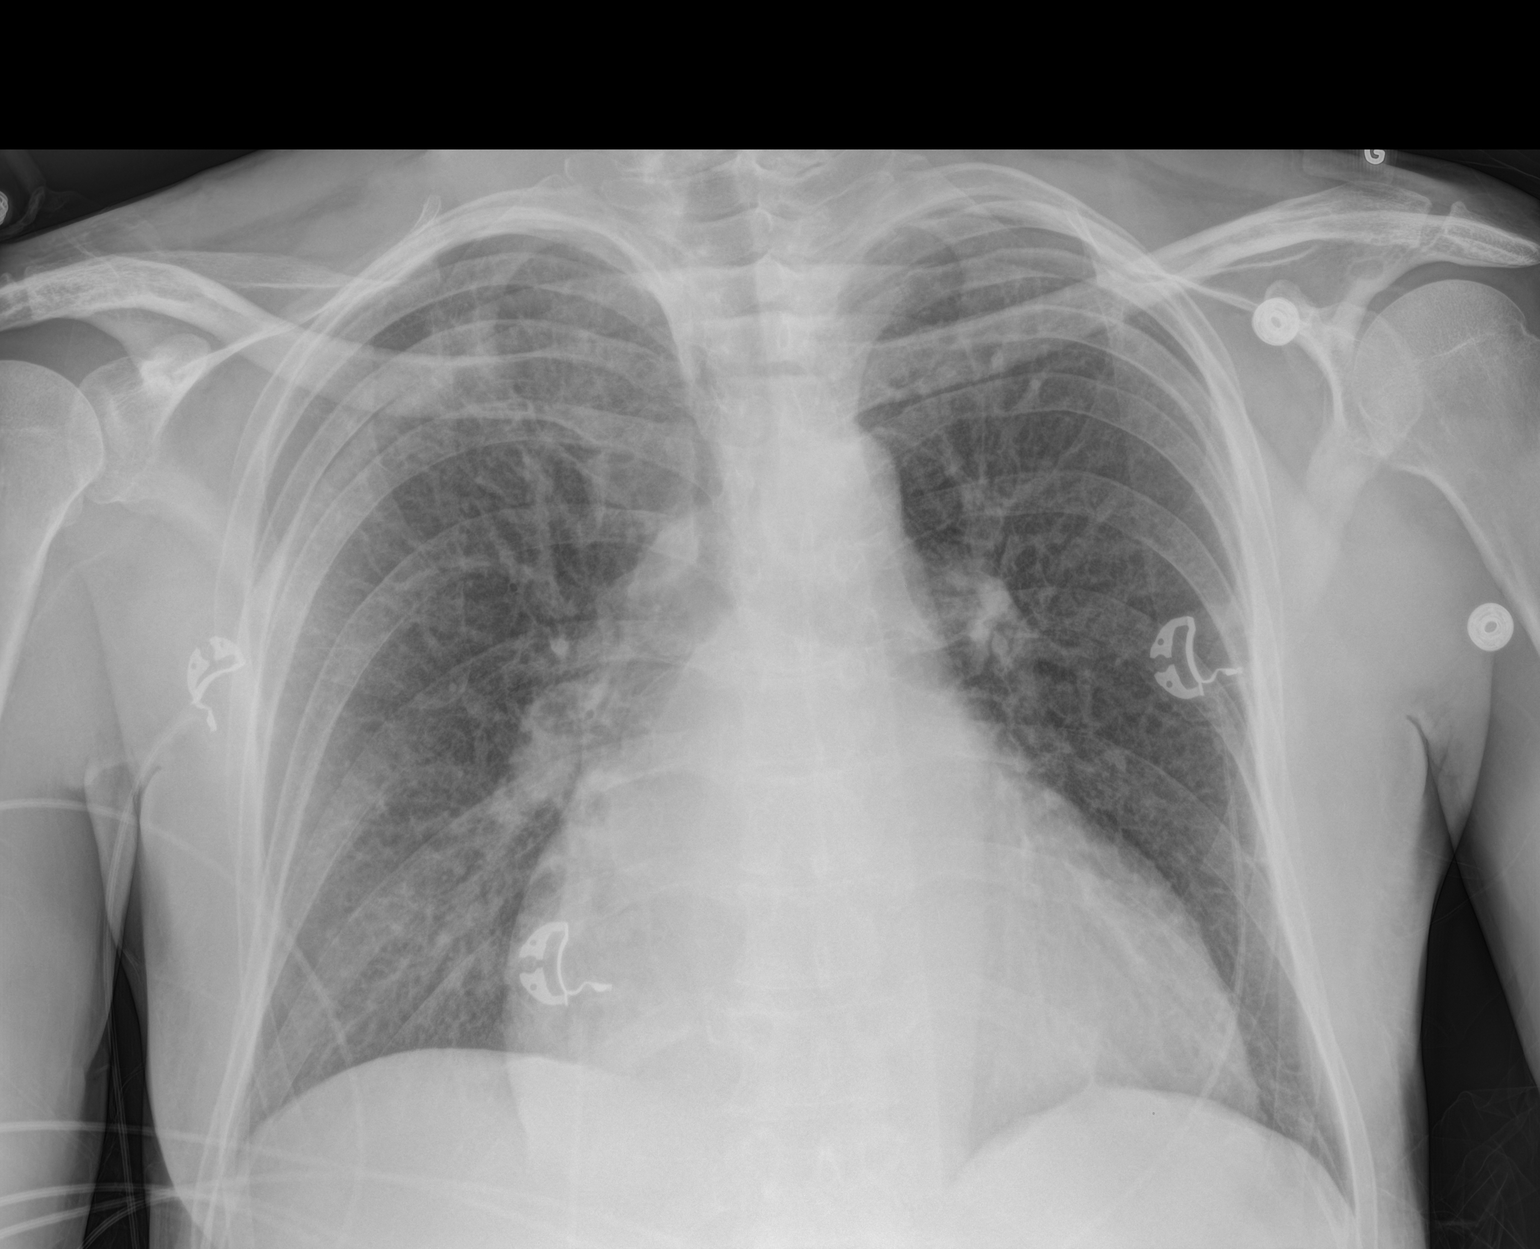

[1 of 1 positions shown; findings below may reference images not displayed]

FINDINGS: The heart is enlarged. Mild vascular congestion without overt edema.
Minimal atelectasis at the left lung base. No confluent airspace
disease. The small right pleural effusion on prior CT is not seen on
this portable AP view. No pneumothorax. No acute osseous
abnormality.
IMPRESSION: Cardiomegaly with mild vascular congestion.

## 2016-07-30 IMAGING — MR MR CARD MORPHOLOGY WO/W CM
11 of 13 series · 14 of 16 positions shown · IV contrast (25    Multihance)
Comparison: none

CLINICAL DATA: 58-year old male with new diagnosis of non-ischemic
cardiomyopathy. Evaluate for possible inflammatory and infiltrative
diseases.

EXAM:
CARDIAC MRI
TECHNIQUE: The patient was scanned on a 1.5 Tesla GE magnet. A dedicated
cardiac coil was used. Functional imaging was done using Fiesta
sequences. [DATE], and 4 chamber views were done to assess for RWMA's.
Modified Minor rule using a short axis stack was used to
calculate an ejection fraction on a dedicated work station using
Circle software. The patient received 23 cc of Multihance. After 10
minutes inversion recovery sequences were used to assess for
infiltration and scar tissue.
CONTRAST:  23 cc  of Multihance

[Series 3: bSSFP · sagittal · 8.0mm · 1.33mm/px · 1 of 14 slices shown (1 of 5)]
[im 1/14]
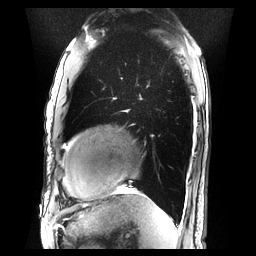

[Series 4: bSSFP · oblique · 8.0mm · 1.25mm/px · 1 of 20 slices shown (2 of 5)]
[im 1/20]
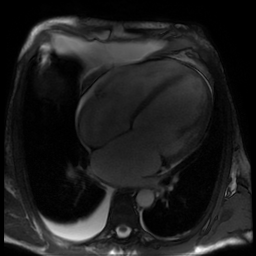

[Series 5: bSSFP · oblique · 8.0mm · 1.37mm/px · 4 of 360 slices shown (3 of 5)]
[im 1/360]
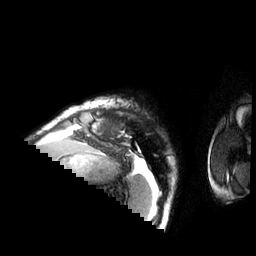
[im 120/360]
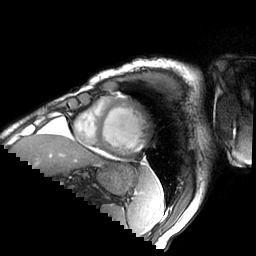
[im 240/360]
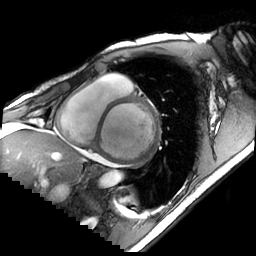
[im 360/360]
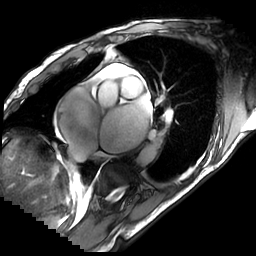

[Series 6: bSSFP · oblique · 8.0mm · 1.37mm/px · 1 of 180 slices shown (4 of 5)]
[im 1/180]
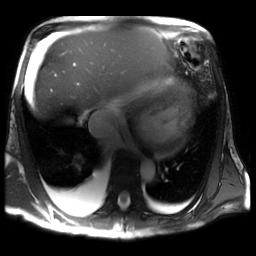

[Series 7: T2 · oblique · 8.0mm · 1.45mm/px · 1 of 60 slices shown]
[im 1/60]
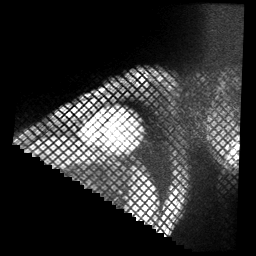

[Series 8: bSSFP · oblique · 8.0mm · 1.37mm/px · 1 of 60 slices shown (5 of 5)]
[im 1/60]
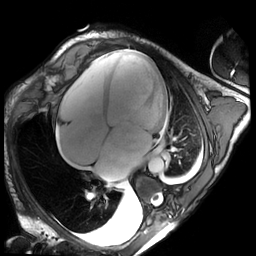

[Series 14: delayed ir prep · oblique · 8.0mm · 1.37mm/px · 1 of 10 slices shown (1 of 5)]
[im 1/10]
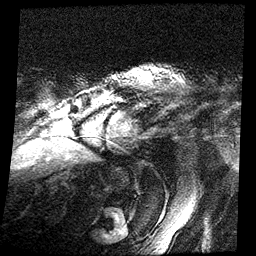

[Series 15: delayed ir prep · oblique · 8.0mm · 1.37mm/px · 1 of 5 slices shown (2 of 5)]
[im 1/5]
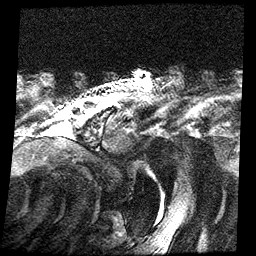

[Series 18: delayed ir prep · oblique · 8.0mm · 1.37mm/px · 1 of 10 slices shown (3 of 5)]
[im 1/10]
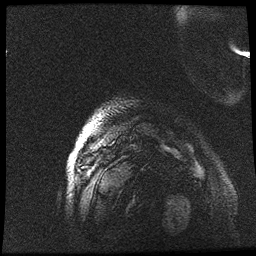

[Series 21: delayed ir prep · oblique · 8.0mm · 1.37mm/px · 1 of 9 slices shown (4 of 5)]
[im 1/9]
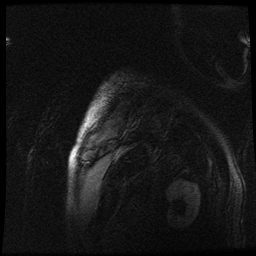

[Series 22: delayed ir prep · oblique · 8.0mm · 1.37mm/px · 1 of 9 slices shown (5 of 5)]
[im 1/9]
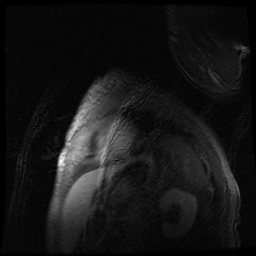

[14 of 16 positions shown; findings below may reference images not displayed]

FINDINGS: 1. Severely dilated left ventricle with normal wall thickness and
severely impaired systolic function (LVEF = 13%).

There is diffuse hypokinesis and paradoxical septal motion.

LVEDD:  76 mm

LVESD:  69 mm

LVEDV:  343 ml

LVESV:  298 ml

SV:  45 ml

CO:  3.5 L/minute

Myocardial mass 196 g

2. Severely dilated right ventricle with normal wall thickness and
severely impaired systolic function (RVEF = 12%) with diffuse
hypokinesis.

3. Severely dilated left atrium and moderately dilated right atrium.

4.  Severe mitral and moderate tricuspid regurgitation.

5. Normal size of the aortic root and ascending aorta. Dilated main
pulmonary artery consistent with pulmonary hypertension.

6. There is diffuse mid wall late gadolinium enhancement in the
interventricular septum, anterolateral walls and in the RV
myocardium.

7.  No thrombus was seen in the left ventricle.

8. There is severe swirling of the blood in the right ventricle
suspicious of the pre-thrombotic state.
IMPRESSION: 1. Severely dilated left and right ventricle with severe
biventricular systolic impairment.

Diffuse mid wall late gadolinium enhancement in the myocardium of
the left and right ventricle and in the RV attachment points to the
LV consistent with dilated cardiomyopathy with biventricular
involvement and decompensated heart failure.

There is no evidence of inflammatory or infiltrative cardiomyopathy.

2. Severely dilated left atrium and moderately dilated right atrium.

3. Severe mitral and moderate tricuspid regurgitation.

4. Dilated main pulmonary artery consistent with pulmonary
hypertension. There is severe swirling of the blood in the right
ventricle suspicious of the pre-thrombotic state.

Pateusz Surma

## 2016-07-31 IMAGING — RF DG SNIFF TEST
2 series · 5 of 5 positions shown · non-contrast
Comparison: None.

CLINICAL DATA: Interrupted sleeping with gasping for air and
snoring.

EXAM:
CHEST FLUOROSCOPY -SNIFF TEST
TECHNIQUE: Real-time fluoroscopic evaluation of the chest was performed.
FLUOROSCOPY TIME:  0 MINUTES, 6 SECONDS

[Series 1: cp_chest · 0.53mm/px · 4 of 65 frames shown (1 of 2)]
[frame 10/65]
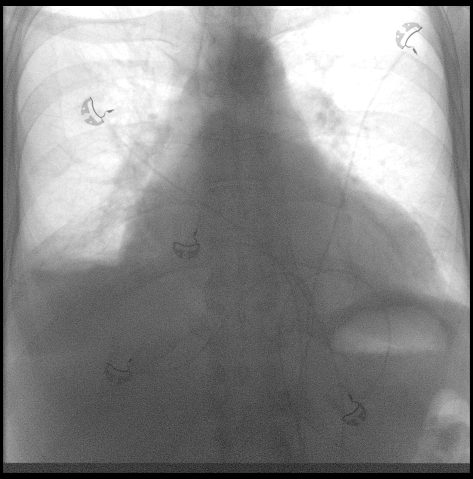
[frame 33/65]
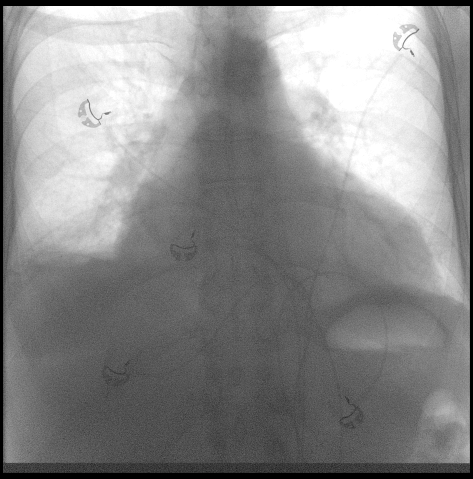
[frame 48/65]
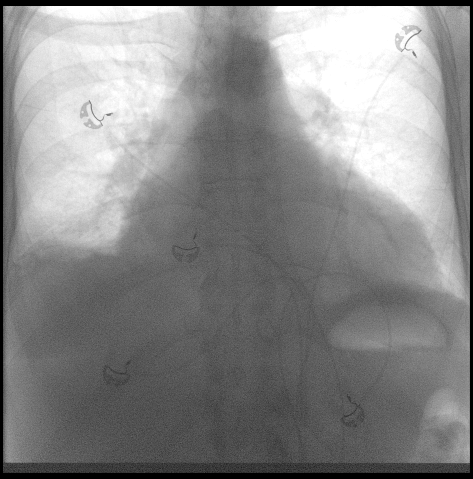
[frame 56/65]
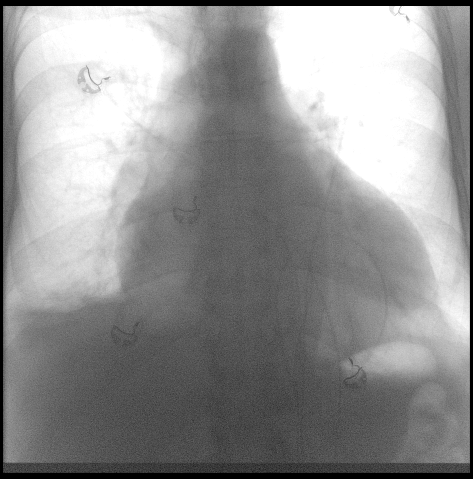

[Series 2: cp_chest · 0.26mm/px · 1 of 1 slices shown (2 of 2)]
[im 1/1]
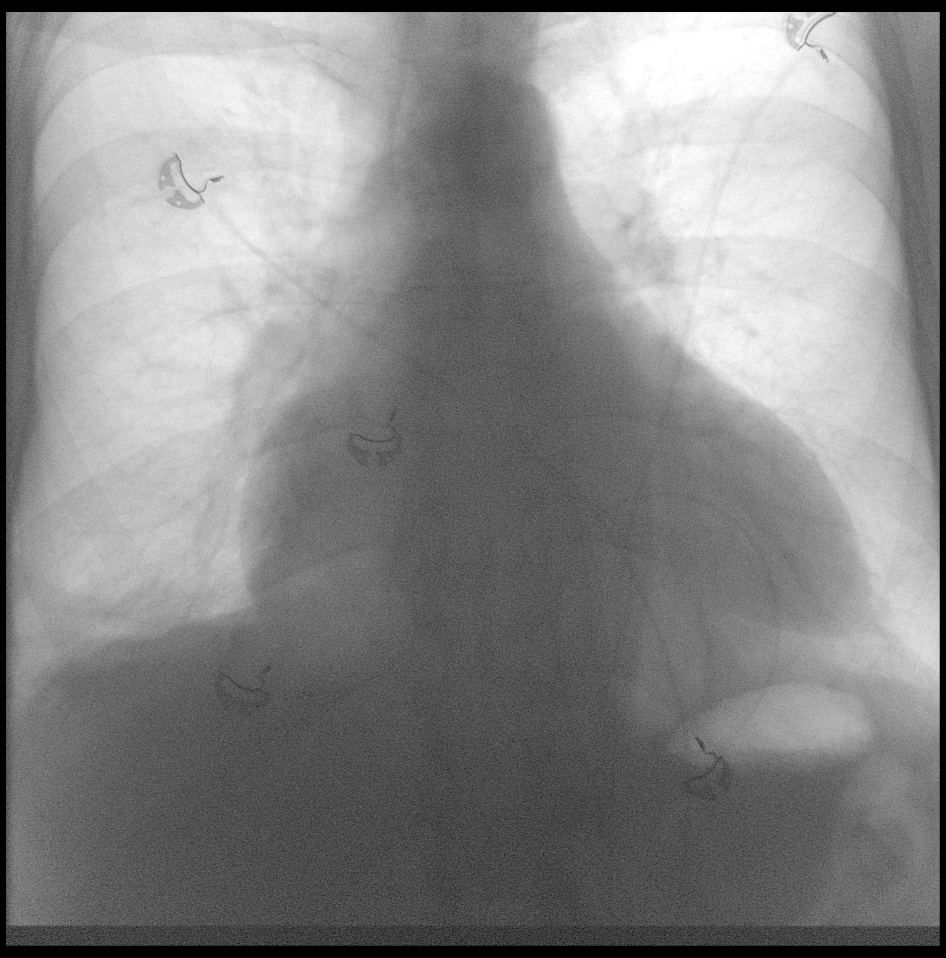

[5 of 5 positions shown; findings below may reference images not displayed]

FINDINGS: Small right pleural effusion with blunting of the right costophrenic
angle. Mild cardiomegaly.

With sniff maneuver, both diaphragms move down in the expected
fashion.
IMPRESSION: 1. Normal diaphragmatic motion with sniff maneuver; no findings of
paradoxical movement to suggest diaphragmatic denervation.
2. Small right pleural effusion.
3. Mild cardiomegaly.

## 2016-09-01 ENCOUNTER — Ambulatory Visit (HOSPITAL_COMMUNITY)
Admission: RE | Admit: 2016-09-01 | Discharge: 2016-09-01 | Disposition: A | Discharge status: Home / Self Care | Payer: BLUE CROSS/BLUE SHIELD | Source: Ambulatory Visit | Attending: Internal Medicine | Admitting: Internal Medicine

## 2016-09-01 ENCOUNTER — Encounter (HOSPITAL_COMMUNITY): Payer: Self-pay | Admitting: Internal Medicine

## 2016-09-01 VITALS — BP 118/72 | HR 58 | Wt 148.8 lb

## 2016-09-01 DIAGNOSIS — I428 Other cardiomyopathies: Secondary | ICD-10-CM | POA: Insufficient documentation

## 2016-09-01 DIAGNOSIS — Z86718 Personal history of other venous thrombosis and embolism: Secondary | ICD-10-CM | POA: Insufficient documentation

## 2016-09-01 DIAGNOSIS — Z888 Allergy status to other drugs, medicaments and biological substances status: Secondary | ICD-10-CM | POA: Insufficient documentation

## 2016-09-01 DIAGNOSIS — F419 Anxiety disorder, unspecified: Secondary | ICD-10-CM | POA: Insufficient documentation

## 2016-09-01 DIAGNOSIS — F329 Major depressive disorder, single episode, unspecified: Secondary | ICD-10-CM | POA: Diagnosis not present

## 2016-09-01 DIAGNOSIS — I480 Paroxysmal atrial fibrillation: Secondary | ICD-10-CM | POA: Diagnosis not present

## 2016-09-01 DIAGNOSIS — Z79899 Other long term (current) drug therapy: Secondary | ICD-10-CM | POA: Insufficient documentation

## 2016-09-01 DIAGNOSIS — Z86711 Personal history of pulmonary embolism: Secondary | ICD-10-CM | POA: Insufficient documentation

## 2016-09-01 DIAGNOSIS — J45909 Unspecified asthma, uncomplicated: Secondary | ICD-10-CM | POA: Insufficient documentation

## 2016-09-01 DIAGNOSIS — G2581 Restless legs syndrome: Secondary | ICD-10-CM | POA: Diagnosis not present

## 2016-09-01 DIAGNOSIS — Z7951 Long term (current) use of inhaled steroids: Secondary | ICD-10-CM | POA: Diagnosis not present

## 2016-09-01 DIAGNOSIS — I5022 Chronic systolic (congestive) heart failure: Secondary | ICD-10-CM | POA: Diagnosis not present

## 2016-09-01 DIAGNOSIS — I2510 Atherosclerotic heart disease of native coronary artery without angina pectoris: Secondary | ICD-10-CM | POA: Diagnosis not present

## 2016-09-01 LAB — BASIC METABOLIC PANEL
Anion gap: 5 (ref 5–15)
BUN: 13 mg/dL (ref 6–20)
CHLORIDE: 105 mmol/L (ref 101–111)
CO2: 28 mmol/L (ref 22–32)
Calcium: 9.2 mg/dL (ref 8.9–10.3)
Creatinine, Ser: 0.89 mg/dL (ref 0.61–1.24)
GFR calc Af Amer: >60 mL/min (ref >60)
GFR calc non Af Amer: >60 mL/min (ref >60)
Glucose, Bld: 97 mg/dL (ref 65–99)
POTASSIUM: 4.2 mmol/L (ref 3.5–5.1)
SODIUM: 138 mmol/L (ref 135–145)

## 2016-09-01 LAB — DIGOXIN LEVEL: DIGOXIN LVL: 1 ng/mL (ref 0.8–2.0)

## 2016-09-01 MED ORDER — IVABRADINE HCL 5 MG PO TABS: ORAL_TABLET | ORAL | 3 refills | Status: DC

## 2016-09-01 MED ORDER — SACUBITRIL-VALSARTAN 49-51 MG PO TABS: 1.0000 | ORAL_TABLET | Freq: Two times a day (BID) | ORAL | 3 refills | Status: DC

## 2016-09-01 NOTE — Addendum Note (Signed)
Encounter addended by: Modesta Messing, CMA on: 09/01/2016 12:36 PM<BR>    Actions taken: Order list changed

## 2016-09-01 NOTE — Patient Instructions (Signed)
Increase Entresto to 49/51 twice daily.  Routine lab work today. Will notify you of abnormal results  Follow up and Echo with Dr.Bensimhon in 6 months

## 2016-09-01 NOTE — Progress Notes (Signed)
Patient ID: Marcus Bates, male   DOB: December 31, 1955, 60 y.o.   MRN: [id number]    Advanced Heart Failure Clinic Note   PCP: [facility] Primary Cardiologist: Evia Goldsmith  HPI: Raylee is a 60 year old male with no significant PMHx who was admitted in August 2016 for acute systolic HF complicated by cardiogenic shock and RLL PE.  Echo 05/14/15 showed EF 10-15% with grade 3 diastolic dysfunction and LV mural thrombus. Severe RV dysfunction.  Cardiac cath 05/02/15 showed minimal nonobstructive CAD. RHC cath on 8/31 c/w low output. Was treated with milrinone which was eventually weaned off. HF med titration limited by low BP.  He developed a drug rash in the hospital. Initially thought to be due to spironolactone but persisted and then felt to be related to warfarin, transitioned to Eliquis. Discharge weight 141 pounds.  He returns today for HF follow up. Overall feeling good. Can walk a couple miles without any problem. Working every day.  Denies SOB/PND/Orthopnea/CP.  Weight at home 145-147 pounds. Taking all medications. No problems with dizziness. Has stopped Eliquis.   RHC 05/07/2015 RA = 9 RV = 39/5/12 PA = 41/20 (28) PCW = 18 Fick cardiac output/index = 2.6/1.4 Therms cardiac output/index = 3.6/2.0 PVR = 3.8 WU FA sat = 98% PA sat = 54%, 57  Cardiac MRI 05/05/15 1. Severely dilated left and right ventricle with severe biventricular systolic impairment. LVEF 13%  Diffuse mid wall late gadolinium enhancement in the myocardium of the left and right ventricle and in the RV attachment points to the LV consistent with dilated cardiomyopathy with biventricular involvement and decompensated heart failure. There is no evidence of inflammatory or infiltrative cardiomyopathy. 2. Severely dilated left atrium and moderately dilated right atrium. 3. Severe mitral and moderate tricuspid regurgitation. 4. Dilated main pulmonary artery consistent with pulmonary hypertension. There is severe swirling of the  blood in the right ventricle suspicious of the pre-thrombotic state.  ECHO 03/08/2016: No evidence of LV thrombus. EF 30-35%. RV ok.   ROS: All systems negative except as listed in HPI, PMH and Problem List.  SH:  Social History   Social History  . Marital status: Single    Spouse name: N/A  . Number of children: N/A  . Years of education: N/A   Occupational History  .      Tools   Social History Main Topics  . Smoking status: Never Smoker  . Smokeless tobacco: Never Used  . Alcohol use No  . Drug use: No  . Sexual activity: Not on file   Other Topics Concern  . Not on file   Social History Narrative  . No narrative on file    FH:  Family History  Problem Relation Age of Onset  . Heart disease Other   . Pulmonary embolism Neg Hx   . CAD Maternal Grandfather     MI in his 74s maybe?  . Stroke Paternal Grandfather   . CAD Maternal Aunt     bypass at 72    Past Medical History:  Diagnosis Date  . Acute deep vein thrombosis (DVT) of popliteal vein of right lower extremity (HCC)    a. 04/2015 -> Xarelto  . Anxiety   . Asthma   . Chronic combined systolic and diastolic CHF (congestive heart failure) (HCC)    a. 04/2015 Echo: EF 10-15%, sev diff HK, Gr 3 DD, sev MR, sev dil LA, dilated RV, mod dil RA.  . Depression   . Insomnia   . LV (left  ventricular) mural thrombus    a. 04/2015 Echo: possible laminated echodense area on inferior wall, cannot exclude laminated thrombus.  Marland Kitchen NICM (nonischemic cardiomyopathy) (HCC)    a. 04/2015 Echo: EF 10-15%, sev diff HK.  . Non-obstructive CAD    a. 04/2015 Cath: LM nl, LAD 4m, LCX 20ost, RCA 20d.  Marland Kitchen NSVT (nonsustained ventricular tachycardia) (HCC)   . Pulmonary embolism on right (HCC)    a. 04/2015 -> Xarelto  . Restless leg syndrome   . Severe mitral insufficiency    a. 04/2015 Echo: Sev MR.    Current Outpatient Prescriptions  Medication Sig Dispense Refill  . acetaminophen (TYLENOL) 500 MG tablet Take 500 mg by mouth  every 6 (six) hours as needed (calm nerves).    Marland Kitchen albuterol (PROVENTIL HFA;VENTOLIN HFA) 108 (90 BASE) MCG/ACT inhaler Inhale 2 puffs into the lungs every 4 (four) hours as needed for wheezing or shortness of breath. 1 Inhaler 2  . amphetamine-dextroamphetamine (ADDERALL) 10 MG tablet Take 2.5 mg by mouth daily with breakfast.    . carvedilol (COREG) 3.125 MG tablet Take 1 tablet (3.125 mg total) by mouth 2 (two) times daily with a meal. 60 tablet 6  . digoxin (LANOXIN) 0.25 MG tablet TAKE 1 TABLET (0.25 MG TOTAL) BY MOUTH DAILY. 90 tablet 3  . Fluticasone-Salmeterol (ADVAIR) 250-50 MCG/DOSE AEPB Inhale 1 puff into the lungs 2 (two) times daily as needed (SOB, wheezing). Reported on 11/10/2015    . gabapentin (NEURONTIN) 600 MG tablet Take 300 mg by mouth daily as needed (neuropathy).     . Guarana 1000 MG TABS Take 1,000 mg by mouth daily.    . iron polysaccharides (NIFEREX) 150 MG capsule Take 150 mg by mouth daily.    . ivabradine (CORLANOR) 5 MG TABS tablet TAKE 1 TABLET TWICE A DAY WITH A MEAL 180 tablet 3  . magnesium oxide (MAG-OX) 400 MG tablet Take 100 mg by mouth daily.    . Multiple Vitamins-Minerals (MULTIVITAMIN WITH MINERALS) tablet Take 1 tablet by mouth daily.    . Omega-3 Fatty Acids (FISH OIL) 1000 MG CAPS Take 1,000 mg by mouth daily. Reported on 11/10/2015    . PASSION FLOWER-VALERIAN PO Take 1 tablet by mouth daily.    . sacubitril-valsartan (ENTRESTO) 24-26 MG Take 1 tablet by mouth 2 (two) times daily. 60 tablet 6  . vitamin A 41423 UNIT capsule Take 25,000 Units by mouth 4 (four) times a week.     . vitamin E 100 UNIT capsule Take 100 Units by mouth 3 (three) times a week.     No current facility-administered medications for this encounter.     Vitals:   09/01/16 1208  BP: 118/72  Pulse: (!) 58  SpO2: 100%  Weight: 148 lb 12 oz (67.5 kg)    Wt Readings from Last 3 Encounters:  09/01/16 148 lb 12 oz (67.5 kg)  03/08/16 149 lb 8 oz (67.8 kg)  01/19/16 151 lb (68.5 kg)     PHYSICAL EXAM: General:  Well appearing. No resp difficulty HEENT: normal Neck: supple. JVP 5-6 . Carotids 2+ bilaterally; no bruits. No thyromegaly or nodule noted Cor: PMI laterally displaced.  RRR, no M/G/R Lungs: CTAB, normal effort Abdomen: soft, non-tender, non-distended, no HSM. No bruits or masses. +BS  Extremities: no cyanosis, clubbing, rash, no edema. Varicose veins bilateral LEs   ASSESSMENT & PLAN: 1. Chronic systolic HF with severe biventricular dysfunction EF 10-15% due to NICM. Newly diagnosed 8/16. Echo 09/10/15 with EF 25%. Echo  7/17 EF 25-30%. He does not want ICD. - NYHA I. Volume status stable. Can take lasix 20 mg prn, will likely not need with Entresto.  - Continue carvedilol 3.125 mg BID will not increase heart rate < 60.  - Increase Entresto 49/51 - Continue digoxin 0.25 mg daily. Last dig level 5/17 0.7.  - Continue corlanor 5 mg BID 2. RLL pulmonary embolism - In setting of low EF. Treated ~ 1year. Now off Eliquis.. 3. PAF - Regular rhythm. Off eliquis due to bleeding  4. Warfarin and ASA allergies  5. Mild nonobstructive CAD by cath 04/2015 6. Severe MR - resolved now trivial on last echo 7. LV mural thrombus- LV thrombus resolved on recent ech. He has been treated over 3 months. EF now improving. Off eliquis.   Labs today. Increase Entresto. F/u 6 months with echo.   Arvilla MeresBensimhon, Bryella Diviney, MD 12:24 PM

## 2016-09-01 NOTE — Addendum Note (Signed)
Encounter addended by: Modesta Messing, CMA on: 09/01/2016 12:34 PM<BR>    Actions taken: Order list changed, Diagnosis association updated, Sign clinical note

## 2016-09-26 ENCOUNTER — Other Ambulatory Visit (HOSPITAL_COMMUNITY): Payer: Self-pay | Admitting: Internal Medicine

## 2016-10-18 DIAGNOSIS — H18413 Arcus senilis, bilateral: Secondary | ICD-10-CM | POA: Diagnosis not present

## 2017-01-03 ENCOUNTER — Other Ambulatory Visit (HOSPITAL_COMMUNITY): Payer: Self-pay | Admitting: Internal Medicine

## 2017-02-03 ENCOUNTER — Other Ambulatory Visit (HOSPITAL_COMMUNITY): Payer: Self-pay | Admitting: Cardiology

## 2017-02-03 MED ORDER — CARVEDILOL 3.125 MG PO TABS: 3.1250 mg | ORAL_TABLET | Freq: Two times a day (BID) | ORAL | 3 refills | Status: DC

## 2017-02-28 DIAGNOSIS — J45909 Unspecified asthma, uncomplicated: Secondary | ICD-10-CM | POA: Diagnosis not present

## 2017-04-30 ENCOUNTER — Other Ambulatory Visit (HOSPITAL_COMMUNITY): Payer: Self-pay | Admitting: Internal Medicine

## 2017-06-01 ENCOUNTER — Other Ambulatory Visit (HOSPITAL_COMMUNITY): Payer: Self-pay | Admitting: Internal Medicine

## 2017-06-02 ENCOUNTER — Encounter (HOSPITAL_COMMUNITY): Payer: Self-pay | Admitting: Internal Medicine

## 2017-06-02 ENCOUNTER — Ambulatory Visit (HOSPITAL_COMMUNITY)
Admission: RE | Admit: 2017-06-02 | Discharge: 2017-06-02 | Disposition: A | Discharge status: Home / Self Care | Payer: BLUE CROSS/BLUE SHIELD | Source: Ambulatory Visit | Attending: Internal Medicine | Admitting: Internal Medicine

## 2017-06-02 ENCOUNTER — Ambulatory Visit (HOSPITAL_BASED_OUTPATIENT_CLINIC_OR_DEPARTMENT_OTHER)
Admission: RE | Admit: 2017-06-02 | Discharge: 2017-06-02 | Disposition: A | Discharge status: Home / Self Care | Payer: BLUE CROSS/BLUE SHIELD | Source: Ambulatory Visit | Attending: Internal Medicine | Admitting: Internal Medicine

## 2017-06-02 VITALS — BP 128/84 | HR 57 | Wt 151.4 lb

## 2017-06-02 DIAGNOSIS — I472 Ventricular tachycardia: Secondary | ICD-10-CM | POA: Diagnosis not present

## 2017-06-02 DIAGNOSIS — I340 Nonrheumatic mitral (valve) insufficiency: Secondary | ICD-10-CM | POA: Diagnosis not present

## 2017-06-02 DIAGNOSIS — I5022 Chronic systolic (congestive) heart failure: Secondary | ICD-10-CM

## 2017-06-02 DIAGNOSIS — I480 Paroxysmal atrial fibrillation: Secondary | ICD-10-CM | POA: Insufficient documentation

## 2017-06-02 DIAGNOSIS — Z86711 Personal history of pulmonary embolism: Secondary | ICD-10-CM | POA: Diagnosis not present

## 2017-06-02 DIAGNOSIS — I2510 Atherosclerotic heart disease of native coronary artery without angina pectoris: Secondary | ICD-10-CM | POA: Diagnosis not present

## 2017-06-02 DIAGNOSIS — J45909 Unspecified asthma, uncomplicated: Secondary | ICD-10-CM | POA: Diagnosis not present

## 2017-06-02 DIAGNOSIS — F329 Major depressive disorder, single episode, unspecified: Secondary | ICD-10-CM | POA: Insufficient documentation

## 2017-06-02 DIAGNOSIS — I428 Other cardiomyopathies: Secondary | ICD-10-CM

## 2017-06-02 DIAGNOSIS — F419 Anxiety disorder, unspecified: Secondary | ICD-10-CM | POA: Insufficient documentation

## 2017-06-02 DIAGNOSIS — R001 Bradycardia, unspecified: Secondary | ICD-10-CM | POA: Insufficient documentation

## 2017-06-02 DIAGNOSIS — I2699 Other pulmonary embolism without acute cor pulmonale: Secondary | ICD-10-CM | POA: Insufficient documentation

## 2017-06-02 DIAGNOSIS — Z86718 Personal history of other venous thrombosis and embolism: Secondary | ICD-10-CM | POA: Diagnosis not present

## 2017-06-02 DIAGNOSIS — Z79899 Other long term (current) drug therapy: Secondary | ICD-10-CM | POA: Insufficient documentation

## 2017-06-02 DIAGNOSIS — Z8249 Family history of ischemic heart disease and other diseases of the circulatory system: Secondary | ICD-10-CM | POA: Insufficient documentation

## 2017-06-02 DIAGNOSIS — G2581 Restless legs syndrome: Secondary | ICD-10-CM | POA: Insufficient documentation

## 2017-06-02 DIAGNOSIS — I429 Cardiomyopathy, unspecified: Secondary | ICD-10-CM | POA: Insufficient documentation

## 2017-06-02 DIAGNOSIS — G4700 Insomnia, unspecified: Secondary | ICD-10-CM | POA: Insufficient documentation

## 2017-06-02 DIAGNOSIS — N289 Disorder of kidney and ureter, unspecified: Secondary | ICD-10-CM | POA: Diagnosis not present

## 2017-06-02 LAB — ECHOCARDIOGRAM COMPLETE
AVLVOTPG: 3 mmHg
CHL CUP DOP CALC LVOT VTI: 20.8 cm
CHL CUP MV DEC (S): 303
CHL CUP STROKE VOLUME: 93 mL
E decel time: 303 msec
EERAT: 9.08
FS: 9 % — AB (ref 28–44)
IVS/LV PW RATIO, ED: 1.05
LA ID, A-P, ES: 53 mm
LA diam index: 2.93 cm/m2
LAVOLA4C: 41.4 mL
LEFT ATRIUM END SYS DIAM: 53 mm
LV E/e' medial: 9.08
LV SIMPSON'S DISK: 49
LV TDI E'MEDIAL: 5.55
LV dias vol: 189 mL — AB (ref 62–150)
LV sys vol index: 53 mL/m2
LV sys vol: 97 mL — AB (ref 21–61)
LVDIAVOLIN: 104 mL/m2
LVEEAVG: 9.08
LVELAT: 4.65 cm/s
LVOT SV: 86 mL
LVOT area: 4.15 cm2
LVOT diameter: 23 mm
LVOTPV: 87.9 cm/s
Lateral S' vel: 10.4 cm/s
MV pk A vel: 59.7 m/s
MVPKEVEL: 42.2 m/s
PW: 11.5 mm — AB (ref 0.6–1.1)
TAPSE: 25.4 mm
TDI e' lateral: 4.65

## 2017-06-02 LAB — BASIC METABOLIC PANEL
Anion gap: 7 (ref 5–15)
BUN: 12 mg/dL (ref 6–20)
CHLORIDE: 106 mmol/L (ref 101–111)
CO2: 25 mmol/L (ref 22–32)
CREATININE: 0.87 mg/dL (ref 0.61–1.24)
Calcium: 9.4 mg/dL (ref 8.9–10.3)
GFR calc Af Amer: >60 mL/min (ref >60)
GLUCOSE: 101 mg/dL — AB (ref 65–99)
Potassium: 4.4 mmol/L (ref 3.5–5.1)
SODIUM: 138 mmol/L (ref 135–145)

## 2017-06-02 MED ORDER — IVABRADINE HCL 5 MG PO TABS: ORAL_TABLET | ORAL | 3 refills | Status: DC

## 2017-06-02 MED ORDER — SACUBITRIL-VALSARTAN 97-103 MG PO TABS: 1.0000 | ORAL_TABLET | Freq: Two times a day (BID) | ORAL | 3 refills | Status: DC

## 2017-06-02 NOTE — Addendum Note (Signed)
Encounter addended by: Teresa Coombs, RN on: 06/02/2017  4:33 PM<BR>    Actions taken: Pharmacy for encounter modified

## 2017-06-02 NOTE — Progress Notes (Signed)
Patient ID: Marcus Bates, male   DOB: 02-Mar-1956, 61 y.o.   MRN: [id number]    Advanced Heart Failure Clinic Note   PCP: [facility] Primary Cardiologist: Odetta Forness  HPI: Marcus Bates is a 61 year old male with no significant PMHx who was admitted in August 2016 for acute systolic HF complicated by cardiogenic shock and RLL PE.  Echo 05/14/15 showed EF 10-15% with grade 3 diastolic dysfunction and LV mural thrombus. Severe RV dysfunction.  Cardiac cath 05/02/15 showed minimal nonobstructive CAD. RHC cath on 8/31 c/w low output. Was treated with milrinone which was eventually weaned off. HF med titration limited by low BP.  He developed a drug rash in the hospital. Initially thought to be due to spironolactone but persisted and then felt to be related to warfarin, transitioned to Eliquis. Discharge weight 141 pounds.  He returns today for regular follow up. Feeling great overall.   Still walking a few miles without difficulty. 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 Weight at home ~148/150 lbs. Denies SOB/PND/Orthopnea/CP. Denies lightheadedness or dizziness. He did become slightly woozy after trying to "run" a parking lot, but none since.   Echo today shows 40-45% by visual review by Dr. Gala Romney   RHC 05/07/2015 RA = 9 RV = 39/5/12 PA = 41/20 (28) PCW = 18 Fick cardiac output/index = 2.6/1.4 Therms cardiac output/index = 3.6/2.0 PVR = 3.8 WU FA sat = 98% PA sat = 54%, 57  Cardiac MRI 05/05/15 1. Severely dilated left and right ventricle with severe biventricular systolic impairment. LVEF 13%  Diffuse mid wall late gadolinium enhancement in the myocardium of the left and right ventricle and in the RV attachment points to the LV consistent with dilated cardiomyopathy with biventricular involvement and decompensated heart failure. There is no evidence of inflammatory or infiltrative cardiomyopathy. 2. Severely dilated left atrium and moderately dilated right atrium. 3. Severe  mitral and moderate tricuspid regurgitation. 4. Dilated main pulmonary artery consistent with pulmonary hypertension. There is severe swirling of the blood in the right ventricle suspicious of the pre-thrombotic state.  ECHO 03/08/2016: No evidence of LV thrombus. EF 30-35%. RV ok.  Review of systems complete and found to be negative unless listed in HPI.    SH:  Social History   Social History  . Marital status: Single    Spouse name: N/A  . Number of children: N/A  . Years of education: N/A   Occupational History  .      Tools   Social History Main Topics  . Smoking status: Never Smoker  . Smokeless tobacco: Never Used  . Alcohol use No  . Drug use: No  . Sexual activity: Not on file   Other Topics Concern  . Not on file   Social History Narrative  . No narrative on file    FH:  Family History  Problem Relation Age of Onset  . Heart disease Other   . Pulmonary embolism Neg Hx   . CAD Maternal Grandfather        MI in his 34s maybe?  . Stroke Paternal Grandfather   . CAD Maternal Aunt        bypass at 16    Past Medical History:  Diagnosis Date  . Acute deep vein thrombosis (DVT) of popliteal vein of right lower extremity (HCC)    a. 04/2015 -> Xarelto  . Anxiety   . Asthma   . Chronic combined systolic and diastolic CHF (congestive heart failure) (HCC)  a. 04/2015 Echo: EF 10-15%, sev diff HK, Gr 3 DD, sev MR, sev dil LA, dilated RV, mod dil RA.  . Depression   . Insomnia   . LV (left ventricular) mural thrombus    a. 04/2015 Echo: possible laminated echodense area on inferior wall, cannot exclude laminated thrombus.  Marland Kitchen NICM (nonischemic cardiomyopathy) (HCC)    a. 04/2015 Echo: EF 10-15%, sev diff HK.  . Non-obstructive CAD    a. 04/2015 Cath: LM nl, LAD 70m, LCX 20ost, RCA 20d.  Marland Kitchen NSVT (nonsustained ventricular tachycardia) (HCC)   . Pulmonary embolism on right (HCC)    a. 04/2015 -> Xarelto  . Restless leg syndrome   . Severe mitral insufficiency     a. 04/2015 Echo: Sev MR.    Current Outpatient Prescriptions  Medication Sig Dispense Refill  . acetaminophen (TYLENOL) 500 MG tablet Take 500 mg by mouth every 6 (six) hours as needed (calm nerves).    Marland Kitchen albuterol (PROVENTIL HFA;VENTOLIN HFA) 108 (90 BASE) MCG/ACT inhaler Inhale 2 puffs into the lungs every 4 (four) hours as needed for wheezing or shortness of breath. 1 Inhaler 2  . amphetamine-dextroamphetamine (ADDERALL) 10 MG tablet Take 2.5 mg by mouth daily with breakfast.    . carvedilol (COREG) 3.125 MG tablet TAKE 1 TABLET (3.125 MG TOTAL) BY MOUTH 2 (TWO) TIMES DAILY WITH A MEAL. 60 tablet 0  . digoxin (LANOXIN) 0.25 MG tablet TAKE 1 TABLET (0.25 MG TOTAL) BY MOUTH DAILY. 90 tablet 3  . ENTRESTO 49-51 MG TAKE 1 TABLET BY MOUTH TWICE A DAY 60 tablet 3  . Fluticasone-Salmeterol (ADVAIR) 250-50 MCG/DOSE AEPB Inhale 1 puff into the lungs 2 (two) times daily as needed (SOB, wheezing). Reported on 11/10/2015    . gabapentin (NEURONTIN) 600 MG tablet Take 300 mg by mouth daily as needed (neuropathy).     . Guarana 1000 MG TABS Take 1,000 mg by mouth daily.    . iron polysaccharides (NIFEREX) 150 MG capsule Take 150 mg by mouth daily.    . ivabradine (CORLANOR) 5 MG TABS tablet TAKE 1 TABLET TWICE A DAY WITH A MEAL 180 tablet 3  . magnesium oxide (MAG-OX) 400 MG tablet Take 100 mg by mouth daily.    . Multiple Vitamins-Minerals (MULTIVITAMIN WITH MINERALS) tablet Take 1 tablet by mouth daily.    . Omega-3 Fatty Acids (FISH OIL) 1000 MG CAPS Take 1,000 mg by mouth daily. Reported on 11/10/2015    . PASSION FLOWER-VALERIAN PO Take 1 tablet by mouth daily.    . vitamin A 16109 UNIT capsule Take 25,000 Units by mouth 4 (four) times a week.     . vitamin E 100 UNIT capsule Take 100 Units by mouth 3 (three) times a week.     No current facility-administered medications for this encounter.     Vitals:   06/02/17 1509  BP: 128/84  Pulse: (!) 57  SpO2: 97%  Weight: 151 lb 6.4 oz (68.7 kg)    Wt  Readings from Last 3 Encounters:  06/02/17 151 lb 6.4 oz (68.7 kg)  09/01/16 148 lb 12 oz (67.5 kg)  03/08/16 149 lb 8 oz (67.8 kg)    PHYSICAL EXAM: General: Well appearing. No resp difficulty. HEENT: Normal Neck: Supple. JVP 5-6. Carotids 2+ bilat; no bruits. No thyromegaly or nodule noted. Cor: PMI nondisplaced. RRR, No M/G/R noted Lungs: CTAB, normal effort. Abdomen: Soft, non-tender, non-distended, no HSM. No bruits or masses. +BS  Extremities: No cyanosis, clubbing, or rash. No edema.  Varicose veins bilateral LEs.  Neuro: Alert & orientedx3, cranial nerves grossly intact. moves all 4 extremities w/o difficulty. Affect pleasant   ASSESSMENT & PLAN: 1. Chronic systolic HF with severe biventricular dysfunction EF 10-15% due to NICM. Newly diagnosed 8/16. Echo 09/10/15 with EF 25%. Echo 7/17 EF 25-30%. He does not want ICD. - NYHA I symptoms - Volume status stable on exam - Continue lasix 20 mg as needed.  - Continue carvedilol 3.125 mg BID. Titration limited by bradycardia.  - Increase Entresto 97/103 mg BID. BMET today and 10-14 days.  - Stop digoxin.  - Continue corlanor 5 mg BID - Failed spiro in past 2. RLL pulmonary embolism - In setting of low EF. Treated ~ 1year. Now off Eliquis. No change.   3. PAF - Regular rhythm. Off eliquis due to bleeding  4. Warfarin and ASA allergies  5. Mild nonobstructive CAD by cath 04/2015 - No s/s of ischemia.    6. Severe MR -  - Echo today shows structurally normal MV with only trivial regurgitation. 7. LV mural thrombus- LV thrombus resolved on echo. Eliquis stopped previously.  Labs today, and repeat 10-14 days with increase Entresto. Check EKG with repeat labs. May be able to stop corlanor.  Graciella Freer, PA-C  3:05 PM   Patient seen and examined with the above-signed Advanced Practice Provider and/or Housestaff. I personally reviewed laboratory data, imaging studies and relevant notes. I independently examined the patient  and formulated the important aspects of the plan. I have edited the note to reflect any of my changes or salient points. I have personally discussed the plan with the patient and/or family.  He is doing very well. NYHA I. Volume status stable. Echo reviewed personally EF 40-45% (much improved). Will increase Entresto to 97/103 bid. Stop digoxin. Will see back in several weeks and start to wean ivabradine.   [name], MD  3:43 PM

## 2017-06-02 NOTE — Patient Instructions (Signed)
Stop Digoxin   Increase Entresto 97/103 (1 tab), twice a day  Labs drawn today (if we do not call you, then your lab work was stable)   Your physician recommends that you return for lab work in: 2 weeks with an EKG  Your physician recommends that you schedule a follow-up appointment in: 3-4 months

## 2017-06-20 ENCOUNTER — Inpatient Hospital Stay (HOSPITAL_COMMUNITY): Admission: RE | Admit: 2017-06-20 | Payer: BLUE CROSS/BLUE SHIELD | Source: Ambulatory Visit

## 2017-06-20 ENCOUNTER — Ambulatory Visit (HOSPITAL_COMMUNITY)
Admission: RE | Admit: 2017-06-20 | Discharge: 2017-06-20 | Disposition: A | Discharge status: Home / Self Care | Payer: BLUE CROSS/BLUE SHIELD | Source: Ambulatory Visit | Attending: Cardiology | Admitting: Cardiology

## 2017-06-20 ENCOUNTER — Encounter (HOSPITAL_COMMUNITY): Payer: Self-pay

## 2017-06-20 DIAGNOSIS — R9431 Abnormal electrocardiogram [ECG] [EKG]: Secondary | ICD-10-CM | POA: Diagnosis not present

## 2017-06-20 DIAGNOSIS — I5022 Chronic systolic (congestive) heart failure: Secondary | ICD-10-CM | POA: Diagnosis not present

## 2017-06-20 LAB — BASIC METABOLIC PANEL
ANION GAP: 9 (ref 5–15)
BUN: 16 mg/dL (ref 6–20)
CO2: 26 mmol/L (ref 22–32)
Calcium: 9.3 mg/dL (ref 8.9–10.3)
Chloride: 104 mmol/L (ref 101–111)
Creatinine, Ser: 1 mg/dL (ref 0.61–1.24)
GFR calc Af Amer: >60 mL/min (ref >60)
GLUCOSE: 77 mg/dL (ref 65–99)
POTASSIUM: 3.6 mmol/L (ref 3.5–5.1)
SODIUM: 139 mmol/L (ref 135–145)

## 2017-06-20 NOTE — Patient Instructions (Signed)
Labs today (will call for abnormal results, otherwise no news is good news)  Please take Entresto 97-103 mg Twice Daily

## 2017-06-21 DIAGNOSIS — G4721 Circadian rhythm sleep disorder, delayed sleep phase type: Secondary | ICD-10-CM | POA: Diagnosis not present

## 2017-06-21 DIAGNOSIS — G2581 Restless legs syndrome: Secondary | ICD-10-CM | POA: Diagnosis not present

## 2017-06-21 DIAGNOSIS — G4712 Idiopathic hypersomnia without long sleep time: Secondary | ICD-10-CM | POA: Diagnosis not present

## 2017-06-23 ENCOUNTER — Encounter (HOSPITAL_COMMUNITY): Payer: Self-pay | Admitting: *Deleted

## 2017-06-23 ENCOUNTER — Telehealth (HOSPITAL_COMMUNITY): Payer: Self-pay | Admitting: *Deleted

## 2017-06-23 MED ORDER — IVABRADINE HCL 5 MG PO TABS: 2.5000 mg | ORAL_TABLET | Freq: Two times a day (BID) | ORAL | 3 refills | Status: DC

## 2017-06-23 NOTE — Telephone Encounter (Signed)
Letter mailed to patient to decrease his Corlanor to 2.5 mg Twice Daily per Dr. Gala Romney.  Medication updated and letter mailed to patient today since his home number has been disconnected.

## 2017-07-05 ENCOUNTER — Other Ambulatory Visit (HOSPITAL_COMMUNITY): Payer: Self-pay | Admitting: Internal Medicine

## 2017-07-07 MED ORDER — IVABRADINE HCL 5 MG PO TABS: 2.5000 mg | ORAL_TABLET | Freq: Two times a day (BID) | ORAL | 3 refills | Status: DC

## 2017-07-07 NOTE — Telephone Encounter (Signed)
Patient called back and left message on triage line stating he received his letter and will decrease 2.5 mg of corlanor twice a day.  Also left his work number that he can be reached at after 3pm- [phone] Ext [phone]

## 2017-07-07 NOTE — Addendum Note (Signed)
Addended by: Georgina Peer on: 07/07/2017 08:58 AM   Modules accepted: Orders

## 2017-07-11 ENCOUNTER — Other Ambulatory Visit (HOSPITAL_COMMUNITY): Payer: Self-pay | Admitting: Internal Medicine

## 2017-07-13 ENCOUNTER — Other Ambulatory Visit (HOSPITAL_COMMUNITY): Payer: Self-pay | Admitting: *Deleted

## 2017-07-13 MED ORDER — CARVEDILOL 3.125 MG PO TABS: 3.1250 mg | ORAL_TABLET | Freq: Two times a day (BID) | ORAL | 6 refills | Status: DC

## 2017-07-13 MED ORDER — IVABRADINE HCL 5 MG PO TABS: 2.5000 mg | ORAL_TABLET | Freq: Two times a day (BID) | ORAL | 5 refills | Status: DC

## 2017-08-31 ENCOUNTER — Ambulatory Visit (HOSPITAL_COMMUNITY)
Admission: RE | Admit: 2017-08-31 | Discharge: 2017-08-31 | Disposition: A | Discharge status: Home / Self Care | Payer: BLUE CROSS/BLUE SHIELD | Source: Ambulatory Visit | Attending: Internal Medicine | Admitting: Internal Medicine

## 2017-08-31 ENCOUNTER — Encounter (HOSPITAL_COMMUNITY): Payer: Self-pay | Admitting: Internal Medicine

## 2017-08-31 VITALS — BP 150/88 | HR 69 | Wt 156.0 lb

## 2017-08-31 DIAGNOSIS — N289 Disorder of kidney and ureter, unspecified: Secondary | ICD-10-CM | POA: Diagnosis not present

## 2017-08-31 DIAGNOSIS — G2581 Restless legs syndrome: Secondary | ICD-10-CM | POA: Insufficient documentation

## 2017-08-31 DIAGNOSIS — I5022 Chronic systolic (congestive) heart failure: Secondary | ICD-10-CM | POA: Insufficient documentation

## 2017-08-31 DIAGNOSIS — I429 Cardiomyopathy, unspecified: Secondary | ICD-10-CM | POA: Insufficient documentation

## 2017-08-31 DIAGNOSIS — L270 Generalized skin eruption due to drugs and medicaments taken internally: Secondary | ICD-10-CM | POA: Diagnosis not present

## 2017-08-31 DIAGNOSIS — I480 Paroxysmal atrial fibrillation: Secondary | ICD-10-CM | POA: Diagnosis not present

## 2017-08-31 DIAGNOSIS — G4700 Insomnia, unspecified: Secondary | ICD-10-CM | POA: Insufficient documentation

## 2017-08-31 DIAGNOSIS — F419 Anxiety disorder, unspecified: Secondary | ICD-10-CM | POA: Insufficient documentation

## 2017-08-31 DIAGNOSIS — Z8249 Family history of ischemic heart disease and other diseases of the circulatory system: Secondary | ICD-10-CM | POA: Insufficient documentation

## 2017-08-31 DIAGNOSIS — I340 Nonrheumatic mitral (valve) insufficiency: Secondary | ICD-10-CM

## 2017-08-31 DIAGNOSIS — Z86718 Personal history of other venous thrombosis and embolism: Secondary | ICD-10-CM | POA: Diagnosis not present

## 2017-08-31 DIAGNOSIS — I2510 Atherosclerotic heart disease of native coronary artery without angina pectoris: Secondary | ICD-10-CM | POA: Diagnosis not present

## 2017-08-31 DIAGNOSIS — Z86711 Personal history of pulmonary embolism: Secondary | ICD-10-CM | POA: Insufficient documentation

## 2017-08-31 DIAGNOSIS — J45909 Unspecified asthma, uncomplicated: Secondary | ICD-10-CM | POA: Insufficient documentation

## 2017-08-31 DIAGNOSIS — I428 Other cardiomyopathies: Secondary | ICD-10-CM | POA: Diagnosis not present

## 2017-08-31 DIAGNOSIS — Z79899 Other long term (current) drug therapy: Secondary | ICD-10-CM | POA: Diagnosis not present

## 2017-08-31 DIAGNOSIS — F329 Major depressive disorder, single episode, unspecified: Secondary | ICD-10-CM | POA: Insufficient documentation

## 2017-08-31 MED ORDER — SACUBITRIL-VALSARTAN 49-51 MG PO TABS: 1.0000 | ORAL_TABLET | Freq: Two times a day (BID) | ORAL | 6 refills | Status: DC

## 2017-08-31 NOTE — Patient Instructions (Addendum)
START Entresto 49/51 mg tablet twice daily.  Routine lab work today. Will notify you of abnormal results, otherwise no news is good news!  Return in 10 days for repeat labs.  Follow up 4 months with Dr. Gala Romney. We will call you closer to this time, or you may call our office to schedule 1 month before you are due to be seen. Take all medication as prescribed the day of your appointment. Bring all medications with you to your appointment.  Do the following things EVERYDAY: 1) Weigh yourself in the morning before breakfast. Write it down and keep it in a log. 2) Take your medicines as prescribed 3) Eat low salt foods-Limit salt (sodium) to 2000 mg per day.  4) Stay as active as you can everyday 5) Limit all fluids for the day to less than 2 liters

## 2017-08-31 NOTE — Progress Notes (Signed)
Patient ID: Marcus Bates, male   DOB: 24-Feb-1956, 61 y.o.   MRN: [id number]    Advanced Heart Failure Clinic Note   PCP: Fulton Mole Primary Cardiologist: Bensimhon  HPI: Marcus Bates is a 58 -year-old male with no significant PMHx who was admitted in August 2016 for acute systolic HF complicated by cardiogenic shock and RLL PE.  Echo 05/14/15 showed EF 10-15% with grade 3 diastolic dysfunction and LV mural thrombus. Severe RV dysfunction.  Cardiac cath 05/02/15 showed minimal nonobstructive CAD. RHC cath on 8/31 c/w low output. Was treated with milrinone which was eventually weaned off. HF med titration limited by low BP.  He developed a drug rash in the hospital. Initially thought to be due to spironolactone but persisted and then felt to be related to warfarin, transitioned to Eliquis. Discharge weight 141 pounds.  He presents today for regular follow up. He has been off of Entresto for about a month. States it affected his sleep so has been off. His sleep has not gotten any better off of Entresto, but he has not restarted. Denies any SOB/PND/Orthopnea/CP. He has occasional lightheadedness. He states once a month it may be so severe he feels like he might pass out, but has no clear trigger, and does not happen often. Main issue is poor sleep. Had normal PSG in 2015.   Echo 05/2017 with EF 45-50%, with Grade 1 DD.   RHC 05/07/2015 RA = 9 RV = 39/5/12 PA = 41/20 (28) PCW = 18 Fick cardiac output/index = 2.6/1.4 Therms cardiac output/index = 3.6/2.0 PVR = 3.8 WU FA sat = 98% PA sat = 54%, 57  Cardiac MRI 05/05/15 1. Severely dilated left and right ventricle with severe biventricular systolic impairment. LVEF 13%  Diffuse mid wall late gadolinium enhancement in the myocardium of the left and right ventricle and in the RV attachment points to the LV consistent with dilated cardiomyopathy with biventricular involvement and decompensated heart failure. There is no evidence of inflammatory or infiltrative  cardiomyopathy. 2. Severely dilated left atrium and moderately dilated right atrium. 3. Severe mitral and moderate tricuspid regurgitation. 4. Dilated main pulmonary artery consistent with pulmonary hypertension. There is severe swirling of the blood in the right ventricle suspicious of the pre-thrombotic state.  ECHO 03/08/2016: No evidence of LV thrombus. EF 30-35%. RV ok.  Review of systems complete and found to be negative unless listed in HPI.    SH:  Social History   Socioeconomic History  . Marital status: Single    Spouse name: Not on file  . Number of children: Not on file  . Years of education: Not on file  . Highest education level: Not on file  Social Needs  . Financial resource strain: Not on file  . Food insecurity - worry: Not on file  . Food insecurity - inability: Not on file  . Transportation needs - medical: Not on file  . Transportation needs - non-medical: Not on file  Occupational History    Comment: Tools  Tobacco Use  . Smoking status: Never Smoker  . Smokeless tobacco: Never Used  Substance and Sexual Activity  . Alcohol use: No    Alcohol/week: 0.0 oz  . Drug use: No  . Sexual activity: Not on file  Other Topics Concern  . Not on file  Social History Narrative  . Not on file    FH:  Family History  Problem Relation Age of Onset  . Heart disease Other   . Pulmonary embolism Neg Hx   .  CAD Maternal Grandfather        MI in his 6850s maybe?  . Stroke Paternal Grandfather   . CAD Maternal Aunt        bypass at 2965    Past Medical History:  Diagnosis Date  . Acute deep vein thrombosis (DVT) of popliteal vein of right lower extremity (HCC)    a. 04/2015 -> Xarelto  . Anxiety   . Asthma   . Chronic combined systolic and diastolic CHF (congestive heart failure) (HCC)    a. 04/2015 Echo: EF 10-15%, sev diff HK, Gr 3 DD, sev MR, sev dil LA, dilated RV, mod dil RA.  . Depression   . Insomnia   . LV (left ventricular) mural thrombus    a.  04/2015 Echo: possible laminated echodense area on inferior wall, cannot exclude laminated thrombus.  Marland Kitchen. NICM (nonischemic cardiomyopathy) (HCC)    a. 04/2015 Echo: EF 10-15%, sev diff HK.  . Non-obstructive CAD    a. 04/2015 Cath: LM nl, LAD 5250m, LCX 20ost, RCA 20d.  Marland Kitchen. NSVT (nonsustained ventricular tachycardia) (HCC)   . Pulmonary embolism on right (HCC)    a. 04/2015 -> Xarelto  . Restless leg syndrome   . Severe mitral insufficiency    a. 04/2015 Echo: Sev MR.    Current Outpatient Medications  Medication Sig Dispense Refill  . acetaminophen (TYLENOL) 500 MG tablet Take 500 mg by mouth every 6 (six) hours as needed (calm nerves).    Marland Kitchen. albuterol (PROVENTIL HFA;VENTOLIN HFA) 108 (90 BASE) MCG/ACT inhaler Inhale 2 puffs into the lungs every 4 (four) hours as needed for wheezing or shortness of breath. 1 Inhaler 2  . amphetamine-dextroamphetamine (ADDERALL) 10 MG tablet Take 2.5 mg by mouth daily with breakfast.    . carvedilol (COREG) 3.125 MG tablet Take 1 tablet (3.125 mg total) 2 (two) times daily with a meal by mouth. 60 tablet 6  . Fluticasone-Salmeterol (ADVAIR) 250-50 MCG/DOSE AEPB Inhale 1 puff into the lungs 2 (two) times daily as needed (SOB, wheezing). Reported on 11/10/2015    . gabapentin (NEURONTIN) 600 MG tablet Take 300 mg by mouth daily as needed (neuropathy).     . Guarana 1000 MG TABS Take 1,000 mg by mouth daily.    . iron polysaccharides (NIFEREX) 150 MG capsule Take 150 mg by mouth daily.    . ivabradine (CORLANOR) 5 MG TABS tablet Take 0.5 tablets (2.5 mg total) 2 (two) times daily with a meal by mouth. 30 tablet 5  . magnesium oxide (MAG-OX) 400 MG tablet Take 100 mg by mouth daily.    . Multiple Vitamins-Minerals (MULTIVITAMIN WITH MINERALS) tablet Take 1 tablet by mouth daily.    . Omega-3 Fatty Acids (FISH OIL) 1000 MG CAPS Take 1,000 mg by mouth daily. Reported on 11/10/2015    . PASSION FLOWER-VALERIAN PO Take 1 tablet by mouth daily.    . vitamin A 1610925000 UNIT capsule  Take 25,000 Units by mouth 4 (four) times a week.     . vitamin E 100 UNIT capsule Take 100 Units by mouth 3 (three) times a week.    . sacubitril-valsartan (ENTRESTO) 49-51 MG Take 1 tablet by mouth 2 (two) times daily. 60 tablet 6   No current facility-administered medications for this encounter.     Vitals:   08/31/17 1420  BP: (!) 150/88  Pulse: 69  SpO2: 98%  Weight: 156 lb (70.8 kg)    Wt Readings from Last 3 Encounters:  08/31/17 156 lb (70.8  kg)  06/20/17 149 lb 3.2 oz (67.7 kg)  06/02/17 151 lb 6.4 oz (68.7 kg)    PHYSICAL EXAM: General: Well appearing. No resp difficulty. HEENT: Normal. Anicteric  Neck: Supple. JVP 5-6. Carotids 2+ bilat; no bruits. No thyromegaly or nodule noted. Cor: PMI nondisplaced. RRR, No M/G/R noted Lungs: CTAB, normal effort. Abdomen: Soft, non-tender, non-distended, no HSM. No bruits or masses. +BS  Extremities: No cyanosis, clubbing, or rash. R and LLE no edema. Varicose veins present bilaterally.  Neuro: Alert & orientedx3, cranial nerves grossly intact. moves all 4 extremities w/o difficulty. Affect pleasant   ASSESSMENT & PLAN: 1. Chronic systolic HF with severe biventricular dysfunction EF 10-15% due to NICM. Newly diagnosed 8/16. Echo 09/10/15 with EF 25%. Echo 7/17 EF 25-30%. Refused ICD. - NYHA I symptoms - Echo 05/2017 with EF 45-50%, with Grade 1 DD.  - Volume status stable on exam.  - Continue lasix 20 mg as needed.  - Continue carvedilol 3.125 mg BID. Titration has been limited by bradycardia.  - Pt willing to resume Entresto at 49/51 mg BID. BMET today and 10 days.  - Continue corlanor 2.5 mg BID - Failed spiro in past 2. RLL pulmonary embolism  - In setting of low EF. Treated ~ 1year. Now off Eliquis. No change. 3. PAF - Regular rhythm. Off eliquis due to bleeding   4. Warfarin and ASA allergies  5. Mild nonobstructive CAD by cath 04/2015 - No s/s of ischemia.    6. MR - - Echo 05/2017 showed structurally normal MV with  only trivial regurgitation. 7. LV mural thrombus - LV thrombus resolved on echo 05/2017. Eliquis stopped previously. No change. 8. Sleep Disturbances - Can consider repeat sleep study at some point.  Last in 04/2014 - Per Sleep Doctor.   Doing well overall. Resume Entresto as above. Labs today and 10 days. RTC 4 months.   [name], MD  5:36 PM   Patient seen and examined with the above-signed Advanced Practice Provider and/or Housestaff. I personally reviewed laboratory data, imaging studies and relevant notes. I independently examined the patient and formulated the important aspects of the plan. I have edited the note to reflect any of my changes or salient points. I have personally discussed the plan with the patient and/or family.  Overall stable. Says he did not tolerate higher dose of Entresto due to insomnia but he has stopped Entresto and insomnia persists so doubt this is the issue and he agrees with that. Will restart Entresto 49/51 bid. Volume status looks good. NYHA I-II. Stressed need to repeat sleep evaluation.   [name], MD  5:36 PM

## 2017-09-12 ENCOUNTER — Other Ambulatory Visit (HOSPITAL_COMMUNITY): Payer: BLUE CROSS/BLUE SHIELD

## 2017-10-10 ENCOUNTER — Telehealth (HOSPITAL_COMMUNITY): Payer: Self-pay | Admitting: Pharmacist

## 2017-10-10 NOTE — Telephone Encounter (Signed)
Corlanor PA approved by [employer] commercial through 09/05/38.   Tyler Deis. Bonnye Fava, PharmD, BCPS, CPP Clinical Pharmacist Phone: [phone] 10/10/2017 11:24 AM

## 2018-01-31 DIAGNOSIS — G4721 Circadian rhythm sleep disorder, delayed sleep phase type: Secondary | ICD-10-CM | POA: Diagnosis not present

## 2018-01-31 DIAGNOSIS — G4712 Idiopathic hypersomnia without long sleep time: Secondary | ICD-10-CM | POA: Diagnosis not present

## 2018-04-10 ENCOUNTER — Ambulatory Visit (HOSPITAL_COMMUNITY)
Admission: RE | Admit: 2018-04-10 | Discharge: 2018-04-10 | Disposition: A | Discharge status: Home / Self Care | Payer: BLUE CROSS/BLUE SHIELD | Source: Ambulatory Visit | Attending: Internal Medicine | Admitting: Internal Medicine

## 2018-04-10 ENCOUNTER — Encounter (HOSPITAL_COMMUNITY): Payer: Self-pay | Admitting: Internal Medicine

## 2018-04-10 ENCOUNTER — Other Ambulatory Visit: Payer: Self-pay

## 2018-04-10 VITALS — BP 130/82 | HR 77 | Wt 156.1 lb

## 2018-04-10 DIAGNOSIS — Z86711 Personal history of pulmonary embolism: Secondary | ICD-10-CM | POA: Insufficient documentation

## 2018-04-10 DIAGNOSIS — F329 Major depressive disorder, single episode, unspecified: Secondary | ICD-10-CM | POA: Diagnosis not present

## 2018-04-10 DIAGNOSIS — G4700 Insomnia, unspecified: Secondary | ICD-10-CM | POA: Insufficient documentation

## 2018-04-10 DIAGNOSIS — Z79899 Other long term (current) drug therapy: Secondary | ICD-10-CM | POA: Diagnosis not present

## 2018-04-10 DIAGNOSIS — F419 Anxiety disorder, unspecified: Secondary | ICD-10-CM | POA: Insufficient documentation

## 2018-04-10 DIAGNOSIS — I493 Ventricular premature depolarization: Secondary | ICD-10-CM | POA: Insufficient documentation

## 2018-04-10 DIAGNOSIS — Z8249 Family history of ischemic heart disease and other diseases of the circulatory system: Secondary | ICD-10-CM | POA: Insufficient documentation

## 2018-04-10 DIAGNOSIS — I5022 Chronic systolic (congestive) heart failure: Secondary | ICD-10-CM | POA: Diagnosis not present

## 2018-04-10 DIAGNOSIS — G2581 Restless legs syndrome: Secondary | ICD-10-CM | POA: Insufficient documentation

## 2018-04-10 DIAGNOSIS — I2699 Other pulmonary embolism without acute cor pulmonale: Secondary | ICD-10-CM | POA: Diagnosis not present

## 2018-04-10 DIAGNOSIS — I2510 Atherosclerotic heart disease of native coronary artery without angina pectoris: Secondary | ICD-10-CM | POA: Diagnosis not present

## 2018-04-10 DIAGNOSIS — I513 Intracardiac thrombosis, not elsewhere classified: Secondary | ICD-10-CM | POA: Diagnosis not present

## 2018-04-10 DIAGNOSIS — J45909 Unspecified asthma, uncomplicated: Secondary | ICD-10-CM | POA: Insufficient documentation

## 2018-04-10 DIAGNOSIS — I428 Other cardiomyopathies: Secondary | ICD-10-CM | POA: Insufficient documentation

## 2018-04-10 DIAGNOSIS — I480 Paroxysmal atrial fibrillation: Secondary | ICD-10-CM | POA: Insufficient documentation

## 2018-04-10 DIAGNOSIS — I5042 Chronic combined systolic (congestive) and diastolic (congestive) heart failure: Secondary | ICD-10-CM | POA: Insufficient documentation

## 2018-04-10 LAB — BASIC METABOLIC PANEL
ANION GAP: 7 (ref 5–15)
BUN: 18 mg/dL (ref 8–23)
CHLORIDE: 106 mmol/L (ref 98–111)
CO2: 27 mmol/L (ref 22–32)
Calcium: 9.2 mg/dL (ref 8.9–10.3)
Creatinine, Ser: 0.92 mg/dL (ref 0.61–1.24)
GFR calc Af Amer: >60 mL/min (ref >60)
GLUCOSE: 96 mg/dL (ref 70–99)
POTASSIUM: 4.1 mmol/L (ref 3.5–5.1)
Sodium: 140 mmol/L (ref 135–145)

## 2018-04-10 MED ORDER — CARVEDILOL 3.125 MG PO TABS: 3.1250 mg | ORAL_TABLET | Freq: Two times a day (BID) | ORAL | 3 refills | Status: DC

## 2018-04-10 NOTE — Patient Instructions (Signed)
Start Carvedilol 3.125 mg Twice daily   Lab today  Your physician has requested that you have an echocardiogram. Echocardiography is a painless test that uses sound waves to create images of your heart. It provides your doctor with information about the size and shape of your heart and how well your heart's chambers and valves are working. This procedure takes approximately one hour. There are no restrictions for this procedure.  We will contact you in 1 year to schedule your next appointment.

## 2018-04-10 NOTE — Progress Notes (Signed)
Patient ID: Marcus Bates, male   DOB: [birth year], 62 y.o.   MRN: [id number]    Advanced Heart Failure Clinic Note   PCP: [facility] Mole Primary Cardiologist: Zonia Caplin  HPI: Marcus Bates is a 33 -year-old male with no significant PMHx who was admitted in August 2016 for acute systolic HF complicated by cardiogenic shock and RLL PE.  Echo 05/14/15 showed EF 10-15% with grade 3 diastolic dysfunction and LV mural thrombus. Severe RV dysfunction.  Cardiac cath 05/02/15 showed minimal nonobstructive CAD. RHC cath on 8/31 c/w low output. Was treated with milrinone which was eventually weaned off. HF med titration limited by low BP.  He developed a drug rash in the hospital. Initially thought to be due to spironolactone but persisted and then felt to be related to warfarin, transitioned to Eliquis. Discharge weight 141 pounds.  He presents today for regular follow up. Last visit Sherryll Burger was restarted. Overall doing well. Denies SOB[place] No problems with stairs or hills. Denies orthopnea, PND, or edema.Weight stable.  Denies CP or palpitations. He has rare lightheadedness. Occurs with activity, but not with sitting to standing. No syncope or presyncope. No recent episodes of feeling like he was going to pass out with memory impairment. Taking all medications, but coreg was not refilled and has been off for several months. Has not needed PRN lasix. Weights up about 5 lbs on home scale, but weight is unchanged on our scale. Limits fluid and salt intake.   Echo 05/2017 with EF 45-50%, with Grade 1 DD.   RHC 05/07/2015 RA = 9 RV = 39/5/12 PA = 41/20 (28) PCW = 18 Fick cardiac output/index = 2.6/1.4 Therms cardiac output/index = 3.6/2.0 PVR = 3.8 WU FA sat = 98% PA sat = 54%, 57  Cardiac MRI 05/05/15 1. Severely dilated left and right ventricle with severe biventricular systolic impairment. LVEF 13%  Diffuse mid wall late gadolinium enhancement in the myocardium of the left and right  ventricle and in the RV attachment points to the LV consistent with dilated cardiomyopathy with biventricular involvement and decompensated heart failure. There is no evidence of inflammatory or infiltrative cardiomyopathy. 2. Severely dilated left atrium and moderately dilated right atrium. 3. Severe mitral and moderate tricuspid regurgitation. 4. Dilated main pulmonary artery consistent with pulmonary hypertension. There is severe swirling of the blood in the right ventricle suspicious of the pre-thrombotic state.  ECHO 03/08/2016: No evidence of LV thrombus. EF 30-35%. RV ok.  Review of systems complete and found to be negative unless listed in HPI.   SH:  Social History   Socioeconomic History  . Marital status: Single    Spouse name: Not on file  . Number of children: Not on file  . Years of education: Not on file  . Highest education level: Not on file  Occupational History    Comment: Tools  Social Needs  . Financial resource strain: Not on file  . Food insecurity:    Worry: Not on file    Inability: Not on file  . Transportation needs:    Medical: Not on file    Non-medical: Not on file  Tobacco Use  . Smoking status: Never Smoker  . Smokeless tobacco: Never Used  Substance and Sexual Activity  . Alcohol use: No    Alcohol/week: 0.0 oz  . Drug use: No  . Sexual activity: Not on file  Lifestyle  . Physical activity:    Days per week: Not on file  Minutes per session: Not on file  . Stress: Not on file  Relationships  . Social connections:    Talks on phone: Not on file    Gets together: Not on file    Attends religious service: Not on file    Active member of club or organization: Not on file    Attends meetings of clubs or organizations: Not on file    Relationship status: Not on file  . Intimate partner violence:    Fear of current or ex partner: Not on file    Emotionally abused: Not on file    Physically abused: Not on file    Forced sexual  activity: Not on file  Other Topics Concern  . Not on file  Social History Narrative  . Not on file    FH:  Family History  Problem Relation Age of Onset  . Heart disease Other   . Pulmonary embolism Neg Hx   . CAD Maternal Grandfather        MI in his 78s maybe?  . Stroke Paternal Grandfather   . CAD Maternal Aunt        bypass at 65    Past Medical History:  Diagnosis Date  . Acute deep vein thrombosis (DVT) of popliteal vein of right lower extremity (HCC)    a. 04/2015 -> Xarelto  . Anxiety   . Asthma   . Chronic combined systolic and diastolic CHF (congestive heart failure) (HCC)    a. 04/2015 Echo: EF 10-15%, sev diff HK, Gr 3 DD, sev MR, sev dil LA, dilated RV, mod dil RA.  . Depression   . Insomnia   . LV (left ventricular) mural thrombus    a. 04/2015 Echo: possible laminated echodense area on inferior wall, cannot exclude laminated thrombus.  Marland Kitchen NICM (nonischemic cardiomyopathy) (HCC)    a. 04/2015 Echo: EF 10-15%, sev diff HK.  . Non-obstructive CAD    a. 04/2015 Cath: LM nl, LAD 31m, LCX 20ost, RCA 20d.  Marland Kitchen NSVT (nonsustained ventricular tachycardia) (HCC)   . Pulmonary embolism on right (HCC)    a. 04/2015 -> Xarelto  . Restless leg syndrome   . Severe mitral insufficiency    a. 04/2015 Echo: Sev MR.    Current Outpatient Medications  Medication Sig Dispense Refill  . acetaminophen (TYLENOL) 500 MG tablet Take 500 mg by mouth every 6 (six) hours as needed (calm nerves).    Marland Kitchen albuterol (PROVENTIL HFA;VENTOLIN HFA) 108 (90 BASE) MCG/ACT inhaler Inhale 2 puffs into the lungs every 4 (four) hours as needed for wheezing or shortness of breath. 1 Inhaler 2  . amphetamine-dextroamphetamine (ADDERALL) 10 MG tablet Take 2.5 mg by mouth daily with breakfast.    . Fluticasone-Salmeterol (ADVAIR) 250-50 MCG/DOSE AEPB Inhale 1 puff into the lungs 2 (two) times daily as needed (SOB, wheezing). Reported on 11/10/2015    . gabapentin (NEURONTIN) 600 MG tablet Take 300 mg by mouth  daily as needed (neuropathy).     . Guarana 1000 MG TABS Take 1,000 mg by mouth daily.    . iron polysaccharides (NIFEREX) 150 MG capsule Take 150 mg by mouth daily.    . ivabradine (CORLANOR) 5 MG TABS tablet Take 0.5 tablets (2.5 mg total) 2 (two) times daily with a meal by mouth. 30 tablet 5  . magnesium oxide (MAG-OX) 400 MG tablet Take 100 mg by mouth daily.    . Multiple Vitamins-Minerals (MULTIVITAMIN WITH MINERALS) tablet Take 1 tablet by mouth daily.    Marland Kitchen  Omega-3 Fatty Acids (FISH OIL) 1000 MG CAPS Take 1,000 mg by mouth daily. Reported on 11/10/2015    . PASSION FLOWER-VALERIAN PO Take 1 tablet by mouth daily.    . sacubitril-valsartan (ENTRESTO) 49-51 MG Take 1 tablet by mouth 2 (two) times daily. 60 tablet 6  . vitamin A 16109 UNIT capsule Take 25,000 Units by mouth 4 (four) times a week.     . vitamin E 100 UNIT capsule Take 100 Units by mouth 3 (three) times a week.     No current facility-administered medications for this encounter.     Vitals:   04/10/18 1437  BP: 130/82  Pulse: 77  SpO2: 99%  Weight: 156 lb 2 oz (70.8 kg)    Wt Readings from Last 3 Encounters:  04/10/18 156 lb 2 oz (70.8 kg)  08/31/17 156 lb (70.8 kg)  06/20/17 149 lb 3.2 oz (67.7 kg)    PHYSICAL EXAM: General:  Well appearing. No resp difficulty HEENT: normal Neck: supple. no JVD. Carotids 2+ bilat; no bruits. No lymphadenopathy or thryomegaly appreciated. Cor: PMI nondisplaced. Regular rate & rhythm. No rubs, gallops or murmurs. Lungs: clear Abdomen: soft, nontender, nondistended. No hepatosplenomegaly. No bruits or masses. Good bowel sounds. Extremities: no cyanosis, clubbing, rash, edema Neuro: alert & orientedx3, cranial nerves grossly intact. moves all 4 extremities w/o difficulty. Affect pleasant    ASSESSMENT & PLAN: 1. Chronic systolic HF with severe biventricular dysfunction EF 10-15% due to NICM. Newly diagnosed 8/16. Echo 09/10/15 with EF 25%. Echo 7/17 EF 25-30%. Refused ICD. - NYHA  I symptoms - Echo 05/2017 with EF 45-50%, with Grade 1 DD.  - Volume status looks good  - Continue lasix 20 mg as needed. None recently  - Off coreg. Was unable to get a refill. Restart coreg 3.125 mg BID.  - Continue Entresto at 49/51 mg BID. Did not tolerate higher dose - caused insomnia - Continue corlanor 2.5 mg BID - Failed spiro in past. Had rash.  2. RLL pulmonary embolism  - In setting of low EF. Treated ~ 1year. Now off Eliquis. No change.  3. PAF - Regular rhythm. Off eliquis due to bleeding. No change. 4. Warfarin and ASA allergies  5. Mild nonobstructive CAD by cath 04/2015 - No s/s ischemia 6. MR - - Echo 05/2017 showed structurally normal MV with only trivial regurgitation. 7. LV mural thrombus - LV thrombus resolved on echo 05/2017. Eliquis stopped previously. No change.  8. Sleep Disturbances - Follows with Sleep Doctor every 6 months.  9. Occasional PVCs - On exam. If EF lower on echo, would want to quantify PVC burden - EKG today.    Alford Highland, NP  3:01 PM    Patient seen and examined with the above-signed Advanced Practice Provider and/or Housestaff. I personally reviewed laboratory data, imaging studies and relevant notes. I independently examined the patient and formulated the important aspects of the plan. I have edited the note to reflect any of my changes or salient points. I have personally discussed the plan with the patient and/or family.  Doing well. NYHA I. Volume status looks good. Will resume carvedilol. Unable to titrate Entresto due to SEs. Would repeat echo to make sure EF stable. Labs today. Occasional PVCs on exam. Check ECG. If Ef drops again consider 48-hour holter to evaluate PVC burden.   [name], MD  3:06 PM

## 2018-04-14 ENCOUNTER — Other Ambulatory Visit (HOSPITAL_COMMUNITY): Payer: Self-pay

## 2018-04-14 MED ORDER — CARVEDILOL 3.125 MG PO TABS: 3.1250 mg | ORAL_TABLET | Freq: Two times a day (BID) | ORAL | 3 refills | Status: DC

## 2018-04-24 ENCOUNTER — Encounter (HOSPITAL_COMMUNITY): Payer: Self-pay | Admitting: Internal Medicine

## 2018-04-24 ENCOUNTER — Ambulatory Visit (HOSPITAL_COMMUNITY)
Admission: RE | Admit: 2018-04-24 | Discharge: 2018-04-24 | Disposition: A | Discharge status: Home / Self Care | Payer: BLUE CROSS/BLUE SHIELD | Source: Ambulatory Visit | Attending: Internal Medicine | Admitting: Internal Medicine

## 2018-04-24 DIAGNOSIS — I429 Cardiomyopathy, unspecified: Secondary | ICD-10-CM | POA: Insufficient documentation

## 2018-04-24 DIAGNOSIS — I472 Ventricular tachycardia: Secondary | ICD-10-CM | POA: Insufficient documentation

## 2018-04-24 DIAGNOSIS — I5022 Chronic systolic (congestive) heart failure: Secondary | ICD-10-CM | POA: Diagnosis not present

## 2018-04-24 NOTE — Progress Notes (Signed)
  Echocardiogram 2D Echocardiogram has been performed.  Roosvelt Maser F 04/24/2018, 4:01 PM

## 2018-06-04 ENCOUNTER — Other Ambulatory Visit (HOSPITAL_COMMUNITY): Payer: Self-pay | Admitting: Internal Medicine

## 2018-06-15 ENCOUNTER — Telehealth (HOSPITAL_COMMUNITY): Payer: Self-pay

## 2018-06-15 NOTE — Telephone Encounter (Signed)
Pt called VM left for pt to call back so we can get him an appointment EF back down to 25-30%. Will need to discuss ICD. Will need f/u appt.

## 2018-07-06 ENCOUNTER — Encounter (HOSPITAL_COMMUNITY): Payer: Self-pay | Admitting: *Deleted

## 2018-07-14 ENCOUNTER — Telehealth (HOSPITAL_COMMUNITY): Payer: Self-pay | Admitting: Vascular Surgery

## 2018-07-14 ENCOUNTER — Telehealth (HOSPITAL_COMMUNITY): Payer: Self-pay | Admitting: *Deleted

## 2018-07-14 NOTE — Telephone Encounter (Signed)
Pt left VM on triage requesting an appt with Dr.Bensimhon stating he didn't feel well. I spoke with Herbert Seta to find a time that I could double book Dr.Bensimhon. Before I was able to call the patient back he walked in stating his heart felt jittery and wanted to be seen this afternoon. Per Herbert Seta we do not have any work ins this afternoon. If patient felt bad he should be evaluated in the Emergency Room or he can wait to be seen Monday at 2:30pm. Patient didn't want to be seen in the emergency room and decided to schedule appointment. I told patient again that if he feels bad he needs to call 911 or have someone take him to the emergency room. Patient verbalized understanding.

## 2018-07-14 NOTE — Telephone Encounter (Signed)
Returned pt call from answering service, left pt message to call triage if he is having problems and needs a sooner appt, pt is in recall for 04/2019

## 2018-07-17 ENCOUNTER — Inpatient Hospital Stay (HOSPITAL_COMMUNITY)
Admission: AD | Admit: 2018-07-17 | Discharge: 2018-07-26 | DRG: 308 | Disposition: A | Discharge status: Home / Self Care | Payer: BLUE CROSS/BLUE SHIELD | Source: Other Acute Inpatient Hospital | Attending: Internal Medicine | Admitting: Internal Medicine

## 2018-07-17 ENCOUNTER — Encounter (HOSPITAL_COMMUNITY): Payer: Self-pay

## 2018-07-17 ENCOUNTER — Other Ambulatory Visit: Payer: Self-pay

## 2018-07-17 ENCOUNTER — Ambulatory Visit (HOSPITAL_COMMUNITY)
Admission: RE | Admit: 2018-07-17 | Discharge: 2018-07-17 | Disposition: A | Discharge status: Home / Self Care | Payer: BLUE CROSS/BLUE SHIELD | Source: Ambulatory Visit | Attending: Cardiology | Admitting: Cardiology

## 2018-07-17 VITALS — BP 98/64 | HR 190 | Wt 162.4 lb

## 2018-07-17 DIAGNOSIS — Z79899 Other long term (current) drug therapy: Secondary | ICD-10-CM

## 2018-07-17 DIAGNOSIS — Z7951 Long term (current) use of inhaled steroids: Secondary | ICD-10-CM | POA: Diagnosis not present

## 2018-07-17 DIAGNOSIS — I2510 Atherosclerotic heart disease of native coronary artery without angina pectoris: Secondary | ICD-10-CM | POA: Diagnosis not present

## 2018-07-17 DIAGNOSIS — I5023 Acute on chronic systolic (congestive) heart failure: Secondary | ICD-10-CM | POA: Diagnosis not present

## 2018-07-17 DIAGNOSIS — R570 Cardiogenic shock: Secondary | ICD-10-CM | POA: Diagnosis present

## 2018-07-17 DIAGNOSIS — I472 Ventricular tachycardia, unspecified: Secondary | ICD-10-CM

## 2018-07-17 DIAGNOSIS — F419 Anxiety disorder, unspecified: Secondary | ICD-10-CM | POA: Diagnosis not present

## 2018-07-17 DIAGNOSIS — K5900 Constipation, unspecified: Secondary | ICD-10-CM | POA: Diagnosis present

## 2018-07-17 DIAGNOSIS — Z86718 Personal history of other venous thrombosis and embolism: Secondary | ICD-10-CM | POA: Diagnosis not present

## 2018-07-17 DIAGNOSIS — G2581 Restless legs syndrome: Secondary | ICD-10-CM | POA: Diagnosis present

## 2018-07-17 DIAGNOSIS — Z8249 Family history of ischemic heart disease and other diseases of the circulatory system: Secondary | ICD-10-CM | POA: Diagnosis not present

## 2018-07-17 DIAGNOSIS — I4891 Unspecified atrial fibrillation: Secondary | ICD-10-CM | POA: Diagnosis present

## 2018-07-17 DIAGNOSIS — K7200 Acute and subacute hepatic failure without coma: Secondary | ICD-10-CM | POA: Diagnosis not present

## 2018-07-17 DIAGNOSIS — I420 Dilated cardiomyopathy: Secondary | ICD-10-CM | POA: Diagnosis present

## 2018-07-17 DIAGNOSIS — I208 Other forms of angina pectoris: Secondary | ICD-10-CM

## 2018-07-17 DIAGNOSIS — G4700 Insomnia, unspecified: Secondary | ICD-10-CM | POA: Diagnosis present

## 2018-07-17 DIAGNOSIS — J45909 Unspecified asthma, uncomplicated: Secondary | ICD-10-CM | POA: Diagnosis present

## 2018-07-17 DIAGNOSIS — I471 Supraventricular tachycardia: Principal | ICD-10-CM | POA: Diagnosis present

## 2018-07-17 DIAGNOSIS — I480 Paroxysmal atrial fibrillation: Secondary | ICD-10-CM | POA: Diagnosis present

## 2018-07-17 DIAGNOSIS — I5022 Chronic systolic (congestive) heart failure: Secondary | ICD-10-CM | POA: Diagnosis present

## 2018-07-17 DIAGNOSIS — I340 Nonrheumatic mitral (valve) insufficiency: Secondary | ICD-10-CM | POA: Diagnosis not present

## 2018-07-17 DIAGNOSIS — Z452 Encounter for adjustment and management of vascular access device: Secondary | ICD-10-CM

## 2018-07-17 DIAGNOSIS — Z4682 Encounter for fitting and adjustment of non-vascular catheter: Secondary | ICD-10-CM | POA: Diagnosis not present

## 2018-07-17 DIAGNOSIS — Z86711 Personal history of pulmonary embolism: Secondary | ICD-10-CM

## 2018-07-17 DIAGNOSIS — I493 Ventricular premature depolarization: Secondary | ICD-10-CM | POA: Diagnosis present

## 2018-07-17 DIAGNOSIS — R0602 Shortness of breath: Secondary | ICD-10-CM | POA: Diagnosis not present

## 2018-07-17 DIAGNOSIS — D649 Anemia, unspecified: Secondary | ICD-10-CM | POA: Diagnosis present

## 2018-07-17 DIAGNOSIS — Z886 Allergy status to analgesic agent status: Secondary | ICD-10-CM

## 2018-07-17 LAB — TROPONIN I: Troponin I: <0.03 ng/mL (ref <0.03)

## 2018-07-17 LAB — COMPREHENSIVE METABOLIC PANEL
ALT: 151 U/L — ABNORMAL HIGH (ref 0–44)
ANION GAP: 9 (ref 5–15)
AST: 136 U/L — AB (ref 15–41)
Albumin: 3.5 g/dL (ref 3.5–5.0)
Alkaline Phosphatase: 49 U/L (ref 38–126)
BILIRUBIN TOTAL: 0.7 mg/dL (ref 0.3–1.2)
BUN: 17 mg/dL (ref 8–23)
CO2: 21 mmol/L — ABNORMAL LOW (ref 22–32)
Calcium: 8.4 mg/dL — ABNORMAL LOW (ref 8.9–10.3)
Chloride: 107 mmol/L (ref 98–111)
Creatinine, Ser: 1.3 mg/dL — ABNORMAL HIGH (ref 0.61–1.24)
GFR calc Af Amer: >60 mL/min (ref >60)
GFR calc non Af Amer: 57 mL/min — ABNORMAL LOW (ref >60)
GLUCOSE: 128 mg/dL — AB (ref 70–99)
POTASSIUM: 3.8 mmol/L (ref 3.5–5.1)
SODIUM: 137 mmol/L (ref 135–145)
TOTAL PROTEIN: 5.9 g/dL — AB (ref 6.5–8.1)

## 2018-07-17 LAB — CBC
HEMATOCRIT: 38.5 % — AB (ref 39.0–52.0)
HEMOGLOBIN: 12.2 g/dL — AB (ref 13.0–17.0)
MCH: 29.7 pg (ref 26.0–34.0)
MCHC: 31.7 g/dL (ref 30.0–36.0)
MCV: 93.7 fL (ref 80.0–100.0)
Platelets: 202 10*3/uL (ref 150–400)
RBC: 4.11 MIL/uL — AB (ref 4.22–5.81)
RDW: 13.8 % (ref 11.5–15.5)
WBC: 7.7 10*3/uL (ref 4.0–10.5)
nRBC: 0 % (ref 0.0–0.2)

## 2018-07-17 LAB — MAGNESIUM: Magnesium: 2 mg/dL (ref 1.7–2.4)

## 2018-07-17 LAB — BRAIN NATRIURETIC PEPTIDE: B Natriuretic Peptide: 2003.9 pg/mL — ABNORMAL HIGH (ref 0.0–100.0)

## 2018-07-17 LAB — TSH: TSH: 3.241 u[IU]/mL (ref 0.350–4.500)

## 2018-07-17 LAB — MRSA PCR SCREENING: MRSA by PCR: NEGATIVE

## 2018-07-17 MED ORDER — AMIODARONE IV BOLUS ONLY 150 MG/100ML
150.0000 mg | Freq: Once | INTRAVENOUS | Status: AC
  Administered 2018-07-17: 150 mg via INTRAVENOUS

## 2018-07-17 MED ORDER — MOMETASONE FURO-FORMOTEROL FUM 200-5 MCG/ACT IN AERO
2.0000 | INHALATION_SPRAY | Freq: Two times a day (BID) | RESPIRATORY_TRACT | Status: DC
  Administered 2018-07-17: 2 via RESPIRATORY_TRACT
  Filled 2018-07-17: qty 8.8

## 2018-07-17 MED ORDER — SODIUM CHLORIDE 0.9 % IV SOLN: 250.0000 mL | INTRAVENOUS | Status: DC | PRN

## 2018-07-17 MED ORDER — AMIODARONE HCL IN DEXTROSE 360-4.14 MG/200ML-% IV SOLN
30.0000 mg/h | INTRAVENOUS | Status: DC
  Filled 2018-07-17: qty 200

## 2018-07-17 MED ORDER — ONDANSETRON HCL 4 MG/2ML IJ SOLN
4.0000 mg | Freq: Four times a day (QID) | INTRAMUSCULAR | Status: DC | PRN
  Administered 2018-07-18 – 2018-07-20 (×6): 4 mg via INTRAVENOUS
  Filled 2018-07-17 (×6): qty 2

## 2018-07-17 MED ORDER — AMIODARONE LOAD VIA INFUSION
150.0000 mg | Freq: Once | INTRAVENOUS | Status: DC
  Administered 2018-07-17: 150 mg via INTRAVENOUS
  Filled 2018-07-17: qty 83.34

## 2018-07-17 MED ORDER — SODIUM CHLORIDE 0.9% FLUSH
3.0000 mL | Freq: Two times a day (BID) | INTRAVENOUS | Status: DC
  Administered 2018-07-17 – 2018-07-21 (×5): 3 mL via INTRAVENOUS

## 2018-07-17 MED ORDER — SODIUM CHLORIDE 0.9% FLUSH: 3.0000 mL | INTRAVENOUS | Status: DC | PRN

## 2018-07-17 MED ORDER — AMIODARONE HCL IN DEXTROSE 360-4.14 MG/200ML-% IV SOLN
60.0000 mg/h | INTRAVENOUS | Status: DC
  Filled 2018-07-17: qty 200

## 2018-07-17 MED ORDER — TRAZODONE HCL 50 MG PO TABS
50.0000 mg | ORAL_TABLET | Freq: Every evening | ORAL | Status: DC | PRN
  Administered 2018-07-23: 50 mg via ORAL
  Administered 2018-07-24: 25 mg via ORAL
  Filled 2018-07-17 (×2): qty 1

## 2018-07-17 MED ORDER — GABAPENTIN 600 MG PO TABS: 300.0000 mg | ORAL_TABLET | Freq: Every day | ORAL | Status: DC | PRN

## 2018-07-17 MED ORDER — AMIODARONE HCL IN DEXTROSE 360-4.14 MG/200ML-% IV SOLN
30.0000 mg/h | INTRAVENOUS | Status: DC
  Administered 2018-07-18 (×2): 30 mg/h via INTRAVENOUS
  Filled 2018-07-17 (×3): qty 200

## 2018-07-17 MED ORDER — FUROSEMIDE 10 MG/ML IJ SOLN
40.0000 mg | Freq: Once | INTRAMUSCULAR | Status: AC
  Administered 2018-07-17: 40 mg via INTRAVENOUS
  Filled 2018-07-17: qty 4

## 2018-07-17 MED ORDER — HEPARIN BOLUS VIA INFUSION
3700.0000 [IU] | Freq: Once | INTRAVENOUS | Status: AC
  Administered 2018-07-17: 3700 [IU] via INTRAVENOUS
  Filled 2018-07-17: qty 3700

## 2018-07-17 MED ORDER — ACETAMINOPHEN 325 MG PO TABS
650.0000 mg | ORAL_TABLET | ORAL | Status: DC | PRN
  Administered 2018-07-19 – 2018-07-26 (×7): 650 mg via ORAL
  Filled 2018-07-17 (×7): qty 2

## 2018-07-17 MED ORDER — POTASSIUM CHLORIDE 10 MEQ/100ML IV SOLN: 10.0000 meq | INTRAVENOUS | Status: DC

## 2018-07-17 MED ORDER — ADENOSINE 6 MG/2ML IV SOLN
6.0000 mg | Freq: Once | INTRAVENOUS | Status: AC
  Administered 2018-07-17: 6 mg via INTRAVENOUS
  Filled 2018-07-17 (×2): qty 2

## 2018-07-17 MED ORDER — AMIODARONE HCL IN DEXTROSE 360-4.14 MG/200ML-% IV SOLN
60.0000 mg/h | INTRAVENOUS | Status: DC
  Administered 2018-07-17 (×2): 60 mg/h via INTRAVENOUS
  Filled 2018-07-17: qty 200

## 2018-07-17 MED ORDER — POTASSIUM CHLORIDE 10 MEQ/100ML IV SOLN
10.0000 meq | INTRAVENOUS | Status: AC
  Administered 2018-07-17 (×4): 10 meq via INTRAVENOUS
  Filled 2018-07-17 (×4): qty 100

## 2018-07-17 MED ORDER — HEPARIN (PORCINE) 25000 UT/250ML-% IV SOLN
1800.0000 [IU]/h | INTRAVENOUS | Status: DC
  Administered 2018-07-17: 1050 [IU]/h via INTRAVENOUS
  Administered 2018-07-18: 1200 [IU]/h via INTRAVENOUS
  Administered 2018-07-19: 1450 [IU]/h via INTRAVENOUS
  Administered 2018-07-21 – 2018-07-22 (×2): 1800 [IU]/h via INTRAVENOUS
  Filled 2018-07-17 (×6): qty 250

## 2018-07-17 MED ORDER — ACETAMINOPHEN 500 MG PO TABS: 500.0000 mg | ORAL_TABLET | Freq: Four times a day (QID) | ORAL | Status: DC | PRN

## 2018-07-17 MED ORDER — ALBUTEROL SULFATE (2.5 MG/3ML) 0.083% IN NEBU
2.5000 mg | INHALATION_SOLUTION | RESPIRATORY_TRACT | Status: DC | PRN
  Filled 2018-07-17: qty 3

## 2018-07-17 MED ORDER — ADENOSINE 6 MG/2ML IV SOLN
18.0000 mg | Freq: Once | INTRAVENOUS | Status: DC
  Filled 2018-07-17: qty 6

## 2018-07-17 MED ORDER — ADENOSINE 12 MG/4ML IV SOLN
12.0000 mg | Freq: Once | INTRAVENOUS | Status: AC
  Administered 2018-07-17: 12 mg via INTRAVENOUS
  Filled 2018-07-17 (×2): qty 4

## 2018-07-17 NOTE — Addendum Note (Signed)
Encounter addended by: Noralee Space, RN on: 07/17/2018 4:00 PM  Actions taken: LDA properties accepted, MAR administration accepted

## 2018-07-17 NOTE — Addendum Note (Signed)
Encounter addended by: Elvin So, RPH-CPP on: 07/17/2018 4:02 PM  Actions taken: Visit diagnoses modified, Diagnosis association updated, Order list changed

## 2018-07-17 NOTE — Progress Notes (Addendum)
Paged by nurse re: pt request for resumption of Dulera due to continued SOB. Apparently feels this way when anxious but refuses neb and anxiety med. Pt admitted with SVT->rapid AF, on amio load. IV Lasix also given. Per nurse, lungs clear, O2 sat 99-100%. HR remains 150s. Have resumed Dulera but on sign out to fellow asked him to go by to lay eyes on patient while we are admitting a patient on a different floor. Nurse aware to notify of any acute changes. Jeston Junkins PA-C

## 2018-07-17 NOTE — H&P (Addendum)
ADVANCED HF TEAM H&P  This refllects work completed today and will serve as H&P.  PCP: Marcus Bates Primary Cardiologist: Marcus Bates  HPI: Marcus Bates is a 62 -year-old male with h/o systolic HF due to NICM and PAF.   Admitted in August 2016 for acute systolic HF complicated by cardiogenic shock and RLL PE. He also has a history of LV thrombus, PAF, allergic to warfarin, and sleep disturbances.   Echo 05/14/15 showed EF 10-15% with grade 3 diastolic dysfunction and LV mural thrombus. Severe RV dysfunction.  Cardiac cath 05/02/15 showed minimal nonobstructive CAD. RHC cath on 8/31 c/w low output. Was treated with milrinone which was eventually weaned off. HF med titration limited by low BP.  He developed a drug rash in the hospital. Initially thought to be due to spironolactone but persisted and then felt to be related to warfarin, transitioned to Eliquis. Discharge weight 141 pounds.  9/18 EF recovered to 45-50%  In August 2019 was seen in Clinic. Felt well but had occasional PVCs so carvedilol was restarted. ECHO was repeated in September with EF down to 25-30% from previous 45-50%.   He has continued to work full time at [employer]. Started feeling bad 2 weeks ago. Felt his heart racing but thought it would go away. He has had a couple episodes of presyncope.   Seen in HF Clinic today for add-on visit due to weakness and tachycardia.  Feels terrible. Says 2 weeks ago he started feeling "jittery".  Has had some chest tightness. Has been having presyncope. SOB with exertion for the last 2 weeks. Denies PND/Orthopnea. Appetite ok. No fever or chills.Taking all medications.   In clinic initially found to have SVT with rates in 190s. Failed vagal maneuvers. I personally gave him adenosine 6mg  then 12mg  and transitioned to AF with RVR with rates 170-190s. Admitted to ICU.   ECHO 2019 EF 25-30%  Echo 05/2017 with EF 45-50%, with Grade 1 DD.  ECHO 03/08/2016: No evidence of LV thrombus. EF 30-35%. RV  ok.  RHC 05/07/2015 RA = 9 RV = 39/5/12 PA = 41/20 (28) PCW = 18 Fick cardiac output/index = 2.6/1.4 Therms cardiac output/index = 3.6/2.0 PVR = 3.8 WU FA sat = 98% PA sat = 54%, 57  Cardiac MRI 05/05/15 1. Severely dilated left and right ventricle with severe biventricular systolic impairment. LVEF 13%  Diffuse mid wall late gadolinium enhancement in the myocardium of the left and right ventricle and in the RV attachment points to the LV consistent with dilated cardiomyopathy with biventricular involvement and decompensated heart failure. There is no evidence of inflammatory or infiltrative cardiomyopathy. 2. Severely dilated left atrium and moderately dilated right atrium. 3. Severe mitral and moderate tricuspid regurgitation. 4. Dilated main pulmonary artery consistent with pulmonary hypertension. There is severe swirling of the blood in the right ventricle suspicious of the pre-thrombotic state.  Review of Systems: [y] = yes, [ ]  = no    General: Weight gain [] ; Weight loss [ ] ; Anorexia [ ] ; Fatigue [y]; Fever [ ] ; Chills [ ] ; Weakness Marcus Bates ]   Cardiac: Chest pain/pressure [ ] ; Resting SOB [ y]; Exertional SOB [y]; Orthopnea [ ] ; Pedal Edema [] ; Palpitations Marcus Bates ]; Syncope [ ] ; Presyncope [ ] ; Paroxysmal nocturnal dyspnea[ ]    Pulmonary: Cough [ ] ; Wheezing[ ] ; Hemoptysis[ ] ; Sputum [ ] ; Snoring [ ]    GI: Vomiting[ ] ; Dysphagia[ ] ; Melena[ ] ; Hematochezia [ ] ; Heartburn[ ] ; Abdominal pain [ ] ; Constipation [ ] ; Diarrhea [ ] ;  BRBPR [ ]    GU: Hematuria[ ] ; Dysuria [ ] ; Nocturia[ ]   Vascular: Pain in legs with walking [ ] ; Pain in feet with lying flat [ ] ; Non-healing sores [ ] ; Stroke [ ] ; TIA [ ] ; Slurred speech [ ] ;   Neuro: Headaches[ ] ; Vertigo[ ] ; Seizures[ ] ; Paresthesias[ ] ;Blurred vision [ ] ; Diplopia [ ] ; Vision changes [ ]    Ortho/Skin: Arthritis [y]; Joint pain [y]; Muscle pain [ ] ; Joint swelling [ ] ; Back Pain [ ] ; Rash [ ]    Psych: Depression[ ] ;  Anxiety[ ]    Heme: Bleeding problems [ ] ; Clotting disorders [ ] ; Anemia [ ]    Endocrine: Diabetes [ ] ; Thyroid dysfunction[ ]     SH:  Social History        Socioeconomic History  . Marital status: Single    Spouse name: Not on file  . Number of children: Not on file  . Years of education: Not on file  . Highest education level: Not on file  Occupational History    Comment: Tools  Social Needs  . Financial resource strain: Not on file  . Food insecurity:    Worry: Not on file    Inability: Not on file  . Transportation needs:    Medical: Not on file    Non-medical: Not on file  Tobacco Use  . Smoking status: Never Smoker  . Smokeless tobacco: Never Used  Substance and Sexual Activity  . Alcohol use: No    Alcohol/week: 0.0 standard drinks  . Drug use: No  . Sexual activity: Not on file  Lifestyle  . Physical activity:    Days per week: Not on file    Minutes per session: Not on file  . Stress: Not on file  Relationships  . Social connections:    Talks on phone: Not on file    Gets together: Not on file    Attends religious service: Not on file    Active member of club or organization: Not on file    Attends meetings of clubs or organizations: Not on file    Relationship status: Not on file  . Intimate partner violence:    Fear of current or ex partner: Not on file    Emotionally abused: Not on file    Physically abused: Not on file    Forced sexual activity: Not on file  Other Topics Concern  . Not on file  Social History Narrative  . Not on file    FH:       Family History  Problem Relation Age of Onset  . Heart disease Other   . Pulmonary embolism Neg Hx   . CAD Maternal Grandfather        MI in his 56s maybe?  . Stroke Paternal Grandfather   . CAD Maternal Aunt        bypass at 70        Past Medical History:  Diagnosis Date  . Acute deep vein thrombosis (DVT) of popliteal vein of  right lower extremity (HCC)    a. 04/2015 -> Xarelto  . Anxiety   . Asthma   . Chronic combined systolic and diastolic CHF (congestive heart failure) (HCC)    a. 04/2015 Echo: EF 10-15%, sev diff HK, Gr 3 DD, sev MR, sev dil LA, dilated RV, mod dil RA.  . Depression   . Insomnia   . LV (left ventricular) mural thrombus    a. 04/2015 Echo: possible laminated echodense area on  inferior wall, cannot exclude laminated thrombus.  Marland Kitchen NICM (nonischemic cardiomyopathy) (HCC)    a. 04/2015 Echo: EF 10-15%, sev diff HK.  . Non-obstructive CAD    a. 04/2015 Cath: LM nl, LAD 80m, LCX 20ost, RCA 20d.  Marland Kitchen NSVT (nonsustained ventricular tachycardia) (HCC)   . Pulmonary embolism on right (HCC)    a. 04/2015 -> Xarelto  . Restless leg syndrome   . Severe mitral insufficiency    a. 04/2015 Echo: Sev MR.          Current Outpatient Medications  Medication Sig Dispense Refill  . acetaminophen (TYLENOL) 500 MG tablet Take 500 mg by mouth every 6 (six) hours as needed (calm nerves).    Marland Kitchen albuterol (PROVENTIL HFA;VENTOLIN HFA) 108 (90 BASE) MCG/ACT inhaler Inhale 2 puffs into the lungs every 4 (four) hours as needed for wheezing or shortness of breath. 1 Inhaler 2  . amphetamine-dextroamphetamine (ADDERALL) 10 MG tablet Take 10 mg by mouth as needed (takes 1/4th of tablet as needed for sleep).    . carvedilol (COREG) 3.125 MG tablet Take 1 tablet (3.125 mg total) by mouth 2 (two) times daily. 180 tablet 3  . ENTRESTO 49-51 MG TAKE 1 TABLET BY MOUTH TWICE A DAY 60 tablet 10  . Fluticasone-Salmeterol (ADVAIR) 250-50 MCG/DOSE AEPB Inhale 1 puff into the lungs 2 (two) times daily as needed (SOB, wheezing). Reported on 11/10/2015    . gabapentin (NEURONTIN) 600 MG tablet Take 300 mg by mouth daily as needed (neuropathy).     . Guarana 1000 MG TABS Take 1,000 mg by mouth daily.    . iron polysaccharides (NIFEREX) 150 MG capsule Take 150 mg by mouth daily.    . ivabradine (CORLANOR) 5 MG  TABS tablet Take 0.5 tablets (2.5 mg total) 2 (two) times daily with a meal by mouth. 30 tablet 5  . magnesium oxide (MAG-OX) 400 MG tablet Take 100 mg by mouth daily.    . Multiple Vitamins-Minerals (MULTIVITAMIN WITH MINERALS) tablet Take 1 tablet by mouth daily.    . Omega-3 Fatty Acids (FISH OIL) 1000 MG CAPS Take 1,000 mg by mouth daily. Reported on 11/10/2015    . PASSION FLOWER-VALERIAN PO Take 1 tablet by mouth daily.    . vitamin A 02725 UNIT capsule Take 25,000 Units by mouth 4 (four) times a week.     . vitamin E 100 UNIT capsule Take 100 Units by mouth 3 (three) times a week.              Current Facility-Administered Medications  Medication Dose Route Frequency Provider Last Rate Last Dose  . adenosine (ADENOCARD) 12 MG/4ML injection 12 mg  12 mg Intravenous Once Betsie Peckman, Bevelyn Buckles, MD      . adenosine (ADENOCARD) 6 MG/2ML injection 6 mg  6 mg Intravenous Once Kemal Amores, Bevelyn Buckles, MD       Social History   Socioeconomic History  . Marital status: Single    Spouse name: Not on file  . Number of children: Not on file  . Years of education: Not on file  . Highest education level: Not on file  Occupational History    Comment: Tools  Social Needs  . Financial resource strain: Not on file  . Food insecurity:    Worry: Not on file    Inability: Not on file  . Transportation needs:    Medical: Not on file    Non-medical: Not on file  Tobacco Use  . Smoking status: Never Smoker  .  Smokeless tobacco: Never Used  Substance and Sexual Activity  . Alcohol use: No    Alcohol/week: 0.0 standard drinks  . Drug use: No  . Sexual activity: Not on file  Lifestyle  . Physical activity:    Days per week: Not on file    Minutes per session: Not on file  . Stress: Not on file  Relationships  . Social connections:    Talks on phone: Not on file    Gets together: Not on file    Attends religious service: Not on file    Active member of club or organization: Not on  file    Attends meetings of clubs or organizations: Not on file    Relationship status: Not on file  Other Topics Concern  . Not on file  Social History Narrative  . Not on file   Family History  Problem Relation Age of Onset  . Heart disease Other   . Pulmonary embolism Neg Hx   . CAD Maternal Grandfather        MI in his 75s maybe?  . Stroke Paternal Grandfather   . CAD Maternal Aunt        bypass at 10        Vitals:   07/17/18 1440  BP: 98/64  Pulse: (!) 190  SpO2: 100%  Weight: 73.7 kg (162 lb 6.4 oz)       Wt Readings from Last 3 Encounters:  07/17/18 73.7 kg (162 lb 6.4 oz)  04/10/18 70.8 kg (156 lb 2 oz)  08/31/17 70.8 kg (156 lb)    PHYSICAL EXAM: General:  Appears fatigued.  No resp difficulty HEENT: normal Neck: supple. no JVD. Carotids 2+ bilat; no bruits. No lymphadenopathy or thryomegaly appreciated. Cor: PMI nondisplaced. Tachy irregular.ratte & rhythm. No rubs, gallops or murmurs. Lungs: clear on 2 liters.  Abdomen: soft, nontender, nondistended. No hepatosplenomegaly. No bruits or masses. Good bowel sounds. Extremities: no cyanosis, clubbing, rash, edema Neuro: alert & orientedx3, cranial nerves grossly intact. moves all 4 extremities w/o difficulty. Affect pleasant  EKG: SVT 190 bpm---> AFib 150 with PVCs. .   ASSESSMENT & PLAN: 1. SVT -  EKG today 190 bpm. Dr Gala Romney at the bedside. Attempt vaslva and carotid massage without success. Dr Gala Romney gave 6 mg adenosine without success. This was followed by 12 mg adenosine without success He then went into A fib RVR. See below.  BP remained stable with SBP >100. He remained A&O. Sats >90%  2. Paroxymsal A Fib RVR Transitioned from SVT to A fib RVR. Will need to admit for amio load and heparin drip. He is allergic to warfarin. Eventually put on eliquis. 3. Chronic systolic HF with severe biventricular dysfunction EF 10-15% due to NICM. Newly diagnosed 8/16. Echo 09/10/15 with EF 25%. Echo 7/17  EF 25-30%. Refused ICD. - Echo 05/2017 with EF 45-50%, with Grade 1 DD. EF back down in August on this year. ECHO 2019 25-30%. NYHA III. Volume status stable. Does not need lasix for now.  Continue  coreg 3.125 mg BID.  - Continue Entresto at 49/51 mg BID. Did not tolerate higher dose - caused insomnia - Stop corlanor.  - Failed spiro in past. Had rash.  4. . RLL pulmonary embolism  - In setting of low EF. Treated ~ 1year. Now off Eliquis. No change.  5.  Allergy  Warfarin and ASA allergies  6. Mild nonobstructive CAD by cath 04/2015 - Having chest tightness.. Cycle troponin. He is allergic to  aspirin. Plan to start eliquis soon.  7. . MR - Repeat ECHO this admit.  8.  LV mural thrombus - LV thrombus resolved on echo 05/2017. Eliquis stopped previously. No change. 9. 8. Sleep Disturbances - Follows with Sleep Doctor every 6 months.  9.Frequnet PVCs.  Loading on Amiodarone.    Admit to ICU. Load amio and start amiodarone + heparin. Of note he is allergic to warfarin, spironolactone and aspirin. .   Check labs now. Check CBC, CMET,TSH, BNP, troponin now.   Hold bb and entresto.Tonye Becket, NP  3:37 PM   Patient seen and examined with the above-signed Advanced Practice Provider and/or Housestaff. I personally reviewed laboratory data, imaging studies and relevant notes. I independently examined the patient and formulated the important aspects of the plan. I have edited the note to reflect any of my changes or salient points. I have personally discussed the plan with the patient and/or family.  63 y/o male with h/o systolic HF due to NICM. Presents with 2 week h/o fatigue and palpitations. In clinic initially found to have SVT with rates in 190s. Failed vagal maneuvers. I personally gave him adenosine 6mg  then 12mg  and transitioned to AF with RVR with rates 170-190s.  Admitted to ICU.   On exam fatigued appearing. JVP 9-10 Cor tachy irregular possible s3 Lungs CTA Ab soft NT Ext  warm NT  Will admit to ICU. Start heparin and IV amio. Will get echo. Susepct EF will be markedly depressed.  Volume status not too bad now. Will hold off on lasix until HR improves unless he gets more symptomatic. If doesn't convert with amio will need TEE/DC-CV. Check electrolytes and TSH.  [name], MD  5:36 PM

## 2018-07-17 NOTE — Progress Notes (Addendum)
ANTICOAGULATION CONSULT NOTE - Initial Consult  Pharmacy Consult for heparin Indication: atrial fibrillation  Allergies  Allergen Reactions  . Spironolactone Rash  . Aspirin     REACTION: facial swelling  . Dust Mite Extract   . Other     Cats   . Peanuts [Peanut Oil]     sob  . Warfarin And Related Rash    Patient Measurements: Height: 5' 8.5" (174 cm) Weight: 162 lb 7.7 oz (73.7 kg) IBW/kg (Calculated) : 69.55 Heparin Dosing Weight: 73.7 kg  Vital Signs: BP: 98/64 (11/11 1440) Pulse Rate: 190 (11/11 1440)  Labs: Recent Labs    07/17/18 1545  HGB 12.2*  HCT 38.5*  PLT 202    CrCl cannot be calculated (Patient's most recent lab result is older than the maximum 21 days allowed.).   Medical History: Past Medical History:  Diagnosis Date  . Acute deep vein thrombosis (DVT) of popliteal vein of right lower extremity (HCC)    a. 04/2015 -> Xarelto  . Anxiety   . Asthma   . Chronic combined systolic and diastolic CHF (congestive heart failure) (HCC)    a. 04/2015 Echo: EF 10-15%, sev diff HK, Gr 3 DD, sev MR, sev dil LA, dilated RV, mod dil RA.  . Depression   . Insomnia   . LV (left ventricular) mural thrombus    a. 04/2015 Echo: possible laminated echodense area on inferior wall, cannot exclude laminated thrombus.  Marland Kitchen NICM (nonischemic cardiomyopathy) (HCC)    a. 04/2015 Echo: EF 10-15%, sev diff HK.  . Non-obstructive CAD    a. 04/2015 Cath: LM nl, LAD 49m, LCX 20ost, RCA 20d.  Marland Kitchen NSVT (nonsustained ventricular tachycardia) (HCC)   . Pulmonary embolism on right (HCC)    a. 04/2015 -> Xarelto  . Restless leg syndrome   . Severe mitral insufficiency    a. 04/2015 Echo: Sev MR.    Medications:  Scheduled:  . heparin  3,700 Units Intravenous Once  . sodium chloride flush  3 mL Intravenous Q12H    Assessment: Marcus Bates with hx of CHF (LVEF 25-30%), hx of LV thrombus in 2018 + hx PE; known allergy to warfarin (rash) and was transitioned to apixaban in 2016 - no  longer on anticoagulation PTA.   Found to be in SVT today followed by Afib s/p adenosine. Starting on heparin infusion with plans for apixaban in future. Hgb 12.2, plt 202. No s/sx of bleeding.   Goal of Therapy:  Heparin level 0.3-0.7 units/ml Monitor platelets by anticoagulation protocol: Yes   Plan:  Give 3700 units bolus x 1 Start heparin infusion at 950 units/hr Check anti-Xa level in 6 hours and daily while on heparin Continue to monitor H&H and platelets  Girard Cooter, PharmD Clinical Pharmacist  Pager: [phone] Phone: [phone] 07/17/2018,4:36 PM

## 2018-07-17 NOTE — Progress Notes (Signed)
Patient ID: Marcus Bates, male   DOB: [birth year], 62 y.o.   MRN: [id number]    Advanced Heart Failure Clinic Note   PCP: Fulton Mole Primary Cardiologist: Bensimhon  HPI: [name] is a 62 -year-old male with no significant PMHx who was admitted in August 2016 for acute systolic HF complicated by cardiogenic shock and RLL PE. He also has a history of LV thrombus, PAF, allergic to warfarin, and sleep disturbances.   Echo 05/14/15 showed EF 10-15% with grade 3 diastolic dysfunction and LV mural thrombus. Severe RV dysfunction.  Cardiac cath 05/02/15 showed minimal nonobstructive CAD. RHC cath on 8/31 c/w low output. Was treated with milrinone which was eventually weaned off. HF med titration limited by low BP.  He developed a drug rash in the hospital. Initially thought to be due to spironolactone but persisted and then felt to be related to warfarin, transitioned to Eliquis. Discharge weight 141 pounds.  Today he returns for HF follow up. Feels terrible. Says 2 weeks ago he started feeling "jittery". Haas had chest tightness. Has been having presyncope. SOB with exertion for the last 2 weeks. Denies PND/Orthopnea. Appetite ok. No fever or chills.Taking all medications.   ECHO 2019 EF 25-30%  Echo 05/2017 with EF 45-50%, with Grade 1 DD.  ECHO 03/08/2016: No evidence of LV thrombus. EF 30-35%. RV ok.  RHC 05/07/2015 RA = 9 RV = 39/5/12 PA = 41/20 (28) PCW = 18 Fick cardiac output/index = 2.6/1.4 Therms cardiac output/index = 3.6/2.0 PVR = 3.8 WU FA sat = 98% PA sat = 54%, 57  Cardiac MRI 05/05/15 1. Severely dilated left and right ventricle with severe biventricular systolic impairment. LVEF 13%  Diffuse mid wall late gadolinium enhancement in the myocardium of the left and right ventricle and in the RV attachment points to the LV consistent with dilated cardiomyopathy with biventricular involvement and decompensated heart failure. There is no evidence of inflammatory or infiltrative  cardiomyopathy. 2. Severely dilated left atrium and moderately dilated right atrium. 3. Severe mitral and moderate tricuspid regurgitation. 4. Dilated main pulmonary artery consistent with pulmonary hypertension. There is severe swirling of the blood in the right ventricle suspicious of the pre-thrombotic state.  ECHO 03/08/2016: No evidence of LV thrombus. EF 30-35%. RV ok.  Review of systems complete and found to be negative unless listed in HPI.   SH:  Social History   Socioeconomic History  . Marital status: Single    Spouse name: Not on file  . Number of children: Not on file  . Years of education: Not on file  . Highest education level: Not on file  Occupational History    Comment: Tools  Social Needs  . Financial resource strain: Not on file  . Food insecurity:    Worry: Not on file    Inability: Not on file  . Transportation needs:    Medical: Not on file    Non-medical: Not on file  Tobacco Use  . Smoking status: Never Smoker  . Smokeless tobacco: Never Used  Substance and Sexual Activity  . Alcohol use: No    Alcohol/week: 0.0 standard drinks  . Drug use: No  . Sexual activity: Not on file  Lifestyle  . Physical activity:    Days per week: Not on file    Minutes per session: Not on file  . Stress: Not on file  Relationships  . Social connections:    Talks on phone: Not on file    Gets together: Not on file  Attends religious service: Not on file    Active member of club or organization: Not on file    Attends meetings of clubs or organizations: Not on file    Relationship status: Not on file  . Intimate partner violence:    Fear of current or ex partner: Not on file    Emotionally abused: Not on file    Physically abused: Not on file    Forced sexual activity: Not on file  Other Topics Concern  . Not on file  Social History Narrative  . Not on file    FH:  Family History  Problem Relation Age of Onset  . Heart disease Other   . Pulmonary  embolism Neg Hx   . CAD Maternal Grandfather        MI in his 58s maybe?  . Stroke Paternal Grandfather   . CAD Maternal Aunt        bypass at 46    Past Medical History:  Diagnosis Date  . Acute deep vein thrombosis (DVT) of popliteal vein of right lower extremity (HCC)    a. 04/2015 -> Xarelto  . Anxiety   . Asthma   . Chronic combined systolic and diastolic CHF (congestive heart failure) (HCC)    a. 04/2015 Echo: EF 10-15%, sev diff HK, Gr 3 DD, sev MR, sev dil LA, dilated RV, mod dil RA.  . Depression   . Insomnia   . LV (left ventricular) mural thrombus    a. 04/2015 Echo: possible laminated echodense area on inferior wall, cannot exclude laminated thrombus.  Marland Kitchen NICM (nonischemic cardiomyopathy) (HCC)    a. 04/2015 Echo: EF 10-15%, sev diff HK.  . Non-obstructive CAD    a. 04/2015 Cath: LM nl, LAD 51m, LCX 20ost, RCA 20d.  Marland Kitchen NSVT (nonsustained ventricular tachycardia) (HCC)   . Pulmonary embolism on right (HCC)    a. 04/2015 -> Xarelto  . Restless leg syndrome   . Severe mitral insufficiency    a. 04/2015 Echo: Sev MR.    Current Outpatient Medications  Medication Sig Dispense Refill  . acetaminophen (TYLENOL) 500 MG tablet Take 500 mg by mouth every 6 (six) hours as needed (calm nerves).    Marland Kitchen albuterol (PROVENTIL HFA;VENTOLIN HFA) 108 (90 BASE) MCG/ACT inhaler Inhale 2 puffs into the lungs every 4 (four) hours as needed for wheezing or shortness of breath. 1 Inhaler 2  . amphetamine-dextroamphetamine (ADDERALL) 10 MG tablet Take 10 mg by mouth as needed (takes 1/4th of tablet as needed for sleep).    . carvedilol (COREG) 3.125 MG tablet Take 1 tablet (3.125 mg total) by mouth 2 (two) times daily. 180 tablet 3  . ENTRESTO 49-51 MG TAKE 1 TABLET BY MOUTH TWICE A DAY 60 tablet 10  . Fluticasone-Salmeterol (ADVAIR) 250-50 MCG/DOSE AEPB Inhale 1 puff into the lungs 2 (two) times daily as needed (SOB, wheezing). Reported on 11/10/2015    . gabapentin (NEURONTIN) 600 MG tablet Take 300  mg by mouth daily as needed (neuropathy).     . Guarana 1000 MG TABS Take 1,000 mg by mouth daily.    . iron polysaccharides (NIFEREX) 150 MG capsule Take 150 mg by mouth daily.    . ivabradine (CORLANOR) 5 MG TABS tablet Take 0.5 tablets (2.5 mg total) 2 (two) times daily with a meal by mouth. 30 tablet 5  . magnesium oxide (MAG-OX) 400 MG tablet Take 100 mg by mouth daily.    . Multiple Vitamins-Minerals (MULTIVITAMIN WITH MINERALS) tablet Take  1 tablet by mouth daily.    . Omega-3 Fatty Acids (FISH OIL) 1000 MG CAPS Take 1,000 mg by mouth daily. Reported on 11/10/2015    . PASSION FLOWER-VALERIAN PO Take 1 tablet by mouth daily.    . vitamin A 21308 UNIT capsule Take 25,000 Units by mouth 4 (four) times a week.     . vitamin E 100 UNIT capsule Take 100 Units by mouth 3 (three) times a week.     Current Facility-Administered Medications  Medication Dose Route Frequency Provider Last Rate Last Dose  . adenosine (ADENOCARD) 12 MG/4ML injection 12 mg  12 mg Intravenous Once Bensimhon, Bevelyn Buckles, MD      . adenosine (ADENOCARD) 6 MG/2ML injection 6 mg  6 mg Intravenous Once Bensimhon, Bevelyn Buckles, MD        Vitals:   07/17/18 1440  BP: 98/64  Pulse: (!) 190  SpO2: 100%  Weight: 73.7 kg (162 lb 6.4 oz)    Wt Readings from Last 3 Encounters:  07/17/18 73.7 kg (162 lb 6.4 oz)  04/10/18 70.8 kg (156 lb 2 oz)  08/31/17 70.8 kg (156 lb)    PHYSICAL EXAM: General:  Appears fatigued.  No resp difficulty HEENT: normal Neck: supple. no JVD. Carotids 2+ bilat; no bruits. No lymphadenopathy or thryomegaly appreciated. Cor: PMI nondisplaced. Tachy irregular.ratte & rhythm. No rubs, gallops or murmurs. Lungs: clear on 2 liters.  Abdomen: soft, nontender, nondistended. No hepatosplenomegaly. No bruits or masses. Good bowel sounds. Extremities: no cyanosis, clubbing, rash, edema Neuro: alert & orientedx3, cranial nerves grossly intact. moves all 4 extremities w/o difficulty. Affect pleasant  EKG: SVT  190 bpm---> AFib 150 with PVCs. .   ASSESSMENT & PLAN: 1. SVT -  EKG today 190 bpm. Dr Gala Romney at the bedside. Attempt vaslva and carotid massage without success. Dr Gala Romney gave 6 mg adenosine without success. This was followed by 12 mg adenosine without success He then went into A fib RV/  BP remained stable with SBP >100. He remained A&O. Sats >90%  2. A Fib RVR Transitioned from SVT to A fib RVR. Will need to admit for amio load and heparin drip. He is allergic to warfarin. Eventually put on eliquis. 3. Chronic systolic HF with severe biventricular dysfunction EF 10-15% due to NICM. Newly diagnosed 8/16. Echo 09/10/15 with EF 25%. Echo 7/17 EF 25-30%. Refused ICD. - Echo 05/2017 with EF 45-50%, with Grade 1 DD. EF back down in August on this year. ECHO 2019 25-30%. NYHA III. Volume status stable. Does not need lasix for now.  Continue  coreg 3.125 mg BID.  - Continue Entresto at 49/51 mg BID. Did not tolerate higher dose - caused insomnia - Stop corlanor.  - Failed spiro in past. Had rash.  4. . RLL pulmonary embolism  - In setting of low EF. Treated ~ 1year. Now off Eliquis. No change.  5.  Allergy  Warfarin and ASA allergies  6. Mild nonobstructive CAD by cath 04/2015 - Having chest tightness.. Cycle troponin.  7. . MR - Repeat ECHO this admit.  8.  LV mural thrombus - LV thrombus resolved on echo 05/2017. Eliquis stopped previously. No change. 9. 8. Sleep Disturbances - Follows with Sleep Doctor every 6 months.  9.Frequnet PVCs.  Loading on Amiodarone.    Admit to ICU. Load amio and start amiodarone.   Check labs now. Check CBC, CMET,TSH, BNP, troponin now.    Tonye Becket, NP  3:37 PM

## 2018-07-17 NOTE — Progress Notes (Signed)
  Amiodarone Drug - Drug Interaction Consult Note  Recommendations: No medication adjustments given current orders. On coreg PTA- will monitor. Currently on heparin infusion with plans for possible apixaban in future (allergy to warfarin therapy). Monitor vital signs and for any future interactions.   Amiodarone is metabolized by the cytochrome P450 system and therefore has the potential to cause many drug interactions. Amiodarone has an average plasma half-life of 50 days (range 20 to 100 days).   There is potential for drug interactions to occur several weeks or months after stopping treatment and the onset of drug interactions may be slow after initiating amiodarone.   []  Statins: Increased risk of myopathy. Simvastatin- restrict dose to 20mg  daily. Other statins: counsel patients to report any muscle pain or weakness immediately.  []  Anticoagulants: Amiodarone can increase anticoagulant effect. Consider warfarin dose reduction. Patients should be monitored closely and the dose of anticoagulant altered accordingly, remembering that amiodarone levels take several weeks to stabilize.  []  Antiepileptics: Amiodarone can increase plasma concentration of phenytoin, the dose should be reduced. Note that small changes in phenytoin dose can result in large changes in levels. Monitor patient and counsel on signs of toxicity.  []  Beta blockers: increased risk of bradycardia, AV block and myocardial depression. Sotalol - avoid concomitant use.  []   Calcium channel blockers (diltiazem and verapamil): increased risk of bradycardia, AV block and myocardial depression.  []   Cyclosporine: Amiodarone increases levels of cyclosporine. Reduced dose of cyclosporine is recommended.  []  Digoxin dose should be halved when amiodarone is started.  []  Diuretics: increased risk of cardiotoxicity if hypokalemia occurs.  []  Oral hypoglycemic agents (glyburide, glipizide, glimepiride): increased risk of hypoglycemia.  Patient's glucose levels should be monitored closely when initiating amiodarone therapy.   []  Drugs that prolong the QT interval:  Torsades de pointes risk may be increased with concurrent use - avoid if possible.  Monitor QTc, also keep magnesium/potassium WNL if concurrent therapy can't be avoided. Marland Kitchen Antibiotics: e.g. fluoroquinolones, erythromycin. . Antiarrhythmics: e.g. quinidine, procainamide, disopyramide, sotalol. . Antipsychotics: e.g. phenothiazines, haloperidol.  . Lithium, tricyclic antidepressants, and methadone.  Thank You,  Clarnce Flock  07/17/2018 4:40 PM

## 2018-07-17 NOTE — Progress Notes (Signed)
Cardiology fellow [facility] paged. Patient complaining of SOB. HR 140's, BP 100/73, O2 sat 98% on 3L, RR 12. Orders received to continue amio drip at current dose 60mg /hr and to decrease it to 30mg /hr at 0230 as long as patients BP tolerates. Will continue to monitor closely.

## 2018-07-17 NOTE — Progress Notes (Signed)
Dr. Gala Romney updated on patient's shortness of breath - order received to give 40 mg IV lasix. Will implement and continue to monitor patient closely.

## 2018-07-18 ENCOUNTER — Inpatient Hospital Stay (HOSPITAL_COMMUNITY): Payer: BLUE CROSS/BLUE SHIELD

## 2018-07-18 ENCOUNTER — Inpatient Hospital Stay: Payer: Self-pay

## 2018-07-18 DIAGNOSIS — I340 Nonrheumatic mitral (valve) insufficiency: Secondary | ICD-10-CM

## 2018-07-18 LAB — COOXEMETRY PANEL
CARBOXYHEMOGLOBIN: 1 % (ref 0.5–1.5)
Carboxyhemoglobin: 0.5 % (ref 0.5–1.5)
Methemoglobin: 1.6 % — ABNORMAL HIGH (ref 0.0–1.5)
Methemoglobin: 0.8 % (ref 0.0–1.5)
O2 SAT: 43.7 %
O2 SAT: 58 %
TOTAL HEMOGLOBIN: 13.7 g/dL (ref 12.0–16.0)
Total hemoglobin: 13.7 g/dL (ref 12.0–16.0)

## 2018-07-18 LAB — BASIC METABOLIC PANEL
ANION GAP: 12 (ref 5–15)
ANION GAP: 14 (ref 5–15)
BUN: 15 mg/dL (ref 8–23)
BUN: 21 mg/dL (ref 8–23)
CALCIUM: 8.7 mg/dL — AB (ref 8.9–10.3)
CALCIUM: 8.4 mg/dL — AB (ref 8.9–10.3)
CO2: 22 mmol/L (ref 22–32)
CO2: 19 mmol/L — ABNORMAL LOW (ref 22–32)
CREATININE: 1.77 mg/dL — AB (ref 0.61–1.24)
Chloride: 104 mmol/L (ref 98–111)
Chloride: 100 mmol/L (ref 98–111)
Creatinine, Ser: 1.35 mg/dL — ABNORMAL HIGH (ref 0.61–1.24)
GFR, EST AFRICAN AMERICAN: 45 mL/min — AB (ref >60)
GFR, EST NON AFRICAN AMERICAN: 54 mL/min — AB (ref >60)
GFR, EST NON AFRICAN AMERICAN: 39 mL/min — AB (ref >60)
GLUCOSE: 111 mg/dL — AB (ref 70–99)
GLUCOSE: 169 mg/dL — AB (ref 70–99)
POTASSIUM: 4.6 mmol/L (ref 3.5–5.1)
Potassium: 5.8 mmol/L — ABNORMAL HIGH (ref 3.5–5.1)
Sodium: 138 mmol/L (ref 135–145)
Sodium: 133 mmol/L — ABNORMAL LOW (ref 135–145)

## 2018-07-18 LAB — COMPREHENSIVE METABOLIC PANEL
ALT: 590 U/L — ABNORMAL HIGH (ref 0–44)
AST: 523 U/L — ABNORMAL HIGH (ref 15–41)
Albumin: 3.5 g/dL (ref 3.5–5.0)
Alkaline Phosphatase: 53 U/L (ref 38–126)
Anion gap: 12 (ref 5–15)
BUN: 23 mg/dL (ref 8–23)
CHLORIDE: 102 mmol/L (ref 98–111)
CO2: 20 mmol/L — AB (ref 22–32)
Calcium: 8.2 mg/dL — ABNORMAL LOW (ref 8.9–10.3)
Creatinine, Ser: 1.77 mg/dL — ABNORMAL HIGH (ref 0.61–1.24)
GFR calc Af Amer: 45 mL/min — ABNORMAL LOW (ref >60)
GFR calc non Af Amer: 39 mL/min — ABNORMAL LOW (ref >60)
GLUCOSE: 163 mg/dL — AB (ref 70–99)
Potassium: 4.7 mmol/L (ref 3.5–5.1)
SODIUM: 134 mmol/L — AB (ref 135–145)
Total Bilirubin: 0.6 mg/dL (ref 0.3–1.2)
Total Protein: 6.3 g/dL — ABNORMAL LOW (ref 6.5–8.1)

## 2018-07-18 LAB — CBC
HCT: 40.9 % (ref 39.0–52.0)
HCT: 41.7 % (ref 39.0–52.0)
HEMOGLOBIN: 12.8 g/dL — AB (ref 13.0–17.0)
Hemoglobin: 13.2 g/dL (ref 13.0–17.0)
MCH: 29.1 pg (ref 26.0–34.0)
MCH: 29.2 pg (ref 26.0–34.0)
MCHC: 31.3 g/dL (ref 30.0–36.0)
MCHC: 31.7 g/dL (ref 30.0–36.0)
MCV: 93 fL (ref 80.0–100.0)
MCV: 92.3 fL (ref 80.0–100.0)
NRBC: 0 % (ref 0.0–0.2)
PLATELETS: 217 10*3/uL (ref 150–400)
Platelets: 247 10*3/uL (ref 150–400)
RBC: 4.4 MIL/uL (ref 4.22–5.81)
RBC: 4.52 MIL/uL (ref 4.22–5.81)
RDW: 14.1 % (ref 11.5–15.5)
RDW: 14.3 % (ref 11.5–15.5)
WBC: 10.2 10*3/uL (ref 4.0–10.5)
WBC: 11.7 10*3/uL — ABNORMAL HIGH (ref 4.0–10.5)
nRBC: 0 % (ref 0.0–0.2)

## 2018-07-18 LAB — HEPARIN LEVEL (UNFRACTIONATED)
HEPARIN UNFRACTIONATED: 0.13 [IU]/mL — AB (ref 0.30–0.70)
Heparin Unfractionated: 0.22 IU/mL — ABNORMAL LOW (ref 0.30–0.70)

## 2018-07-18 LAB — LACTIC ACID, PLASMA
Lactic Acid, Venous: 2 mmol/L (ref 0.5–1.9)
Lactic Acid, Venous: 2 mmol/L (ref 0.5–1.9)

## 2018-07-18 LAB — ECHOCARDIOGRAM LIMITED
Height: 68.5 in
Weight: 2470.4 oz

## 2018-07-18 LAB — HIV ANTIBODY (ROUTINE TESTING W REFLEX): HIV Screen 4th Generation wRfx: NONREACTIVE

## 2018-07-18 LAB — MAGNESIUM: Magnesium: 1.9 mg/dL (ref 1.7–2.4)

## 2018-07-18 MED ORDER — MIDAZOLAM HCL 2 MG/2ML IJ SOLN
INTRAMUSCULAR | Status: AC
  Administered 2018-07-18: 2 mg
  Filled 2018-07-18: qty 2

## 2018-07-18 MED ORDER — MILRINONE LACTATE IN DEXTROSE 20-5 MG/100ML-% IV SOLN
0.3750 ug/kg/min | INTRAVENOUS | Status: DC
  Administered 2018-07-18: 0.375 ug/kg/min via INTRAVENOUS
  Administered 2018-07-19: 0.25 ug/kg/min via INTRAVENOUS
  Filled 2018-07-18 (×2): qty 100

## 2018-07-18 MED ORDER — AMIODARONE LOAD VIA INFUSION
150.0000 mg | Freq: Once | INTRAVENOUS | Status: AC
  Administered 2018-07-18: 150 mg via INTRAVENOUS
  Filled 2018-07-18: qty 83.34

## 2018-07-18 MED ORDER — POLYSACCHARIDE IRON COMPLEX 150 MG PO CAPS
150.0000 mg | ORAL_CAPSULE | Freq: Every day | ORAL | Status: DC
  Administered 2018-07-18: 150 mg via ORAL
  Filled 2018-07-18 (×2): qty 1

## 2018-07-18 MED ORDER — MILRINONE LACTATE IN DEXTROSE 20-5 MG/100ML-% IV SOLN: 0.2500 ug/kg/min | INTRAVENOUS | Status: DC

## 2018-07-18 MED ORDER — AMIODARONE HCL 200 MG PO TABS
200.0000 mg | ORAL_TABLET | Freq: Two times a day (BID) | ORAL | Status: DC
  Administered 2018-07-18: 200 mg via ORAL
  Filled 2018-07-18 (×2): qty 1

## 2018-07-18 MED ORDER — NOREPINEPHRINE 4 MG/250ML-% IV SOLN
0.0000 ug/min | INTRAVENOUS | Status: DC
  Administered 2018-07-18: 1 ug/min via INTRAVENOUS
  Filled 2018-07-18 (×2): qty 250

## 2018-07-18 MED ORDER — FENTANYL CITRATE (PF) 100 MCG/2ML IJ SOLN
INTRAMUSCULAR | Status: AC
  Administered 2018-07-18: 100 ug
  Filled 2018-07-18: qty 2

## 2018-07-18 MED ORDER — HEPARIN BOLUS VIA INFUSION
2000.0000 [IU] | Freq: Once | INTRAVENOUS | Status: AC
  Administered 2018-07-18: 2000 [IU] via INTRAVENOUS
  Filled 2018-07-18: qty 2000

## 2018-07-18 MED ORDER — FUROSEMIDE 10 MG/ML IJ SOLN
80.0000 mg | Freq: Once | INTRAMUSCULAR | Status: AC
  Administered 2018-07-18: 80 mg via INTRAVENOUS
  Filled 2018-07-18: qty 8

## 2018-07-18 MED ORDER — LIDOCAINE HCL (PF) 1 % IJ SOLN
INTRAMUSCULAR | Status: AC
  Administered 2018-07-18: 30 mL
  Filled 2018-07-18: qty 30

## 2018-07-18 MED ORDER — MILRINONE LACTATE IN DEXTROSE 20-5 MG/100ML-% IV SOLN
0.2500 ug/kg/min | INTRAVENOUS | Status: DC
  Administered 2018-07-18: 0.125 ug/kg/min via INTRAVENOUS
  Filled 2018-07-18: qty 100

## 2018-07-18 MED ORDER — GABAPENTIN 300 MG PO CAPS
300.0000 mg | ORAL_CAPSULE | Freq: Every day | ORAL | Status: DC | PRN
  Administered 2018-07-18 – 2018-07-26 (×7): 300 mg via ORAL
  Filled 2018-07-18 (×7): qty 1

## 2018-07-18 MED ORDER — NOREPINEPHRINE 16 MG/250ML-% IV SOLN
0.0000 ug/min | INTRAVENOUS | Status: DC
  Administered 2018-07-18 – 2018-07-19 (×2): 15 ug/min via INTRAVENOUS
  Filled 2018-07-18 (×3): qty 250

## 2018-07-18 NOTE — Progress Notes (Signed)
Dr. Gala Romney at bedside - Levophed @ 15 mcg, Amiodarone off, Heparin held for CL placement, and Milrinone @ 0.25.  Patient converted to NSR 70s-80s.  Zofran given for nausea.

## 2018-07-18 NOTE — Progress Notes (Addendum)
Coox 43.7 reported to Dr. Gala Romney. Verbal order received: Increase Milrinone to 0.375 mcg/kg/min.

## 2018-07-18 NOTE — Progress Notes (Signed)
ANTICOAGULATION CONSULT NOTE - Follow-up Consult  Pharmacy Consult for heparin Indication: atrial fibrillation  Allergies  Allergen Reactions  . Spironolactone Rash  . Aspirin     REACTION: facial swelling  . Dust Mite Extract   . Other     Cats   . Peanuts [Peanut Oil]     sob  . Warfarin And Related Rash    Patient Measurements: Height: 5' 8.5" (174 cm) Weight: 154 lb 6.4 oz (70 kg) IBW/kg (Calculated) : 69.55 Heparin Dosing Weight: 73.7 kg  Vital Signs: Temp: 98.2 F (36.8 C) (11/12 0733) Temp Source: Oral (11/12 0733) BP: 112/79 (11/12 1400) Pulse Rate: 80 (11/12 1400)  Labs: Recent Labs    07/17/18 1545 07/17/18 2319 07/17/18 2327 07/18/18 0924  HGB 12.2*  --  12.8* 13.2  HCT 38.5*  --  40.9 41.7  PLT 202  --  217 247  HEPARINUNFRC  --  0.13*  --  <0.10*  CREATININE 1.30*  --  1.35* 1.77*  TROPONINI <0.03  --   --   --     Estimated Creatinine Clearance: 42.1 mL/min (A) (by C-G formula based on SCr of 1.77 mg/dL (H)).   Medical History: Past Medical History:  Diagnosis Date  . Acute deep vein thrombosis (DVT) of popliteal vein of right lower extremity (HCC)    a. 04/2015 -> Xarelto  . Anxiety   . Asthma   . Chronic combined systolic and diastolic CHF (congestive heart failure) (HCC)    a. 04/2015 Echo: EF 10-15%, sev diff HK, Gr 3 DD, sev MR, sev dil LA, dilated RV, mod dil RA.  . Depression   . Insomnia   . LV (left ventricular) mural thrombus    a. 04/2015 Echo: possible laminated echodense area on inferior wall, cannot exclude laminated thrombus.  Marland Kitchen NICM (nonischemic cardiomyopathy) (HCC)    a. 04/2015 Echo: EF 10-15%, sev diff HK.  . Non-obstructive CAD    a. 04/2015 Cath: LM nl, LAD 52m, LCX 20ost, RCA 20d.  Marland Kitchen NSVT (nonsustained ventricular tachycardia) (HCC)   . Pulmonary embolism on right (HCC)    a. 04/2015 -> Xarelto  . Restless leg syndrome   . Severe mitral insufficiency    a. 04/2015 Echo: Sev MR.    Medications:  Scheduled:  .  iron polysaccharides  150 mg Oral Daily  . mometasone-formoterol  2 puff Inhalation BID  . sodium chloride flush  3 mL Intravenous Q12H    Assessment: 62 yom with hx of CHF (LVEF 25-30%), hx of LV thrombus in 2018 + hx PE; known allergy to warfarin (rash) and was transitioned to apixaban in 2016 - no longer on anticoagulation PTA. Found to be in SVT followed by Afib s/p adenosine. Starting on heparin infusion with plans for apixaban in future  Heparin level this morning was <0.1. This, however, was after heparin was held for about 1 hr for line placement and then the level drawn. Drip was restarted around 1200, so new level ordered for 1800.  Goal of Therapy:  Heparin level 0.3-0.7 units/ml Monitor platelets by anticoagulation protocol: Yes   Plan:  Continue heparin infusion at 1200 units/hr Check anti-Xa level in 6 hours and daily while on heparin Continue to monitor H&H and platelets  Arvilla Market, PharmD PGY1 Pharmacy Resident Phone [phone] 07/18/2018     2:31 PM

## 2018-07-18 NOTE — Progress Notes (Signed)
ANTICOAGULATION CONSULT NOTE Pharmacy Consult for heparin Indication: atrial fibrillation  Allergies  Allergen Reactions  . Spironolactone Rash  . Aspirin     REACTION: facial swelling  . Dust Mite Extract   . Other     Cats   . Peanuts [Peanut Oil]     sob  . Warfarin And Related Rash    Patient Measurements: Height: 5' 8.5" (174 cm) Weight: 147 lb 4.3 oz (66.8 kg) IBW/kg (Calculated) : 69.55 Heparin Dosing Weight: 73.7 kg  Vital Signs: Temp: 100 F (37.8 C) (11/12 0008) Temp Source: Oral (11/12 0008) BP: 100/83 (11/12 0000) Pulse Rate: 153 (11/12 0000)  Labs: Recent Labs    07/17/18 1545 07/17/18 2319  HGB 12.2*  --   HCT 38.5*  --   PLT 202  --   HEPARINUNFRC  --  0.13*  CREATININE 1.30*  --   TROPONINI <0.03  --     Estimated Creatinine Clearance: 55 mL/min (A) (by C-G formula based on SCr of 1.3 mg/dL (H)).  Assessment: 62 y.o. male with Afib for heparin  Goal of Therapy:  Heparin level 0.3-0.7 units/ml Monitor platelets by anticoagulation protocol: Yes   Plan:  Heparin 2000 units IV bolus, then increase heparin 1200 units/hr Follow-up am labs.   Geannie Risen, PharmD, BCPS  07/18/2018,12:29 AM

## 2018-07-18 NOTE — Progress Notes (Signed)
Spoke to [employer] who states the MD placed an IJ CVC and that the PICC would not be needed at this time. Order will be cancled. Consuello Masse

## 2018-07-18 NOTE — Progress Notes (Addendum)
Advanced Heart Failure Rounding Note  PCP-Cardiologist: No primary care provider on file.   Subjective:    Remains in afib 130-140s on amio drip @ 30 mg/hr and heparin gtt. Received amio bolus x3. SBP 83-100s.   Received 40 mg IV lasix x1. Net positive 100 mls.   Labs last night: K 4.6, mag 2.0, BNP elevated 2,004. TSH normal. AM labs pending.   Dyspneic overnight. Lasix and inhaler did not improve symptoms. Having nausea with dry heaving. Still has chest tightness. Did not sleep.    Objective:   Weight Range: 70 kg Body mass index is 23.13 kg/m.   Vital Signs:   Temp:  [98 F (36.7 C)-100 F (37.8 C)] 98.5 F (36.9 C) (11/12 0336) Pulse Rate:  [118-190] 146 (11/12 0700) Resp:  [8-23] 21 (11/12 0700) BP: (83-189)/(62-123) 83/62 (11/12 0700) SpO2:  [95 %-100 %] 98 % (11/12 0700) Weight:  [66.8 kg-73.7 kg] 70 kg (11/12 0500) Last BM Date: 07/16/18  Weight change: Filed Weights   07/17/18 1600 07/17/18 1650 07/18/18 0500  Weight: 73.7 kg 66.8 kg 70 kg    Intake/Output:   Intake/Output Summary (Last 24 hours) at 07/18/2018 0703 Last data filed at 07/18/2018 0600 Gross per 24 hour  Intake 1128.02 ml  Output 1000 ml  Net 128.02 ml      Physical Exam    General:  Appears uncomfortable. No resp difficulty HEENT: Normal Neck: Supple. JVP flat. Carotids 2+ bilat; no bruits. No lymphadenopathy or thyromegaly appreciated. Cor: PMI nondisplaced. Tachy, irregular rhythm. No rubs, gallops or murmurs. Lungs: Clear. Tachypneic.  Abdomen: Soft, nontender, nondistended. No hepatosplenomegaly. No bruits or masses. Good bowel sounds. Extremities: No cyanosis, clubbing, rash, edema Neuro: Alert & orientedx3, cranial nerves grossly intact. moves all 4 extremities w/o difficulty. Affect pleasant   Telemetry   Afib 130-160s. Personally reviewed.   EKG    EKG 11/11: Afib RVR 151.  Labs    CBC Recent Labs    07/17/18 1545 07/17/18 2327  WBC 7.7 10.2  HGB 12.2*  12.8*  HCT 38.5* 40.9  MCV 93.7 93.0  PLT 202 217   Basic Metabolic Panel Recent Labs    16/10/96 1545 07/17/18 2125 07/17/18 2327  NA 137  --  138  K 3.8  --  4.6  CL 107  --  104  CO2 21*  --  22  GLUCOSE 128*  --  111*  BUN 17  --  15  CREATININE 1.30*  --  1.35*  CALCIUM 8.4*  --  8.7*  MG  --  2.0  --    Liver Function Tests Recent Labs    07/17/18 1545  AST 136*  ALT 151*  ALKPHOS 49  BILITOT 0.7  PROT 5.9*  ALBUMIN 3.5   No results for input(s): LIPASE, AMYLASE in the last 72 hours. Cardiac Enzymes Recent Labs    07/17/18 1545  TROPONINI <0.03    BNP: BNP (last 3 results) Recent Labs    07/17/18 1546  BNP 2,003.9*    ProBNP (last 3 results) No results for input(s): PROBNP in the last 8760 hours.   D-Dimer No results for input(s): DDIMER in the last 72 hours. Hemoglobin A1C No results for input(s): HGBA1C in the last 72 hours. Fasting Lipid Panel No results for input(s): CHOL, HDL, LDLCALC, TRIG, CHOLHDL, LDLDIRECT in the last 72 hours. Thyroid Function Tests Recent Labs    07/17/18 1545  TSH 3.241    Other results:   Imaging  No results found.   Medications:     Scheduled Medications: . mometasone-formoterol  2 puff Inhalation BID  . sodium chloride flush  3 mL Intravenous Q12H     Infusions: . sodium chloride    . amiodarone 30 mg/hr (07/18/18 0533)  . heparin 1,200 Units/hr (07/18/18 0600)     PRN Medications:  sodium chloride, acetaminophen, albuterol, gabapentin, ondansetron (ZOFRAN) IV, sodium chloride flush, traZODone    Patient Profile   Marcus Bates is a 62 -year-old male with h/o systolic HF due to NICM, mild nonobstructive CAD, PE, LV thrombus, and PAF.   Admitted from HF clinic with SVT > Afib RVR.   Assessment/Plan   1.SVT  - Was in SVT 190s on arrival to clinic yesterday. Attempted valsava, carotid massage, and adenosine without success. Then went into Afib RVR.  2. Paroxymsal A Fib RVR -  Remains in Afib RVR. On amio drip @ 30 mg.  - Allergic to warfarin. He is on heparin drip now. Eventually transition to eliquis.  - Rebolus amio. Start milrinone for shock (see below) - Will likely need TEE/DCCV. Has not been on AC at home. Will discuss timing with MD.  3. Chronic systolic HF with severe biventricular dysfunction EF 10-15% due to NICM -> Cardiogenic shock. Newly diagnosed 8/16. Echo 09/10/15 with EF 25%. Echo 7/17 EF 25-30%. Refused ICD. - Echo 05/2017 with EF 45-50%, with Grade 1 DD.EF back down in August on this year. ECHO 04/2018 25-30%.  - Volume status stable on exam. BNP elevated, but may just be secondary to afib rvr. Will not give further lasix  - Concern for low output/shock in setting of afib RVR.   - Place PICC. Start milrinone 0.125 mcg/kg/min. Have NE at bedside if needed for BP support.  Sherryll Burger and coreg on hold with hypotension. -Stop corlanor. - Failed spiro in past. Had rash. - Repeat echo ordered.  4.. RLL pulmonary embolism  - In setting of low EF. Treated ~ 1year. Now off Eliquis.  5. Warfarin and ASA allergies  6.Mild nonobstructive CAD by cath 04/2015 - Having chest tightness. Troponin <0.03. Allergic to ASA. Plan to start eliquis soon. - He is not on a statin.   7. MR  - Repeat echo ordered 8.LV mural thrombus - LV thrombus resolved on echo 05/2017. Eliquis stopped previously. No change.   9.Frequnet PVCs.  - Continue amio 10. Anxiety - He does not want to take anything for anxiety. 11. Insomnia - Takes passion flower (OTC) at home.  Medication concerns reviewed with patient and pharmacy team. Barriers identified: none at this time  Length of Stay: 1  Alford Highland, NP  07/18/2018, 7:03 AM  Advanced Heart Failure Team Pager [phone] (M-F; 7a - 4p)  Please contact [facility] for night-coverage after hours (4p -7a ) and weekends on [email or web address]  Agree.  He is in profound shock this am with evidence of end-organ hypoperfusion  and diffuse abdominal symptoms. Remains in AF despite amio gtt. Rates 120-130s.   Exam. Uncomfortable mottled JVP to jaw Cor IRR tachy + s3 Lungs mild crackles Ab soft ND Ext mottled cool. Minimal edema  I did bedside echo and EF ~10% with moderate RV dysfunction. Will start inotropic support. Place central access to measure CVP and co-ox. Hold amio for now. (I think b-blocker hurting him). Continue heparin.   CRITICAL CARE Performed by: [name]  Total critical care time: 35 minutes  Critical care time was exclusive of separately billable procedures and treating  other patients.  Critical care was necessary to treat or prevent imminent or life-threatening deterioration.  Critical care was time spent personally by me (independent of midlevel providers or residents) on the following activities: development of treatment plan with patient and/or surrogate as well as nursing, discussions with consultants, evaluation of patient's response to treatment, examination of patient, obtaining history from patient or surrogate, ordering and performing treatments and interventions, ordering and review of laboratory studies, ordering and review of radiographic studies, pulse oximetry and re-evaluation of patient's condition.  [name], MD  9:23 AM

## 2018-07-18 NOTE — Progress Notes (Signed)
Cardiology paged [place]. Patient experiencing increasing SOB, nausea and vomiting. Patient sitting on the edge of the bed states "I just cannot catch my breath." Patients HR in the 160's, BP 99/83 (89), sating 98-99% on RA. Orders received to give one time bolus 150 mg of Amiodarone. Will continue to monitor closely.

## 2018-07-18 NOTE — Procedures (Signed)
Central Venous Catheter Insertion Procedure Note Marcus Bates [id number] 20-Nov-1955  Procedure: Insertion of Central Venous Catheter Indications: Drug and/or fluid administration  Procedure Details Consent: Risks of procedure as well as the alternatives and risks of each were explained to the (patient/caregiver).  Consent for procedure obtained. and Unable to obtain consent because of emergent medical necessity. Time Out: Verified patient identification, verified procedure, site/side was marked, verified correct patient position, special equipment/implants available, medications/allergies/relevent history reviewed, required imaging and test results available.  Performed  Maximum sterile technique was used including antiseptics, cap, gloves, gown, hand hygiene, mask and sheet. Skin prep: Chlorhexidine; local anesthetic administered I first attempted to access the R subclavian vein bun cannulated the artery 2x. Then switched to RIJ approach A antimicrobial bonded/coated triple lumen catheter was placed in the right internal jugular vein using the Seldinger technique and u/s guidance.  Evaluation Blood flow good Complications: No apparent complications Patient did tolerate procedure well. Chest X-ray ordered to verify placement.  CXR: normal.  [name] MD 07/18/2018, 9:23 AM

## 2018-07-18 NOTE — Progress Notes (Signed)
ANTICOAGULATION CONSULT NOTE - Follow-up Consult  Pharmacy Consult for heparin Indication: atrial fibrillation  Allergies  Allergen Reactions  . Spironolactone Rash  . Aspirin     REACTION: facial swelling  . Dust Mite Extract   . Other     Cats   . Peanuts [Peanut Oil]     sob  . Warfarin And Related Rash    Patient Measurements: Height: 5' 8.5" (174 cm) Weight: 154 lb 6.4 oz (70 kg) IBW/kg (Calculated) : 69.55 Heparin Dosing Weight: 73.7 kg  Vital Signs: Temp: 97.7 F (36.5 C) (11/12 1535) Temp Source: Oral (11/12 1535) BP: 89/77 (11/12 1830) Pulse Rate: 68 (11/12 1900)  Labs: Recent Labs    07/17/18 1545 07/17/18 2319 07/17/18 2327 07/18/18 0924 07/18/18 1333 07/18/18 1824  HGB 12.2*  --  12.8* 13.2  --   --   HCT 38.5*  --  40.9 41.7  --   --   PLT 202  --  217 247  --   --   HEPARINUNFRC  --  0.13*  --  <0.10*  --  0.22*  CREATININE 1.30*  --  1.35* 1.77* 1.77*  --   TROPONINI <0.03  --   --   --   --   --     Estimated Creatinine Clearance: 42.1 mL/min (A) (by C-G formula based on SCr of 1.77 mg/dL (H)).   Medical History: Past Medical History:  Diagnosis Date  . Acute deep vein thrombosis (DVT) of popliteal vein of right lower extremity (HCC)    a. 04/2015 -> Xarelto  . Anxiety   . Asthma   . Chronic combined systolic and diastolic CHF (congestive heart failure) (HCC)    a. 04/2015 Echo: EF 10-15%, sev diff HK, Gr 3 DD, sev MR, sev dil LA, dilated RV, mod dil RA.  . Depression   . Insomnia   . LV (left ventricular) mural thrombus    a. 04/2015 Echo: possible laminated echodense area on inferior wall, cannot exclude laminated thrombus.  Marland Kitchen NICM (nonischemic cardiomyopathy) (HCC)    a. 04/2015 Echo: EF 10-15%, sev diff HK.  . Non-obstructive CAD    a. 04/2015 Cath: LM nl, LAD 49m, LCX 20ost, RCA 20d.  Marland Kitchen NSVT (nonsustained ventricular tachycardia) (HCC)   . Pulmonary embolism on right (HCC)    a. 04/2015 -> Xarelto  . Restless leg syndrome   .  Severe mitral insufficiency    a. 04/2015 Echo: Sev MR.    Medications:  Scheduled:  . amiodarone  200 mg Oral BID  . iron polysaccharides  150 mg Oral Daily  . mometasone-formoterol  2 puff Inhalation BID  . sodium chloride flush  3 mL Intravenous Q12H    Assessment: 62 yom with hx of CHF (LVEF 25-30%), hx of LV thrombus in 2018 + hx PE; known allergy to warfarin (rash) and was transitioned to apixaban in 2016 - no longer on anticoagulation PTA. Found to be in SVT followed by Afib s/p adenosine. Starting on heparin infusion with plans for apixaban in future  Heparin level this evening came back subtherapeutic at 0.22, on 1200 units/hr. Hgb 13.2, plt 247. No infusion issues or s/sx of bleeding per nursing.   Goal of Therapy:  Heparin level 0.3-0.7 units/ml Monitor platelets by anticoagulation protocol: Yes   Plan:  Increase heparin infusion to 1350 units/hr Check anti-Xa level in 6 hours and daily while on heparin Continue to monitor H&H and platelets  Girard Cooter, PharmD Clinical Pharmacist  Pager:  [phone] Phone: [phone] 07/18/2018     7:32 PM

## 2018-07-19 ENCOUNTER — Other Ambulatory Visit: Payer: Self-pay

## 2018-07-19 ENCOUNTER — Encounter (HOSPITAL_COMMUNITY): Payer: Self-pay

## 2018-07-19 LAB — BASIC METABOLIC PANEL
ANION GAP: 10 (ref 5–15)
BUN: 22 mg/dL (ref 8–23)
CALCIUM: 8.2 mg/dL — AB (ref 8.9–10.3)
CO2: 21 mmol/L — AB (ref 22–32)
CREATININE: 1.71 mg/dL — AB (ref 0.61–1.24)
Chloride: 102 mmol/L (ref 98–111)
GFR calc Af Amer: 47 mL/min — ABNORMAL LOW (ref >60)
GFR, EST NON AFRICAN AMERICAN: 41 mL/min — AB (ref >60)
GLUCOSE: 147 mg/dL — AB (ref 70–99)
Potassium: 3.6 mmol/L (ref 3.5–5.1)
Sodium: 133 mmol/L — ABNORMAL LOW (ref 135–145)

## 2018-07-19 LAB — COOXEMETRY PANEL
CARBOXYHEMOGLOBIN: 1 % (ref 0.5–1.5)
CARBOXYHEMOGLOBIN: 0.9 % (ref 0.5–1.5)
METHEMOGLOBIN: 1.6 % — AB (ref 0.0–1.5)
METHEMOGLOBIN: 1 % (ref 0.0–1.5)
O2 SAT: 59.7 %
O2 Saturation: 43.5 %
TOTAL HEMOGLOBIN: 13.7 g/dL (ref 12.0–16.0)
Total hemoglobin: 13.1 g/dL (ref 12.0–16.0)

## 2018-07-19 LAB — CBC
HCT: 40.6 % (ref 39.0–52.0)
Hemoglobin: 13.4 g/dL (ref 13.0–17.0)
MCH: 29.8 pg (ref 26.0–34.0)
MCHC: 33 g/dL (ref 30.0–36.0)
MCV: 90.2 fL (ref 80.0–100.0)
NRBC: 0 % (ref 0.0–0.2)
PLATELETS: 235 10*3/uL (ref 150–400)
RBC: 4.5 MIL/uL (ref 4.22–5.81)
RDW: 13.7 % (ref 11.5–15.5)
WBC: 10.3 10*3/uL (ref 4.0–10.5)

## 2018-07-19 LAB — HEPARIN LEVEL (UNFRACTIONATED)
HEPARIN UNFRACTIONATED: 0.12 [IU]/mL — AB (ref 0.30–0.70)
Heparin Unfractionated: 0.28 IU/mL — ABNORMAL LOW (ref 0.30–0.70)
Heparin Unfractionated: 0.34 IU/mL (ref 0.30–0.70)

## 2018-07-19 LAB — MAGNESIUM: Magnesium: 1.9 mg/dL (ref 1.7–2.4)

## 2018-07-19 MED ORDER — MAGNESIUM HYDROXIDE 400 MG/5ML PO SUSP
30.0000 mL | Freq: Every evening | ORAL | Status: DC | PRN
  Administered 2018-07-19 – 2018-07-24 (×2): 30 mL via ORAL
  Filled 2018-07-19 (×2): qty 30

## 2018-07-19 MED ORDER — MAGNESIUM SULFATE 2 GM/50ML IV SOLN
2.0000 g | Freq: Once | INTRAVENOUS | Status: AC
  Administered 2018-07-19: 2 g via INTRAVENOUS
  Filled 2018-07-19: qty 50

## 2018-07-19 MED ORDER — POTASSIUM CHLORIDE CRYS ER 20 MEQ PO TBCR
20.0000 meq | EXTENDED_RELEASE_TABLET | Freq: Once | ORAL | Status: AC
  Administered 2018-07-19: 20 meq via ORAL
  Filled 2018-07-19: qty 1

## 2018-07-19 MED ORDER — AMIODARONE HCL IN DEXTROSE 360-4.14 MG/200ML-% IV SOLN
30.0000 mg/h | INTRAVENOUS | Status: DC
  Administered 2018-07-20 – 2018-07-23 (×6): 30 mg/h via INTRAVENOUS
  Filled 2018-07-19 (×10): qty 200

## 2018-07-19 MED ORDER — MILRINONE LACTATE IN DEXTROSE 20-5 MG/100ML-% IV SOLN
0.1250 ug/kg/min | INTRAVENOUS | Status: DC
  Administered 2018-07-20 – 2018-07-22 (×4): 0.375 ug/kg/min via INTRAVENOUS
  Administered 2018-07-23: 0.25 ug/kg/min via INTRAVENOUS
  Administered 2018-07-24: 0.125 ug/kg/min via INTRAVENOUS
  Filled 2018-07-19 (×8): qty 100

## 2018-07-19 MED ORDER — MILRINONE LACTATE IN DEXTROSE 20-5 MG/100ML-% IV SOLN: 0.2500 ug/kg/min | INTRAVENOUS | Status: DC

## 2018-07-19 MED ORDER — AMIODARONE HCL IN DEXTROSE 360-4.14 MG/200ML-% IV SOLN
INTRAVENOUS | Status: AC
  Administered 2018-07-19: 60 mg/h via INTRAVENOUS
  Filled 2018-07-19: qty 200

## 2018-07-19 MED ORDER — DIGOXIN 125 MCG PO TABS
0.1250 mg | ORAL_TABLET | Freq: Every day | ORAL | Status: DC
  Administered 2018-07-19 – 2018-07-26 (×8): 0.125 mg via ORAL
  Filled 2018-07-19 (×8): qty 1

## 2018-07-19 MED ORDER — ADENOSINE 6 MG/2ML IV SOLN
INTRAVENOUS | Status: AC
  Administered 2018-07-19: 3 mg via INTRAVENOUS
  Filled 2018-07-19: qty 2

## 2018-07-19 MED ORDER — ADENOSINE 6 MG/2ML IV SOLN
3.0000 mg | Freq: Once | INTRAVENOUS | Status: AC
  Administered 2018-07-19: 3 mg via INTRAVENOUS

## 2018-07-19 MED ORDER — AMIODARONE LOAD VIA INFUSION
150.0000 mg | Freq: Once | INTRAVENOUS | Status: AC
  Administered 2018-07-19: 150 mg via INTRAVENOUS
  Filled 2018-07-19: qty 83.34

## 2018-07-19 MED ORDER — POLYSACCHARIDE IRON COMPLEX 150 MG PO CAPS
150.0000 mg | ORAL_CAPSULE | Freq: Every day | ORAL | Status: DC
  Administered 2018-07-19: 150 mg via ORAL
  Filled 2018-07-19: qty 1

## 2018-07-19 MED ORDER — AMIODARONE HCL IN DEXTROSE 360-4.14 MG/200ML-% IV SOLN
60.0000 mg/h | INTRAVENOUS | Status: AC
  Administered 2018-07-19 (×2): 60 mg/h via INTRAVENOUS
  Filled 2018-07-19: qty 200

## 2018-07-19 NOTE — Progress Notes (Signed)
Cardiology [place] paged a second time for patients HR in the 190's, narrow complex tachycardia. Charna Busman arrived on unit and in patients room. Patient alert and calm. Valsalva maneuvers attempted by the provider intermittently successful. Patient continues to go in and out of narrow complex tachycardia. Orders received to give a second 150 mg bolus of amiodarone over 30 minutes. To decrease milrinone infusion to .25 and to wean levophed for a MAP goal of 70. Stimulation decreased and lights dimmed. Will continue to closely assess and monitor.

## 2018-07-19 NOTE — Progress Notes (Signed)
ANTICOAGULATION CONSULT NOTE - Follow-up Consult  Pharmacy Consult for heparin Indication: atrial fibrillation  Allergies  Allergen Reactions  . Spironolactone Rash  . Aspirin     REACTION: facial swelling  . Dust Mite Extract   . Other     Cats   . Peanuts [Peanut Oil]     sob  . Warfarin And Related Rash    Patient Measurements: Height: 5' 8.5" (174 cm) Weight: 161 lb 6 oz (73.2 kg) IBW/kg (Calculated) : 69.55 Heparin Dosing Weight: 73.2 kg  Vital Signs: Temp: 98 F (36.7 C) (11/13 1121) Temp Source: Oral (11/13 1121) BP: 93/79 (11/13 1230) Pulse Rate: 94 (11/13 1230)  Labs: Recent Labs    07/17/18 1545  07/17/18 2327 07/18/18 0924 07/18/18 1333 07/18/18 1824 07/19/18 0440 07/19/18 1414  HGB 12.2*  --  12.8* 13.2  --   --  13.4  --   HCT 38.5*  --  40.9 41.7  --   --  40.6  --   PLT 202  --  217 247  --   --  235  --   HEPARINUNFRC  --    < >  --  <0.10*  --  0.22* 0.28* 0.34  CREATININE 1.30*  --  1.35* 1.77* 1.77*  --  1.71*  --   TROPONINI <0.03  --   --   --   --   --   --   --    < > = values in this interval not displayed.    Estimated Creatinine Clearance: 43.5 mL/min (A) (by C-G formula based on SCr of 1.71 mg/dL (H)).   Medical History: Past Medical History:  Diagnosis Date  . Acute deep vein thrombosis (DVT) of popliteal vein of right lower extremity (HCC)    a. 04/2015 -> Xarelto  . Anxiety   . Asthma   . Chronic combined systolic and diastolic CHF (congestive heart failure) (HCC)    a. 04/2015 Echo: EF 10-15%, sev diff HK, Gr 3 DD, sev MR, sev dil LA, dilated RV, mod dil RA.  . Depression   . Insomnia   . LV (left ventricular) mural thrombus    a. 04/2015 Echo: possible laminated echodense area on inferior wall, cannot exclude laminated thrombus.  Marland Kitchen NICM (nonischemic cardiomyopathy) (HCC)    a. 04/2015 Echo: EF 10-15%, sev diff HK.  . Non-obstructive CAD    a. 04/2015 Cath: LM nl, LAD 73m, LCX 20ost, RCA 20d.  Marland Kitchen NSVT (nonsustained  ventricular tachycardia) (HCC)   . Pulmonary embolism on right (HCC)    a. 04/2015 -> Xarelto  . Restless leg syndrome   . Severe mitral insufficiency    a. 04/2015 Echo: Sev MR.    Medications:  Scheduled:  . digoxin  0.125 mg Oral Daily  . iron polysaccharides  150 mg Oral Q2200  . mometasone-formoterol  2 puff Inhalation BID  . sodium chloride flush  3 mL Intravenous Q12H    Assessment: 62 yom with hx of CHF (LVEF 25-30%), hx of LV thrombus in 2018 + hx PE; known allergy to warfarin (rash) and was transitioned to apixaban in 2016 - no longer on anticoagulation PTA. Found to be in SVT followed by Afib s/p adenosine. Starting on heparin infusion with plans for apixaban in future  Afternoon heparin level is therapeutic at 0.34 on 1450 units/hr of heparin. No infusion issues or s/sx of bleeding per nursing.   Goal of Therapy:  Heparin level 0.3-0.7 units/ml Monitor platelets by anticoagulation  protocol: Yes   Plan:  Continue heparin infusion at 1450 units/hr Check confirmatory anti-Xa level in 6 hours and daily while on heparin Continue to monitor H&H and platelets  Arvilla Market, PharmD PGY1 Pharmacy Resident Phone [phone] 07/19/2018     2:57 PM

## 2018-07-19 NOTE — Progress Notes (Addendum)
Advanced Heart Failure Rounding Note  PCP-Cardiologist: No primary care provider on file.   Subjective:    Coox 59%. Remains on NE @ 9 and milrinone @ 0.25 (decreased after narrow complex tachycardia this am). SBP 90-100s. CVP 11-12  He had 3 runs of SVT vs Afib RVR 180-190s. Received 3 mg adenosine and 2 bolus of amio. Amio drip restarted this morning @ 60 mg/hr. Now in NSR with frequent PVCs.  Received 80 mg IV lasix x 1 with sluggish UOP. I/O +170 mls. Creatinine 1.77 > 1.71. K 3.6   He feels okay this morning. SOB has improved. No longer having chest tightness. Rare nausea. Has palpitations when HR is fast.  Echo 07/18/18: EF 10-15%, mild MR, LA and RA mod dilated, RV moderately reduced  Objective:   Weight Range: 73.2 kg Body mass index is 24.18 kg/m.   Vital Signs:   Temp:  [97.7 F (36.5 C)-98.2 F (36.8 C)] 98.1 F (36.7 C) (11/13 0400) Pulse Rate:  [63-200] 86 (11/13 0645) Resp:  [10-35] 16 (11/13 0645) BP: (79-126)/(54-99) 98/77 (11/13 0645) SpO2:  [91 %-100 %] 97 % (11/13 0645) Weight:  [73.2 kg] 73.2 kg (11/13 0500) Last BM Date: 07/16/18  Weight change: Filed Weights   07/17/18 1650 07/18/18 0500 07/19/18 0500  Weight: 66.8 kg 70 kg 73.2 kg    Intake/Output:   Intake/Output Summary (Last 24 hours) at 07/19/2018 0706 Last data filed at 07/19/2018 0600 Gross per 24 hour  Intake 1193.82 ml  Output 1025 ml  Net 168.82 ml      Physical Exam    General: No resp difficulty. HEENT: Normal Neck: Supple. JVP 11-12. Carotids 2+ bilat; no bruits. No thyromegaly or nodule noted. Cor: PMI nondisplaced. Tachy, frequent ectopy, +s3 Lungs: CTAB, normal effort. Abdomen: Soft, non-tender, non-distended, no HSM. No bruits or masses. +BS  Extremities: No cyanosis, clubbing, or rash. R and LLE no edema.  Neuro: Alert & orientedx3, cranial nerves grossly intact. moves all 4 extremities w/o difficulty. Affect pleasant   Telemetry   Currently NSR/sinus  tach 90-100s with frequent PVCs. Overnight had 3 runs of SVT vs afib RVR 180-190s, lasting 10-30 minutes each. Personally reviewed.   EKG    EKG 11/11: Afib RVR 151.  Labs    CBC Recent Labs    07/18/18 0924 07/19/18 0440  WBC 11.7* 10.3  HGB 13.2 13.4  HCT 41.7 40.6  MCV 92.3 90.2  PLT 247 235   Basic Metabolic Panel Recent Labs    95/09/32 2125  07/18/18 0924 07/18/18 1333 07/19/18 0440  NA  --    < > 133* 134* 133*  K  --    < > 5.8* 4.7 3.6  CL  --    < > 100 102 102  CO2  --    < > 19* 20* 21*  GLUCOSE  --    < > 169* 163* 147*  BUN  --    < > 21 23 22   CREATININE  --    < > 1.77* 1.77* 1.71*  CALCIUM  --    < > 8.4* 8.2* 8.2*  MG 2.0  --  1.9  --   --    < > = values in this interval not displayed.   Liver Function Tests Recent Labs    07/17/18 1545 07/18/18 1333  AST 136* 523*  ALT 151* 590*  ALKPHOS 49 53  BILITOT 0.7 0.6  PROT 5.9* 6.3*  ALBUMIN 3.5 3.5  No results for input(s): LIPASE, AMYLASE in the last 72 hours. Cardiac Enzymes Recent Labs    07/17/18 1545  TROPONINI <0.03    BNP: BNP (last 3 results) Recent Labs    07/17/18 1546  BNP 2,003.9*    ProBNP (last 3 results) No results for input(s): PROBNP in the last 8760 hours.   D-Dimer No results for input(s): DDIMER in the last 72 hours. Hemoglobin A1C No results for input(s): HGBA1C in the last 72 hours. Fasting Lipid Panel No results for input(s): CHOL, HDL, LDLCALC, TRIG, CHOLHDL, LDLDIRECT in the last 72 hours. Thyroid Function Tests Recent Labs    07/17/18 1545  TSH 3.241    Other results:   Imaging    Dg Chest Port 1 View  Result Date: 07/18/2018 CLINICAL DATA:  Status post central line placement EXAM: PORTABLE CHEST 1 VIEW COMPARISON:  07/18/2018 FINDINGS: Cardiac shadow remains enlarged. Right jugular central line is noted with the tip at the cavoatrial junction. No pneumothorax is seen. No focal infiltrate or sizable effusion is noted. No bony  abnormality is seen. IMPRESSION: No pneumothorax following central line placement. Electronically Signed   By: Alcide Clever M.D.   On: 07/18/2018 09:25   Dg Chest Port 1 View  Result Date: 07/18/2018 CLINICAL DATA:  Shortness of breath EXAM: PORTABLE CHEST 1 VIEW COMPARISON:  05/01/2015 FINDINGS: Cardiac shadow remains enlarged in size. The lungs are well aerated bilaterally. No focal infiltrate, effusion or pneumothorax is seen. No bony abnormality is noted. IMPRESSION: No active disease. Electronically Signed   By: Alcide Clever M.D.   On: 07/18/2018 08:52   [place] Ekg Site Rite  Result Date: 07/18/2018 If Site Rite image not attached, placement could not be confirmed due to current cardiac rhythm.    Medications:     Scheduled Medications: . amiodarone  200 mg Oral BID  . iron polysaccharides  150 mg Oral Daily  . mometasone-formoterol  2 puff Inhalation BID  . sodium chloride flush  3 mL Intravenous Q12H    Infusions: . sodium chloride    . amiodarone 60 mg/hr (07/19/18 0600)   Followed by  . amiodarone    . heparin 1,350 Units/hr (07/19/18 0600)  . milrinone 0.25 mcg/kg/min (07/19/18 0653)  . norepinephrine (LEVOPHED) Adult infusion 9 mcg/min (07/19/18 0600)    PRN Medications: sodium chloride, acetaminophen, albuterol, gabapentin, ondansetron (ZOFRAN) IV, sodium chloride flush, traZODone    Patient Profile   Marcus Bates is a 46 -year-old male with h/o systolic HF due to NICM, mild nonobstructive CAD, PE, LV thrombus, and PAF.   Admitted from HF clinic with SVT > Afib RVR.   Assessment/Plan   1.SVT  - Was in SVT 190s on arrival to clinic yesterday. Attempted valsava, carotid massage, and adenosine without success. Then went into Afib RVR.  - SVT vs afib RVR overnight x3. Did not respond to 3 mg adenosine. See below. 2. Paroxymsal A Fib RVR - Amio drip restarted @ 60 mg/hr this morning. Received bolus x 2 - Allergic to warfarin. He is on heparin drip now. Eventually  transition to eliquis. .  3. Chronic systolic HF with severe biventricular dysfunction EF 10-15% due to NICM -> Cardiogenic shock. Newly diagnosed 8/16. Echo 09/10/15 with EF 25%. Echo 7/17 EF 25-30%. Refused ICD. - Echo 05/2017 with EF 45-50%, with Grade 1 DD.EF back down in August on this year. ECHO 04/2018 25-30%.  - Echo 07/18/18: EF 10-15%, mild MR, LA and RA mod dilated, RV moderately reduced -  Volume status mildly up. CVP 11-12.  - Remains on NE @ 9, mirlinone @ 0.25 (decreased from 0.375 with tachycardia this am). Coox 59% - Entresto and coreg on hold with hypotension. -Off corlanor with afib. - Failed spiro in past. Had rash. 4.. RLL pulmonary embolism  - In setting of low EF. Treated ~ 1year. Now off Eliquis.  5. Warfarin and ASA allergies  6.Mild nonobstructive CAD by cath 04/2015 - Having chest tightness. Troponin <0.03. Allergic to ASA. Plan to start eliquis soon. - He is not on a statin.  No s/s ischemia.  7. MR  - Only mild on echo yesterday. 8.LV mural thrombus - LV thrombus resolved on echo 05/2017. Eliquis stopped previously. No thrombus on echo yesterday.  9. Frequnet PVCs.  - Continue amio for now. - K 3.6. Supp cautiously. Check mag. 10. Anxiety - Improved thi sam. 11. Insomnia - Takes passion flower (OTC) at home. Requested iron be dose at night.  Medication concerns reviewed with patient and pharmacy team. Barriers identified: none at this time  Length of Stay: 2  Alford Highland, NP  07/19/2018, 7:06 AM  Advanced Heart Failure Team Pager [phone] (M-F; 7a - 4p)  Please contact [facility] for night-coverage after hours (4p -7a ) and weekends on [email or web address]  Agree with above  More SVT overnight. Given adenosine and converted. Placed back on IV amiodarone. Milrinone and NE cut back. In NSR with frequent PACs this am. Now tolerating IV amio without evidence of end-organ hypoperfusion. Has some appetite this am. On heparin without bleeding. Co-ox  remains low.   Lying in bed JVP 10-12 RIJ TLC Cor RRR + ectopy + s3 Lungs clear Ab soft NT Ext pale cool. Minimal edema  Remains very tenuous with cardiogenic shock in setting of recurrent AF with RVR. Has evidence of shock liver/kidney. Suspect cardiomyopathy due in part to AF but EF was starting to drop again prior to AF so not sure that is entire picture. Now in NSR and tolerating amio. With low co-ox will increase milrinone carefully. Supp electrolytes. Keep in ICU. If EF improves will need to consider possible ablation.   CRITICAL CARE Performed by: [name]  Total critical care time: 45 minutes  Critical care time was exclusive of separately billable procedures and treating other patients.  Critical care was necessary to treat or prevent imminent or life-threatening deterioration.  Critical care was time spent personally by me (independent of midlevel providers or residents) on the following activities: development of treatment plan with patient and/or surrogate as well as nursing, discussions with consultants, evaluation of patient's response to treatment, examination of patient, obtaining history from patient or surrogate, ordering and performing treatments and interventions, ordering and review of laboratory studies, ordering and review of radiographic studies, pulse oximetry and re-evaluation of patient's condition.  [name], MD  1:23 PM

## 2018-07-19 NOTE — Progress Notes (Addendum)
Cardiology, Charna Busman paged. Patient HR 190's-200's, BP 102/89. Patient alert and talking. EKG performed and indicated narrow complex tachycardia. Valsalva maneuvers were attempted with patients full cooperation with no success. Orders received, 3mg  of adenosine given IV push. Patients HR returned to the 60's NSR. Orders received to bolus with 150mg  of amiodarone and begin the drip at 60mg /hr. AM labs and CoOx drawn. Will continue to closely monitor.

## 2018-07-19 NOTE — Progress Notes (Signed)
ANTICOAGULATION CONSULT NOTE - Follow-up Consult  Pharmacy Consult for heparin Indication: atrial fibrillation  Allergies  Allergen Reactions  . Spironolactone Rash  . Aspirin     REACTION: facial swelling  . Dust Mite Extract   . Other     Cats   . Peanuts [Peanut Oil]     sob  . Warfarin And Related Rash    Patient Measurements: Height: 5' 8.5" (174 cm) Weight: 161 lb 6 oz (73.2 kg) IBW/kg (Calculated) : 69.55 Heparin Dosing Weight: 73.2 kg  Vital Signs: Temp: 98.8 F (37.1 C) (11/13 2000) Temp Source: Oral (11/13 2000) BP: 108/84 (11/13 2000) Pulse Rate: 98 (11/13 2000)  Labs: Recent Labs    07/17/18 1545  07/17/18 2327 07/18/18 0924 07/18/18 1333  07/19/18 0440 07/19/18 1414 07/19/18 2052  HGB 12.2*  --  12.8* 13.2  --   --  13.4  --   --   HCT 38.5*  --  40.9 41.7  --   --  40.6  --   --   PLT 202  --  217 247  --   --  235  --   --   HEPARINUNFRC  --    < >  --  <0.10*  --    < > 0.28* 0.34 0.12*  CREATININE 1.30*  --  1.35* 1.77* 1.77*  --  1.71*  --   --   TROPONINI <0.03  --   --   --   --   --   --   --   --    < > = values in this interval not displayed.    Estimated Creatinine Clearance: 43.5 mL/min (A) (by C-G formula based on SCr of 1.71 mg/dL (H)).   Medical History: Past Medical History:  Diagnosis Date  . Acute deep vein thrombosis (DVT) of popliteal vein of right lower extremity (HCC)    a. 04/2015 -> Xarelto  . Anxiety   . Asthma   . Chronic combined systolic and diastolic CHF (congestive heart failure) (HCC)    a. 04/2015 Echo: EF 10-15%, sev diff HK, Gr 3 DD, sev MR, sev dil LA, dilated RV, mod dil RA.  . Depression   . Insomnia   . LV (left ventricular) mural thrombus    a. 04/2015 Echo: possible laminated echodense area on inferior wall, cannot exclude laminated thrombus.  Marland Kitchen NICM (nonischemic cardiomyopathy) (HCC)    a. 04/2015 Echo: EF 10-15%, sev diff HK.  . Non-obstructive CAD    a. 04/2015 Cath: LM nl, LAD 43m, LCX 20ost, RCA  20d.  Marland Kitchen NSVT (nonsustained ventricular tachycardia) (HCC)   . Pulmonary embolism on right (HCC)    a. 04/2015 -> Xarelto  . Restless leg syndrome   . Severe mitral insufficiency    a. 04/2015 Echo: Sev MR.    Medications:  Scheduled:  . digoxin  0.125 mg Oral Daily  . iron polysaccharides  150 mg Oral Q2200  . mometasone-formoterol  2 puff Inhalation BID  . sodium chloride flush  3 mL Intravenous Q12H    Assessment: 62 yom with hx of CHF (LVEF 25-30%), hx of LV thrombus in 2018 + hx PE; known allergy to warfarin (rash) and was transitioned to apixaban in 2016 - no longer on anticoagulation PTA. Found to be in SVT followed by Afib s/p adenosine. Starting on heparin infusion with plans for apixaban in future  Heparin level this evening came back subtherapeutic at 0.12, on 1450 units/hr. Hgb 13.4, plt  235. No infusion issues or s/sx of bleeding per nursing.   Goal of Therapy:  Heparin level 0.3-0.7 units/ml Monitor platelets by anticoagulation protocol: Yes   Plan:  Increase heparin infusion to 1550 units/hr Check confirmatory anti-Xa level in 6 hours and daily while on heparin Continue to monitor H&H and platelets  Girard Cooter, PharmD Clinical Pharmacist  Pager: [phone] Phone: [phone] 07/19/2018     9:14 PM

## 2018-07-19 NOTE — Progress Notes (Signed)
I received a [place] to provide spiritual support for the patient. Prior to visiting with the patient, I spoke with his nurse for guidance on how I could best support the patient. After our conversation, I visited the patient and had a brief conversation. The patient was very tired and preferred another visit at another time. I provided spiritual support by leading in a brief prayer. I shared that a Chaplain could visit and provide additional support at a later time as needed or requested.    07/19/18 1500  Clinical Encounter Type  Visited With Patient;Health care provider  Visit Type Follow-up;Spiritual support  Referral From Nurse  Consult/Referral To Chaplain  Spiritual Encounters  Spiritual Needs Prayer;Emotional  Stress Factors  Patient Stress Factors Exhausted    Chaplain Dr Melvyn Novas

## 2018-07-19 NOTE — Progress Notes (Signed)
ANTICOAGULATION CONSULT NOTE - Follow-up Consult  Pharmacy Consult for heparin Indication: atrial fibrillation  Allergies  Allergen Reactions  . Spironolactone Rash  . Aspirin     REACTION: facial swelling  . Dust Mite Extract   . Other     Cats   . Peanuts [Peanut Oil]     sob  . Warfarin And Related Rash    Patient Measurements: Height: 5' 8.5" (174 cm) Weight: 161 lb 6 oz (73.2 kg) IBW/kg (Calculated) : 69.55 Heparin Dosing Weight: 73.2 kg  Vital Signs: Temp: 98.1 F (36.7 C) (11/13 0400) Temp Source: Oral (11/13 0400) BP: 104/74 (11/13 0715) Pulse Rate: 101 (11/13 0715)  Labs: Recent Labs    07/17/18 1545  07/17/18 2327 07/18/18 0924 07/18/18 1333 07/18/18 1824 07/19/18 0440  HGB 12.2*  --  12.8* 13.2  --   --  13.4  HCT 38.5*  --  40.9 41.7  --   --  40.6  PLT 202  --  217 247  --   --  235  HEPARINUNFRC  --    < >  --  <0.10*  --  0.22* 0.28*  CREATININE 1.30*  --  1.35* 1.77* 1.77*  --  1.71*  TROPONINI <0.03  --   --   --   --   --   --    < > = values in this interval not displayed.    Estimated Creatinine Clearance: 43.5 mL/min (A) (by C-G formula based on SCr of 1.71 mg/dL (H)).   Medical History: Past Medical History:  Diagnosis Date  . Acute deep vein thrombosis (DVT) of popliteal vein of right lower extremity (HCC)    a. 04/2015 -> Xarelto  . Anxiety   . Asthma   . Chronic combined systolic and diastolic CHF (congestive heart failure) (HCC)    a. 04/2015 Echo: EF 10-15%, sev diff HK, Gr 3 DD, sev MR, sev dil LA, dilated RV, mod dil RA.  . Depression   . Insomnia   . LV (left ventricular) mural thrombus    a. 04/2015 Echo: possible laminated echodense area on inferior wall, cannot exclude laminated thrombus.  Marland Kitchen NICM (nonischemic cardiomyopathy) (HCC)    a. 04/2015 Echo: EF 10-15%, sev diff HK.  . Non-obstructive CAD    a. 04/2015 Cath: LM nl, LAD 65m, LCX 20ost, RCA 20d.  Marland Kitchen NSVT (nonsustained ventricular tachycardia) (HCC)   . Pulmonary  embolism on right (HCC)    a. 04/2015 -> Xarelto  . Restless leg syndrome   . Severe mitral insufficiency    a. 04/2015 Echo: Sev MR.    Medications:  Scheduled:  . iron polysaccharides  150 mg Oral Q2200  . mometasone-formoterol  2 puff Inhalation BID  . sodium chloride flush  3 mL Intravenous Q12H    Assessment: 62 yom with hx of CHF (LVEF 25-30%), hx of LV thrombus in 2018 + hx PE; known allergy to warfarin (rash) and was transitioned to apixaban in 2016 - no longer on anticoagulation PTA. Found to be in SVT followed by Afib s/p adenosine. Starting on heparin infusion with plans for apixaban in future  Heparin level this morning came back slightly subtherapeutic at 0.28, on 1350 units/hr. CBC stable. No infusion issues or s/sx of bleeding per nursing.   Goal of Therapy:  Heparin level 0.3-0.7 units/ml Monitor platelets by anticoagulation protocol: Yes   Plan:  Increase heparin infusion to 1450 units/hr Check anti-Xa level in 6 hours and daily while on heparin  Continue to monitor H&H and platelets  Arvilla Market, PharmD PGY1 Pharmacy Resident Phone [phone] 07/19/2018     7:25 AM

## 2018-07-19 NOTE — Plan of Care (Signed)
  Problem: Education: Goal: Knowledge of General Education information will improve Description Including pain rating scale, medication(s)/side effects and non-pharmacologic comfort measures Outcome: Progressing   Problem: Education: Goal: Knowledge of disease or condition will improve Outcome: Progressing   Problem: Activity: Goal: Ability to tolerate increased activity will improve Outcome: Progressing

## 2018-07-19 NOTE — Significant Event (Signed)
Called to patient's bedside for Hrs in 180s-200s.   Arrived to patient alert, mentating, in no distress. BP 100s/80s.  Telemetry and initial 12 lead EKG demonstrated narrow complex regular tachycardia, likely SVT.   Attempted modified vagal maneuvers without success.  Gave 3mg  adenosine via RIJ central line.  Patient converted to SR with frequent PVCs/PACs. Conversion captured on rhythm strip.   Milrinone decreased to 0.25 and instructed RN to attempt to wean the levophed for a MAP goal of 70 to decrease catecholaminergic stimulation. Amiodarone IV rebolused and drip restarted at 1.   Patient went into SVT multiple additional times, for short periods. One of which broke with vagal maneuver, the others of which were self limited.   AM labs sent and pending.   Consider EP consult for SVT ablation in the AM.   Cynthea Zachman K. Charna Busman, MD

## 2018-07-20 LAB — BASIC METABOLIC PANEL
ANION GAP: 8 (ref 5–15)
BUN: 15 mg/dL (ref 8–23)
CALCIUM: 7.4 mg/dL — AB (ref 8.9–10.3)
CO2: 21 mmol/L — ABNORMAL LOW (ref 22–32)
Chloride: 103 mmol/L (ref 98–111)
Creatinine, Ser: 1.34 mg/dL — ABNORMAL HIGH (ref 0.61–1.24)
GFR, EST NON AFRICAN AMERICAN: 55 mL/min — AB (ref >60)
Glucose, Bld: 132 mg/dL — ABNORMAL HIGH (ref 70–99)
POTASSIUM: 3.9 mmol/L (ref 3.5–5.1)
SODIUM: 132 mmol/L — AB (ref 135–145)

## 2018-07-20 LAB — FERRITIN: Ferritin: 696 ng/mL — ABNORMAL HIGH (ref 24–336)

## 2018-07-20 LAB — IRON AND TIBC
Iron: 23 ug/dL — ABNORMAL LOW (ref 45–182)
SATURATION RATIOS: 11 % — AB (ref 17.9–39.5)
TIBC: 209 ug/dL — ABNORMAL LOW (ref 250–450)
UIBC: 186 ug/dL

## 2018-07-20 LAB — CBC
HCT: 33 % — ABNORMAL LOW (ref 39.0–52.0)
HEMATOCRIT: 41 % (ref 39.0–52.0)
HEMOGLOBIN: 13.3 g/dL (ref 13.0–17.0)
Hemoglobin: 10.4 g/dL — ABNORMAL LOW (ref 13.0–17.0)
MCH: 28.9 pg (ref 26.0–34.0)
MCH: 29.6 pg (ref 26.0–34.0)
MCHC: 31.5 g/dL (ref 30.0–36.0)
MCHC: 32.4 g/dL (ref 30.0–36.0)
MCV: 91.7 fL (ref 80.0–100.0)
MCV: 91.1 fL (ref 80.0–100.0)
NRBC: 0 % (ref 0.0–0.2)
PLATELETS: 189 10*3/uL (ref 150–400)
Platelets: 205 10*3/uL (ref 150–400)
RBC: 3.6 MIL/uL — AB (ref 4.22–5.81)
RBC: 4.5 MIL/uL (ref 4.22–5.81)
RDW: 13.9 % (ref 11.5–15.5)
RDW: 14.2 % (ref 11.5–15.5)
WBC: 8.4 10*3/uL (ref 4.0–10.5)
WBC: 10.1 10*3/uL (ref 4.0–10.5)
nRBC: 0 % (ref 0.0–0.2)

## 2018-07-20 LAB — COOXEMETRY PANEL
Carboxyhemoglobin: 1 % (ref 0.5–1.5)
METHEMOGLOBIN: 1.7 % — AB (ref 0.0–1.5)
O2 SAT: 61.8 %
Total hemoglobin: 12.6 g/dL (ref 12.0–16.0)

## 2018-07-20 LAB — HEPARIN LEVEL (UNFRACTIONATED)
HEPARIN UNFRACTIONATED: 0.12 [IU]/mL — AB (ref 0.30–0.70)
HEPARIN UNFRACTIONATED: 0.51 [IU]/mL (ref 0.30–0.70)
Heparin Unfractionated: 0.4 IU/mL (ref 0.30–0.70)

## 2018-07-20 LAB — MAGNESIUM: Magnesium: 2.2 mg/dL (ref 1.7–2.4)

## 2018-07-20 MED ORDER — POLYSACCHARIDE IRON COMPLEX 150 MG PO CAPS
150.0000 mg | ORAL_CAPSULE | Freq: Two times a day (BID) | ORAL | Status: DC
  Administered 2018-07-20 – 2018-07-26 (×13): 150 mg via ORAL
  Filled 2018-07-20 (×14): qty 1

## 2018-07-20 MED ORDER — FUROSEMIDE 10 MG/ML IJ SOLN
80.0000 mg | Freq: Once | INTRAMUSCULAR | Status: AC
  Administered 2018-07-20: 80 mg via INTRAVENOUS
  Filled 2018-07-20: qty 8

## 2018-07-20 MED ORDER — SORBITOL 70 % SOLN
30.0000 mL | Freq: Once | Status: AC
  Administered 2018-07-20: 30 mL via ORAL
  Filled 2018-07-20: qty 30

## 2018-07-20 MED ORDER — SODIUM CHLORIDE 0.9 % IV SOLN
510.0000 mg | INTRAVENOUS | Status: DC
  Administered 2018-07-20: 510 mg via INTRAVENOUS
  Filled 2018-07-20: qty 17

## 2018-07-20 MED ORDER — SENNA 8.6 MG PO TABS
1.0000 | ORAL_TABLET | Freq: Every day | ORAL | Status: DC | PRN
  Administered 2018-07-24 – 2018-07-26 (×3): 8.6 mg via ORAL
  Filled 2018-07-20 (×3): qty 1

## 2018-07-20 MED ORDER — DOCUSATE SODIUM 100 MG PO CAPS
100.0000 mg | ORAL_CAPSULE | Freq: Two times a day (BID) | ORAL | Status: DC | PRN
  Administered 2018-07-24 – 2018-07-26 (×3): 100 mg via ORAL
  Filled 2018-07-20 (×3): qty 1

## 2018-07-20 MED ORDER — POLYSACCHARIDE IRON COMPLEX 150 MG PO CAPS: 300.0000 mg | ORAL_CAPSULE | Freq: Every day | ORAL | Status: DC

## 2018-07-20 NOTE — Progress Notes (Addendum)
Advanced Heart Failure Rounding Note  PCP-Cardiologist: No primary care provider on file.   Subjective:    Coox 62%. Milrinone increased yesterday back to 0.375 with coox 43%. Remains on NE @ 10. SBP 88-100. CVP 11-12. No lasix yesterday. Creatinine improving 1.34 today.   Hmg 13.4 > 10.4. Remains on heparin drip. Denies bleeding.  Remains in NSR/sinus tach on amio drip 30 mg/hr. Frequent runs of NSVT, longest 16 beats. Rare runs of SVT. K 3.9, mag pending  Says he felt rough after lunch yesterday and overnight. Feels okay this morning. Has constant mid sternal chest tightness. SOB if he tries to move around too much, but does okay getting up to urinate. No dizziness. No BM in 6 days.   Echo 07/18/18: EF 10-15%, mild MR, LA and RA mod dilated, RV moderately reduced  Objective:   Weight Range: 68.4 kg Body mass index is 22.59 kg/m.   Vital Signs:   Temp:  [97.7 F (36.5 C)-99.2 F (37.3 C)] 97.7 F (36.5 C) (11/14 0400) Pulse Rate:  [72-107] 88 (11/14 0600) Resp:  [8-22] 17 (11/14 0600) BP: (88-115)/(65-92) 88/65 (11/14 0600) SpO2:  [91 %-98 %] 96 % (11/14 0600) Weight:  [68.4 kg] 68.4 kg (11/14 0500) Last BM Date: 07/16/18  Weight change: Filed Weights   07/18/18 0500 07/19/18 0500 07/20/18 0500  Weight: 70 kg 73.2 kg 68.4 kg    Intake/Output:   Intake/Output Summary (Last 24 hours) at 07/20/2018 0706 Last data filed at 07/20/2018 0600 Gross per 24 hour  Intake 2194.18 ml  Output 800 ml  Net 1394.18 ml      Physical Exam    General: No resp difficulty. HEENT: Normal Neck: Supple. JVP 11-12. Carotids 2+ bilat; no bruits. No thyromegaly or nodule noted. Cor: PMI nondisplaced. regular, frequent ectopy, +s3  Lungs: fine crackles in bases.  Abdomen: Soft, non-tender, non-distended, no HSM. No bruits or masses. +BS  Extremities: No cyanosis, clubbing, or rash. R and LLE no edema. warm Neuro: Alert & orientedx3, cranial nerves grossly intact. moves all 4  extremities w/o difficulty. Affect pleasant   Telemetry   NSR/sinus tach 80-100s. Frequent runs of NSVT. Rare runs of SVT. Personally reviewed.   EKG    EKG 11/11: Afib RVR 151.  Labs    CBC Recent Labs    07/19/18 0440 07/20/18 0418  WBC 10.3 8.4  HGB 13.4 10.4*  HCT 40.6 33.0*  MCV 90.2 91.7  PLT 235 189   Basic Metabolic Panel Recent Labs    32/35/57 0924  07/19/18 0440 07/20/18 0418  NA 133*   < > 133* 132*  K 5.8*   < > 3.6 3.9  CL 100   < > 102 103  CO2 19*   < > 21* 21*  GLUCOSE 169*   < > 147* 132*  BUN 21   < > 22 15  CREATININE 1.77*   < > 1.71* 1.34*  CALCIUM 8.4*   < > 8.2* 7.4*  MG 1.9  --  1.9  --    < > = values in this interval not displayed.   Liver Function Tests Recent Labs    07/17/18 1545 07/18/18 1333  AST 136* 523*  ALT 151* 590*  ALKPHOS 49 53  BILITOT 0.7 0.6  PROT 5.9* 6.3*  ALBUMIN 3.5 3.5   No results for input(s): LIPASE, AMYLASE in the last 72 hours. Cardiac Enzymes Recent Labs    07/17/18 1545  TROPONINI <0.03  BNP: BNP (last 3 results) Recent Labs    07/17/18 1546  BNP 2,003.9*    ProBNP (last 3 results) No results for input(s): PROBNP in the last 8760 hours.   D-Dimer No results for input(s): DDIMER in the last 72 hours. Hemoglobin A1C No results for input(s): HGBA1C in the last 72 hours. Fasting Lipid Panel No results for input(s): CHOL, HDL, LDLCALC, TRIG, CHOLHDL, LDLDIRECT in the last 72 hours. Thyroid Function Tests Recent Labs    07/17/18 1545  TSH 3.241    Other results:   Imaging    No results found.   Medications:     Scheduled Medications: . digoxin  0.125 mg Oral Daily  . iron polysaccharides  150 mg Oral Q2200  . mometasone-formoterol  2 puff Inhalation BID  . sodium chloride flush  3 mL Intravenous Q12H    Infusions: . sodium chloride    . amiodarone 30 mg/hr (07/20/18 0200)  . heparin 1,650 Units/hr (07/20/18 0556)  . milrinone 0.375 mcg/kg/min (07/20/18 0200)    . norepinephrine (LEVOPHED) Adult infusion 10 mcg/min (07/20/18 0200)    PRN Medications: sodium chloride, acetaminophen, albuterol, gabapentin, magnesium hydroxide, ondansetron (ZOFRAN) IV, sodium chloride flush, traZODone    Patient Profile   Eldar is a 62 -year-old male with h/o systolic HF due to NICM, mild nonobstructive CAD, PE, LV thrombus, and PAF.   Admitted from HF clinic with SVT > Afib RVR.   Assessment/Plan   1.SVT  - Was in SVT 190s on arrival to clinic yesterday. Attempted valsava, carotid massage, and adenosine without success. Then went into Afib RVR.  - No further SVT. On amio drip.  2. Paroxymsal A Fib RVR - Remains on amio drip @ 30 mg/hr. Maintaining NSR - Remains on heparin drip. Hemoglobin 13.4 > 10.4. No overt bleeding.  - Allergic to warfarin. He is on heparin drip now. Eventually transition to eliquis. Maybe today.  3. Chronic systolic HF with severe biventricular dysfunction EF 10-15% due to NICM -> Cardiogenic shock. Newly diagnosed 8/16. Echo 09/10/15 with EF 25%. Echo 7/17 EF 25-30%. Refused ICD. - Echo 05/2017 with EF 45-50%, with Grade 1 DD.EF back down in August on this year. ECHO 04/2018 25-30%.  - Echo 07/18/18: EF 10-15%, mild MR, LA and RA mod dilated, RV moderately reduced - Volume status mildly up. CVP 11-12. May need lasix today.  - Remains on NE @ 10, mirlinone @ 0.375. Coox 62%. Sherryll Burger and coreg on hold with hypotension. -Off corlanor with afib. - Failed spiro in past. Had rash.Consider eplerenone. - Continue digoxin 0.125 mg daily  4.. RLL pulmonary embolism  - In setting of low EF. Treated ~ 1year. Now off Eliquis. No change.   5. Warfarin and ASA allergies  6.Mild nonobstructive CAD by cath 04/2015 - Having chest tightness. Troponin <0.03. Allergic to ASA. Plan to start eliquis soon. - He is not on a statin.  No s/s ischemia.  7. MR  - Only mild on echo this admit 8.LV mural thrombus - LV thrombus resolved on echo 05/2017.  Eliquis stopped previously. No thrombus on echo this admit.  9. Frequnet PVCs.  - Continue amio for now. - K 3.9, mag 1.9 yesterday. Got 2 gram supp. Recheck today.  10. Anxiety - Improved.  11. Insomnia - Takes passion flower (OTC) at home. Requested iron be dose at night. No change 12. Constipation - Give sorbitol x1.  - Add PRN colace and senna 13. Anemia - Hemoglobin dropped 13 > 10.  Denies bleeding - Check anemia panel. He is already on PO iron supplement   Medication concerns reviewed with patient and pharmacy team. Barriers identified: none at this time  Length of Stay: 3  Alford Highland, NP  07/20/2018, 7:06 AM  Advanced Heart Failure Team Pager [phone] (M-F; 7a - 4p)  Please contact [facility] for night-coverage after hours (4p -7a ) and weekends on [email or web address]  Agree.   He remains critically ill on NE and milrinone. Co-ox improving. Still feels SOB and having some chest tightness. CVP 11-12. Now back in NSR on IV amio. Creatinine improving. Hgb down this am on heparin. Now obvious sites of bleeding. Asking about code status.   Weak appearing RIJ TLC Cor RRR +s3 Lungs clear Ab soft NT Ext: pale trace edema  Very tenuous. Co-ox finally improving but on dual inotropes. Wil try to wean NE slowly and continue milrinone. Diurese with IV lasix. Continue IV amio and heparin. Repeat CBC later today to make sure not actively bleeding. Discussed code status and the fact that EF should recover once rhythm stabilizes. Will need to involve EP at some point to discuss ablation.   CRITICAL CARE Performed by: [name]  Total critical care time: 35 minutes  Critical care time was exclusive of separately billable procedures and treating other patients.  Critical care was necessary to treat or prevent imminent or life-threatening deterioration.  Critical care was time spent personally by me (independent of midlevel providers or residents) on the following  activities: development of treatment plan with patient and/or surrogate as well as nursing, discussions with consultants, evaluation of patient's response to treatment, examination of patient, obtaining history from patient or surrogate, ordering and performing treatments and interventions, ordering and review of laboratory studies, ordering and review of radiographic studies, pulse oximetry and re-evaluation of patient's condition.  [name], MD  5:38 PM

## 2018-07-20 NOTE — Progress Notes (Signed)
ANTICOAGULATION CONSULT NOTE - Follow-up Consult  Pharmacy Consult for heparin Indication: atrial fibrillation  Allergies  Allergen Reactions  . Spironolactone Rash  . Aspirin     REACTION: facial swelling  . Dust Mite Extract   . Other     Cats   . Peanuts [Peanut Oil]     sob  . Warfarin And Related Rash    Patient Measurements: Height: 5' 8.5" (174 cm) Weight: 150 lb 12.7 oz (68.4 kg) IBW/kg (Calculated) : 69.55 Heparin Dosing Weight: 73.2 kg  Vital Signs: Temp: 98.1 F (36.7 C) (11/14 1712) Temp Source: Oral (11/14 1712) BP: 92/70 (11/14 1700) Pulse Rate: 91 (11/14 1700)  Labs: Recent Labs    07/18/18 1333  07/19/18 0440  07/20/18 0418 07/20/18 1223 07/20/18 1224 07/20/18 1738  HGB  --   --  13.4  --  10.4*  --  13.3  --   HCT  --   --  40.6  --  33.0*  --  41.0  --   PLT  --   --  235  --  189  --  205  --   HEPARINUNFRC  --    < > 0.28*   < > 0.12* 0.51  --  0.40  CREATININE 1.77*  --  1.71*  --  1.34*  --   --   --    < > = values in this interval not displayed.    Estimated Creatinine Clearance: 54.6 mL/min (A) (by C-G formula based on SCr of 1.34 mg/dL (H)).   Medical History: Past Medical History:  Diagnosis Date  . Acute deep vein thrombosis (DVT) of popliteal vein of right lower extremity (HCC)    a. 04/2015 -> Xarelto  . Anxiety   . Asthma   . Chronic combined systolic and diastolic CHF (congestive heart failure) (HCC)    a. 04/2015 Echo: EF 10-15%, sev diff HK, Gr 3 DD, sev MR, sev dil LA, dilated RV, mod dil RA.  . Depression   . Insomnia   . LV (left ventricular) mural thrombus    a. 04/2015 Echo: possible laminated echodense area on inferior wall, cannot exclude laminated thrombus.  Marland Kitchen NICM (nonischemic cardiomyopathy) (HCC)    a. 04/2015 Echo: EF 10-15%, sev diff HK.  . Non-obstructive CAD    a. 04/2015 Cath: LM nl, LAD 46m, LCX 20ost, RCA 20d.  Marland Kitchen NSVT (nonsustained ventricular tachycardia) (HCC)   . Pulmonary embolism on right (HCC)     a. 04/2015 -> Xarelto  . Restless leg syndrome   . Severe mitral insufficiency    a. 04/2015 Echo: Sev MR.    Medications:  Scheduled:  . digoxin  0.125 mg Oral Daily  . iron polysaccharides  150 mg Oral BID  . mometasone-formoterol  2 puff Inhalation BID  . sodium chloride flush  3 mL Intravenous Q12H    Assessment: 62 yom with hx of CHF (LVEF 25-30%), hx of LV thrombus in 2018 + hx PE; known allergy to warfarin (rash) and was transitioned to apixaban in 2016 - no longer on anticoagulation PTA. Found to be in SVT followed by Afib s/p adenosine. Starting on heparin infusion with plans for apixaban in future.  Heparin level came back therapeutic at 0.4, on 1650 units/hr. Hgb 13.3, plt 205. No s/sx of bleeding. No infusion issues.   Goal of Therapy:  Heparin level 0.3-0.7 units/ml Monitor platelets by anticoagulation protocol: Yes   Plan:  Continue heparin infusion at 1650 units/hr Check anti-Xa  level daily while on heparin Continue to monitor H&H and platelets  Thanks for allowing pharmacy to be a part of this patient's care.  Sherron Monday, PharmD Clinical Pharmacist  Pager: [phone] Phone: [phone] 07/20/2018     6:39 PM

## 2018-07-20 NOTE — Progress Notes (Signed)
ANTICOAGULATION CONSULT NOTE - Follow-up Consult  Pharmacy Consult for heparin Indication: atrial fibrillation  Allergies  Allergen Reactions  . Spironolactone Rash  . Aspirin     REACTION: facial swelling  . Dust Mite Extract   . Other     Cats   . Peanuts [Peanut Oil]     sob  . Warfarin And Related Rash    Patient Measurements: Height: 5' 8.5" (174 cm) Weight: 150 lb 12.7 oz (68.4 kg) IBW/kg (Calculated) : 69.55 Heparin Dosing Weight: 73.2 kg  Vital Signs: Temp: 98.2 F (36.8 C) (11/14 1119) Temp Source: Oral (11/14 1119) BP: 100/79 (11/14 1100) Pulse Rate: 101 (11/14 1107)  Labs: Recent Labs    07/17/18 1545  07/18/18 0924 07/18/18 1333  07/19/18 0440  07/19/18 2052 07/20/18 0418 07/20/18 1223  HGB 12.2*   < > 13.2  --   --  13.4  --   --  10.4*  --   HCT 38.5*   < > 41.7  --   --  40.6  --   --  33.0*  --   PLT 202   < > 247  --   --  235  --   --  189  --   HEPARINUNFRC  --    < > <0.10*  --    < > 0.28*   < > 0.12* 0.12* 0.51  CREATININE 1.30*   < > 1.77* 1.77*  --  1.71*  --   --  1.34*  --   TROPONINI <0.03  --   --   --   --   --   --   --   --   --    < > = values in this interval not displayed.    Estimated Creatinine Clearance: 54.6 mL/min (A) (by C-G formula based on SCr of 1.34 mg/dL (H)).   Medical History: Past Medical History:  Diagnosis Date  . Acute deep vein thrombosis (DVT) of popliteal vein of right lower extremity (HCC)    a. 04/2015 -> Xarelto  . Anxiety   . Asthma   . Chronic combined systolic and diastolic CHF (congestive heart failure) (HCC)    a. 04/2015 Echo: EF 10-15%, sev diff HK, Gr 3 DD, sev MR, sev dil LA, dilated RV, mod dil RA.  . Depression   . Insomnia   . LV (left ventricular) mural thrombus    a. 04/2015 Echo: possible laminated echodense area on inferior wall, cannot exclude laminated thrombus.  Marland Kitchen NICM (nonischemic cardiomyopathy) (HCC)    a. 04/2015 Echo: EF 10-15%, sev diff HK.  . Non-obstructive CAD    a.  04/2015 Cath: LM nl, LAD 55m, LCX 20ost, RCA 20d.  Marland Kitchen NSVT (nonsustained ventricular tachycardia) (HCC)   . Pulmonary embolism on right (HCC)    a. 04/2015 -> Xarelto  . Restless leg syndrome   . Severe mitral insufficiency    a. 04/2015 Echo: Sev MR.    Medications:  Scheduled:  . digoxin  0.125 mg Oral Daily  . iron polysaccharides  150 mg Oral BID  . mometasone-formoterol  2 puff Inhalation BID  . sodium chloride flush  3 mL Intravenous Q12H    Assessment: 62 yom with hx of CHF (LVEF 25-30%), hx of LV thrombus in 2018 + hx PE; known allergy to warfarin (rash) and was transitioned to apixaban in 2016 - no longer on anticoagulation PTA. Found to be in SVT followed by Afib s/p adenosine. Starting on heparin infusion  with plans for apixaban in future.  Heparin level therapeutic at 0.51, on 1650 units/hr. No infusion issues or s/sx of bleeding noted.   Goal of Therapy:  Heparin level 0.3-0.7 units/ml Monitor platelets by anticoagulation protocol: Yes   Plan:  Continue heparin infusion at 1650 units/hr Check confirmatory anti-Xa level in 6 hours and daily while on heparin Continue to monitor H&H and platelets  Thanks for allowing pharmacy to be a part of this patient's care.  Arvilla Market, PharmD PGY1 Pharmacy Resident Phone [phone] 07/20/2018     12:57 PM

## 2018-07-20 NOTE — Progress Notes (Signed)
ANTICOAGULATION CONSULT NOTE - Follow-up Consult  Pharmacy Consult for heparin Indication: atrial fibrillation  Allergies  Allergen Reactions  . Spironolactone Rash  . Aspirin     REACTION: facial swelling  . Dust Mite Extract   . Other     Cats   . Peanuts [Peanut Oil]     sob  . Warfarin And Related Rash    Patient Measurements: Height: 5' 8.5" (174 cm) Weight: 161 lb 6 oz (73.2 kg) IBW/kg (Calculated) : 69.55 Heparin Dosing Weight: 73.2 kg  Vital Signs: Temp: 97.7 F (36.5 C) (11/14 0400) Temp Source: Oral (11/14 0400) BP: 100/78 (11/14 0500) Pulse Rate: 90 (11/14 0500)  Labs: Recent Labs    07/17/18 1545  07/18/18 0924 07/18/18 1333  07/19/18 0440 07/19/18 1414 07/19/18 2052 07/20/18 0418  HGB 12.2*   < > 13.2  --   --  13.4  --   --  10.4*  HCT 38.5*   < > 41.7  --   --  40.6  --   --  33.0*  PLT 202   < > 247  --   --  235  --   --  189  HEPARINUNFRC  --    < > <0.10*  --    < > 0.28* 0.34 0.12* 0.12*  CREATININE 1.30*   < > 1.77* 1.77*  --  1.71*  --   --   --   TROPONINI <0.03  --   --   --   --   --   --   --   --    < > = values in this interval not displayed.    Estimated Creatinine Clearance: 43.5 mL/min (A) (by C-G formula based on SCr of 1.71 mg/dL (H)).   Medical History: Past Medical History:  Diagnosis Date  . Acute deep vein thrombosis (DVT) of popliteal vein of right lower extremity (HCC)    a. 04/2015 -> Xarelto  . Anxiety   . Asthma   . Chronic combined systolic and diastolic CHF (congestive heart failure) (HCC)    a. 04/2015 Echo: EF 10-15%, sev diff HK, Gr 3 DD, sev MR, sev dil LA, dilated RV, mod dil RA.  . Depression   . Insomnia   . LV (left ventricular) mural thrombus    a. 04/2015 Echo: possible laminated echodense area on inferior wall, cannot exclude laminated thrombus.  Marland Kitchen NICM (nonischemic cardiomyopathy) (HCC)    a. 04/2015 Echo: EF 10-15%, sev diff HK.  . Non-obstructive CAD    a. 04/2015 Cath: LM nl, LAD 33m, LCX  20ost, RCA 20d.  Marland Kitchen NSVT (nonsustained ventricular tachycardia) (HCC)   . Pulmonary embolism on right (HCC)    a. 04/2015 -> Xarelto  . Restless leg syndrome   . Severe mitral insufficiency    a. 04/2015 Echo: Sev MR.    Medications:  Scheduled:  . digoxin  0.125 mg Oral Daily  . iron polysaccharides  150 mg Oral Q2200  . mometasone-formoterol  2 puff Inhalation BID  . sodium chloride flush  3 mL Intravenous Q12H    Assessment: 62 yom with hx of CHF (LVEF 25-30%), hx of LV thrombus in 2018 + hx PE; known allergy to warfarin (rash) and was transitioned to apixaban in 2016 - no longer on anticoagulation PTA. Found to be in SVT followed by Afib s/p adenosine. Starting on heparin infusion with plans for apixaban in future  Heparin level remains subtherapeutic at 0.12, on 1550 units/hr. Hgb 10.4,  plt 189. No infusion issues or s/sx of bleeding per nursing.   Goal of Therapy:  Heparin level 0.3-0.7 units/ml Monitor platelets by anticoagulation protocol: Yes   Plan:  Increase heparin infusion to 1650 units/hr Check confirmatory anti-Xa level in 6 hours and daily while on heparin Continue to monitor H&H and platelets  Thanks for allowing pharmacy to be a part of this patient's care.  Talbert Cage, PharmD Clinical Pharmacist

## 2018-07-20 NOTE — Plan of Care (Signed)
  Problem: Clinical Measurements: Goal: Respiratory complications will improve Outcome: Progressing   Problem: Activity: Goal: Risk for activity intolerance will decrease Outcome: Progressing   Problem: Coping: Goal: Level of anxiety will decrease Outcome: Progressing   Problem: Pain Managment: Goal: General experience of comfort will improve Outcome: Progressing   Problem: Safety: Goal: Ability to remain free from injury will improve Outcome: Progressing   Problem: Skin Integrity: Goal: Risk for impaired skin integrity will decrease Outcome: Progressing   

## 2018-07-21 ENCOUNTER — Inpatient Hospital Stay: Payer: Self-pay

## 2018-07-21 LAB — CBC
HCT: 34.8 % — ABNORMAL LOW (ref 39.0–52.0)
Hemoglobin: 11.6 g/dL — ABNORMAL LOW (ref 13.0–17.0)
MCH: 30.2 pg (ref 26.0–34.0)
MCHC: 33.3 g/dL (ref 30.0–36.0)
MCV: 90.6 fL (ref 80.0–100.0)
NRBC: 0 % (ref 0.0–0.2)
Platelets: 137 10*3/uL — ABNORMAL LOW (ref 150–400)
RBC: 3.84 MIL/uL — AB (ref 4.22–5.81)
RDW: 14.2 % (ref 11.5–15.5)
WBC: 6.8 10*3/uL (ref 4.0–10.5)

## 2018-07-21 LAB — BASIC METABOLIC PANEL
Anion gap: 8 (ref 5–15)
BUN: 14 mg/dL (ref 8–23)
CO2: 26 mmol/L (ref 22–32)
CREATININE: 1.26 mg/dL — AB (ref 0.61–1.24)
Calcium: 8.2 mg/dL — ABNORMAL LOW (ref 8.9–10.3)
Chloride: 98 mmol/L (ref 98–111)
GFR calc Af Amer: >60 mL/min (ref >60)
GFR, EST NON AFRICAN AMERICAN: 59 mL/min — AB (ref >60)
Glucose, Bld: 109 mg/dL — ABNORMAL HIGH (ref 70–99)
POTASSIUM: 3.7 mmol/L (ref 3.5–5.1)
SODIUM: 132 mmol/L — AB (ref 135–145)

## 2018-07-21 LAB — MAGNESIUM: MAGNESIUM: 2.1 mg/dL (ref 1.7–2.4)

## 2018-07-21 LAB — COOXEMETRY PANEL
CARBOXYHEMOGLOBIN: 1.4 % (ref 0.5–1.5)
METHEMOGLOBIN: 1.3 % (ref 0.0–1.5)
O2 Saturation: 57.2 %
Total hemoglobin: 11.7 g/dL — ABNORMAL LOW (ref 12.0–16.0)

## 2018-07-21 LAB — HEPARIN LEVEL (UNFRACTIONATED)
HEPARIN UNFRACTIONATED: 0.54 [IU]/mL (ref 0.30–0.70)
HEPARIN UNFRACTIONATED: 0.48 [IU]/mL (ref 0.30–0.70)
Heparin Unfractionated: 0.25 IU/mL — ABNORMAL LOW (ref 0.30–0.70)

## 2018-07-21 MED ORDER — SODIUM CHLORIDE 0.9% FLUSH: 10.0000 mL | INTRAVENOUS | Status: DC | PRN

## 2018-07-21 MED ORDER — SODIUM CHLORIDE 0.9% FLUSH: 10.0000 mL | Freq: Two times a day (BID) | INTRAVENOUS | Status: DC

## 2018-07-21 MED ORDER — POTASSIUM CHLORIDE CRYS ER 20 MEQ PO TBCR
20.0000 meq | EXTENDED_RELEASE_TABLET | Freq: Once | ORAL | Status: AC
  Administered 2018-07-21: 20 meq via ORAL
  Filled 2018-07-21: qty 1

## 2018-07-21 MED ORDER — CHLORHEXIDINE GLUCONATE CLOTH 2 % EX PADS
6.0000 | MEDICATED_PAD | Freq: Every day | CUTANEOUS | Status: DC
  Administered 2018-07-21 – 2018-07-24 (×4): 6 via TOPICAL

## 2018-07-21 MED ORDER — POTASSIUM CHLORIDE CRYS ER 20 MEQ PO TBCR
40.0000 meq | EXTENDED_RELEASE_TABLET | Freq: Once | ORAL | Status: AC
  Administered 2018-07-21: 40 meq via ORAL
  Filled 2018-07-21: qty 2

## 2018-07-21 MED ORDER — FUROSEMIDE 10 MG/ML IJ SOLN
80.0000 mg | Freq: Once | INTRAMUSCULAR | Status: AC
  Administered 2018-07-21: 80 mg via INTRAVENOUS
  Filled 2018-07-21: qty 8

## 2018-07-21 MED ORDER — CHLORHEXIDINE GLUCONATE CLOTH 2 % EX PADS: 6.0000 | MEDICATED_PAD | Freq: Every day | CUTANEOUS | Status: DC

## 2018-07-21 NOTE — Progress Notes (Addendum)
Advanced Heart Failure Rounding Note  PCP-Cardiologist: No primary care provider on file.   Subjective:    Coox 57%. Remains on milrinone 0.375 mcg/kg/min. NE off since yesterday afternoon. SBP 80-90s. CVP 11-12. Received 80 mg IV lasix x 1 yesterday with sluggish UOP (had frequent BMs so may be not accurate). Weight down 2 lbs. Creatinine stable 1.26  Hmg 13.3 > 11.6. On heparin drip. No bleeding.   Remains in NSR/sinus tach on amio drip @30mg /hr. Frequent runs of NSVT with longest 26 beats. K 3.7, mag 2.1  Feels much better today. Appetite improved. Denies SOB or chest tightness. Frequent BMs yesterday with sorbitol, but now resolved. Has only gotten up OOB to [facility]. SBPs in 90s.  Echo 07/18/18: EF 10-15%, mild MR, LA and RA mod dilated, RV moderately reduced  Objective:   Weight Range: 67.4 kg Body mass index is 22.26 kg/m.   Vital Signs:   Temp:  [97.9 F (36.6 C)-98.4 F (36.9 C)] 97.9 F (36.6 C) (11/15 0400) Pulse Rate:  [69-105] 77 (11/15 0600) Resp:  [12-22] 14 (11/15 0600) BP: (80-113)/(59-86) 92/68 (11/15 0600) SpO2:  [87 %-99 %] 94 % (11/15 0600) Weight:  [67.4 kg] 67.4 kg (11/15 0434) Last BM Date: 07/20/18  Weight change: Filed Weights   07/19/18 0500 07/20/18 0500 07/21/18 0434  Weight: 73.2 kg 68.4 kg 67.4 kg    Intake/Output:   Intake/Output Summary (Last 24 hours) at 07/21/2018 0713 Last data filed at 07/21/2018 0400 Gross per 24 hour  Intake 1445.05 ml  Output 902 ml  Net 543.05 ml      Physical Exam   General: No resp difficulty.  HEENT: Normal anicteric Neck: Supple. JVP 10-11. Carotids 2+ bilat; no bruits. No thyromegaly or nodule noted. Cor: PMI nondisplaced. RRR, frequent ectopy. +s3 Lungs: fine crackles in bases no wheeze Abdomen: Soft, non-tender, non-distended, no HSM. No bruits or masses. +BS  Extremities: no cyanosis, clubbing, rash, edema Neuro: alert & oriented x 3, cranial nerves grossly intact. moves all 4 extremities  w/o difficulty. Affect pleasant   Telemetry   NSR with frequent NSVT runs, longest 26 beats. Personally reviewed.   EKG    EKG 11/11: Afib RVR 151.  Labs    CBC Recent Labs    07/20/18 1224 07/21/18 0251  WBC 10.1 6.8  HGB 13.3 11.6*  HCT 41.0 34.8*  MCV 91.1 90.6  PLT 205 137*   Basic Metabolic Panel Recent Labs    16/10/96 0418 07/21/18 0251  NA 132* 132*  K 3.9 3.7  CL 103 98  CO2 21* 26  GLUCOSE 132* 109*  BUN 15 14  CREATININE 1.34* 1.26*  CALCIUM 7.4* 8.2*  MG 2.2 2.1   Liver Function Tests Recent Labs    07/18/18 1333  AST 523*  ALT 590*  ALKPHOS 53  BILITOT 0.6  PROT 6.3*  ALBUMIN 3.5   No results for input(s): LIPASE, AMYLASE in the last 72 hours. Cardiac Enzymes No results for input(s): CKTOTAL, CKMB, CKMBINDEX, TROPONINI in the last 72 hours.  BNP: BNP (last 3 results) Recent Labs    07/17/18 1546  BNP 2,003.9*    ProBNP (last 3 results) No results for input(s): PROBNP in the last 8760 hours.   D-Dimer No results for input(s): DDIMER in the last 72 hours. Hemoglobin A1C No results for input(s): HGBA1C in the last 72 hours. Fasting Lipid Panel No results for input(s): CHOL, HDL, LDLCALC, TRIG, CHOLHDL, LDLDIRECT in the last 72 hours. Thyroid Function  Tests No results for input(s): TSH, T4TOTAL, T3FREE, THYROIDAB in the last 72 hours.  Invalid input(s): FREET3  Other results:   Imaging    No results found.   Medications:     Scheduled Medications: . digoxin  0.125 mg Oral Daily  . iron polysaccharides  150 mg Oral BID  . mometasone-formoterol  2 puff Inhalation BID  . sodium chloride flush  3 mL Intravenous Q12H    Infusions: . sodium chloride    . amiodarone 30 mg/hr (07/20/18 2000)  . ferumoxytol 468 mL/hr at 07/20/18 1500  . heparin 1,800 Units/hr (07/21/18 0333)  . milrinone 0.375 mcg/kg/min (07/20/18 2000)  . norepinephrine (LEVOPHED) Adult infusion Stopped (07/20/18 1038)    PRN  Medications: sodium chloride, acetaminophen, albuterol, docusate sodium, gabapentin, magnesium hydroxide, ondansetron (ZOFRAN) IV, senna, sodium chloride flush, traZODone    Patient Profile   Marcus Bates is a 62 -year-old male with h/o systolic HF due to NICM, mild nonobstructive CAD, PE, LV thrombus, and PAF.   Admitted from HF clinic with SVT > Afib RVR.   Assessment/Plan   1.SVT  - Was in SVT 190s on arrival to clinic yesterday. Attempted valsava, carotid massage, and adenosine without success. Then went into Afib RVR.  - No further SVT. On amio drip. No change.  2. Paroxymsal A Fib RVR - Remains on amio drip @ 30 mg/hr. Maintaining NSR - Remains on heparin drip. Hemoglobin 13.3 > 11.6. Received IV iron yesterday.  - Allergic to warfarin. He is on heparin drip now. Plan to transition to Eliquis once ready for stepdown. Possibly today.  3. Chronic systolic HF with severe biventricular dysfunction EF 10-15% due to NICM -> Cardiogenic shock. Newly diagnosed 8/16. Echo 09/10/15 with EF 25%. Echo 7/17 EF 25-30%. Refused ICD. - Echo 05/2017 with EF 45-50%, with Grade 1 DD.EF back down in August on this year. ECHO 04/2018 25-30%.  - Echo 07/18/18: EF 10-15%, mild MR, LA and RA mod dilated, RV moderately reduced - Volume status still mildly elevated. CVP ~11. Probably needs more IV lasix today. Was not taking diuretics at home.  - Now off NE. Remains on milrinone 0.375 mcg/kg/min. Coox 57%. Sherryll Burger and coreg on hold with hypotension. SBP 80-90s -Off corlanor with afib. - Failed spiro in past. Had rash.Consider eplerenone. SBP 80-90 today. Hold off for now.  - Continue digoxin 0.125 mg daily  4.. RLL pulmonary embolism  - In setting of low EF. Treated ~ 1year. Now off Eliquis. No change.  5. Warfarin and ASA allergies  6.Mild nonobstructive CAD by cath 04/2015 - Having chest tightness. Troponin <0.03. Allergic to ASA. Plan to start eliquis soon. - He is not on a statin. No s/s ischemia.   7. MR  - Only mild on echo this admit. No change.  8.LV mural thrombus - LV thrombus resolved on echo 05/2017. Eliquis stopped previously. No thrombus on echo this admit. No change.  9. Frequnet PVCs.  - Continue amio drip for now - K 3.7, mag 2.1 yesterday. Will supp 10. Anxiety - Improved. No change 11. Insomnia - Takes passion flower (OTC) at home. Requested iron be dose at night. No change.  12. Constipation - Resolved with sorbitol 13. Anemia - Hemoglobin dropped 13 > 10. Denies bleeding - Got IV iron yesterday.   Can probably move to step down today. Will discuss with MD.   Medication concerns reviewed with patient and pharmacy team. Barriers identified: none at this time  Length of Stay: 4  Alford Highland, NP  07/21/2018, 7:13 AM  Advanced Heart Failure Team Pager [phone] (M-F; 7a - 4p)  Please contact [facility] for night-coverage after hours (4p -7a ) and weekends on [email or web address]  Remains very tenuous but now off NE. Remains on milrinone 0.375. Co-ox 57%.Feeling a bit better but still with sever biventricular dysfunction. SBP low. Will continue milrinone and digoxing. Hopefully can add losartan soon. Continue to suppress AF with IV amio and hopefully cardiac function will begin to recover. Can go to SDU. Continue heparin and IV amio. Supp electrolytes. Will eventually need ablation.   [name], MD  8:09 AM

## 2018-07-21 NOTE — Progress Notes (Signed)
ANTICOAGULATION CONSULT NOTE - Follow-up Consult  Pharmacy Consult for heparin Indication: atrial fibrillation  Allergies  Allergen Reactions  . Spironolactone Rash  . Aspirin     REACTION: facial swelling  . Dust Mite Extract   . Other     Cats   . Peanuts [Peanut Oil]     sob  . Warfarin And Related Rash    Patient Measurements: Height: 5' 8.5" (174 cm) Weight: 148 lb 9.4 oz (67.4 kg) IBW/kg (Calculated) : 69.55 Heparin Dosing Weight: 67.4 kg  Vital Signs: Temp: 98.2 F (36.8 C) (11/15 0740) Temp Source: Oral (11/15 0740) BP: 92/68 (11/15 0600) Pulse Rate: 89 (11/15 0908)  Labs: Recent Labs    07/19/18 0440  07/20/18 0418  07/20/18 1224 07/20/18 1738 07/21/18 0251 07/21/18 0913  HGB 13.4  --  10.4*  --  13.3  --  11.6*  --   HCT 40.6  --  33.0*  --  41.0  --  34.8*  --   PLT 235  --  189  --  205  --  137*  --   HEPARINUNFRC 0.28*   < > 0.12*   < >  --  0.40 0.25* 0.54  CREATININE 1.71*  --  1.34*  --   --   --  1.26*  --    < > = values in this interval not displayed.    Estimated Creatinine Clearance: 57.2 mL/min (A) (by C-G formula based on SCr of 1.26 mg/dL (H)).   Medical History: Past Medical History:  Diagnosis Date  . Acute deep vein thrombosis (DVT) of popliteal vein of right lower extremity (HCC)    a. 04/2015 -> Xarelto  . Anxiety   . Asthma   . Chronic combined systolic and diastolic CHF (congestive heart failure) (HCC)    a. 04/2015 Echo: EF 10-15%, sev diff HK, Gr 3 DD, sev MR, sev dil LA, dilated RV, mod dil RA.  . Depression   . Insomnia   . LV (left ventricular) mural thrombus    a. 04/2015 Echo: possible laminated echodense area on inferior wall, cannot exclude laminated thrombus.  Marland Kitchen NICM (nonischemic cardiomyopathy) (HCC)    a. 04/2015 Echo: EF 10-15%, sev diff HK.  . Non-obstructive CAD    a. 04/2015 Cath: LM nl, LAD 77m, LCX 20ost, RCA 20d.  Marland Kitchen NSVT (nonsustained ventricular tachycardia) (HCC)   . Pulmonary embolism on right (HCC)     a. 04/2015 -> Xarelto  . Restless leg syndrome   . Severe mitral insufficiency    a. 04/2015 Echo: Sev MR.    Medications:  Scheduled:  . digoxin  0.125 mg Oral Daily  . iron polysaccharides  150 mg Oral BID  . mometasone-formoterol  2 puff Inhalation BID  . potassium chloride  20 mEq Oral Once  . sodium chloride flush  3 mL Intravenous Q12H    Assessment: 62 yom with hx of CHF (LVEF 25-30%), hx of LV thrombus in 2018 + hx PE; known allergy to warfarin (rash) and was transitioned to apixaban in 2016 - no longer on anticoagulation PTA. Found to be in SVT followed by Afib s/p adenosine. Starting on heparin infusion with plans for apixaban in future.  AM Heparin level came back therapeutic at 0.54, on 1800 units/hr. CBC slightly down, but no s/sx of bleeding or infusion issues.   Goal of Therapy:  Heparin level 0.3-0.7 units/ml Monitor platelets by anticoagulation protocol: Yes   Plan:  Continue heparin infusion at 1800 units/hr  Check confirmatory anti-Xa level and daily while on heparin Continue to monitor H&H and platelets  Thanks for allowing pharmacy to be a part of this patient's care.  Arvilla Market, PharmD PGY1 Pharmacy Resident Phone [phone] 07/21/2018     10:51 AM

## 2018-07-21 NOTE — Progress Notes (Signed)
   07/21/18 1500  Clinical Encounter Type  Visited With Health care provider;Patient;Patient not available  Visit Type Initial;Other (Comment) (AD)  Referral From Nurse  Recommendations  (Enter new [place] request when pt is ready)   Pt had wanted to complete/notarize AD, but he was on phone and asked that chaplain return later.  Spoke w/ care RN, she said need was not urgent/emergent.  Chaplain will attempt to f/u if other calls allow, otherwise, pls enter new consult request when pt is available again.  Margretta Sidle resident, [phone]

## 2018-07-21 NOTE — Progress Notes (Signed)
Peripherally Inserted Central Catheter/Midline Placement  The IV Nurse has discussed with the patient and/or persons authorized to consent for the patient, the purpose of this procedure and the potential benefits and risks involved with this procedure.  The benefits include less needle sticks, lab draws from the catheter, and the patient may be discharged home with the catheter. Risks include, but not limited to, infection, bleeding, blood clot (thrombus formation), and puncture of an artery; nerve damage and irregular heartbeat and possibility to perform a PICC exchange if needed/ordered by physician.  Alternatives to this procedure were also discussed.  Bard Power PICC patient education guide, fact sheet on infection prevention and patient information card has been provided to patient /or left at bedside.    PICC/Midline Placement Documentation  PICC Triple Lumen 07/21/18 PICC Right Brachial 40 cm 0 cm (Active)  Indication for Insertion or Continuance of Line Prolonged intravenous therapies 07/21/2018 10:11 PM  Exposed Catheter (cm) 0 cm 07/21/2018 10:11 PM  Site Assessment Clean;Dry;Intact 07/21/2018 10:11 PM  Lumen #1 Status Flushed;Saline locked;Blood return noted 07/21/2018 10:11 PM  Lumen #2 Status Flushed;Saline locked;Blood return noted 07/21/2018 10:11 PM  Lumen #3 Status Flushed;Saline locked;Blood return noted 07/21/2018 10:11 PM  Dressing Type Transparent 07/21/2018 10:11 PM  Dressing Status Clean;Dry;Intact;Antimicrobial disc in place 07/21/2018 10:11 PM  Dressing Change Due 07/28/18 07/21/2018 10:11 PM       Marcus Bates 07/21/2018, 10:12 PM

## 2018-07-21 NOTE — Progress Notes (Signed)
VAST RN spoke with unit RN, Bobetta Lime and advised her to have physician place order for PICC placement instead of IV consult. She advised she would take care of it.

## 2018-07-21 NOTE — Progress Notes (Signed)
VAST RN spoke with unit RN to ensure she was aware to discontinue CL once PICC in place. She verbalized understanding.

## 2018-07-21 NOTE — Progress Notes (Signed)
ANTICOAGULATION CONSULT NOTE - Follow-up Consult  Pharmacy Consult for heparin Indication: atrial fibrillation  Allergies  Allergen Reactions  . Spironolactone Rash  . Aspirin     REACTION: facial swelling  . Dust Mite Extract   . Other     Cats   . Peanuts [Peanut Oil]     sob  . Warfarin And Related Rash    Patient Measurements: Height: 5' 8.5" (174 cm) Weight: 148 lb 9.4 oz (67.4 kg) IBW/kg (Calculated) : 69.55 Heparin Dosing Weight: 67.4 kg  Vital Signs: Temp: 97.4 F (36.3 C) (11/15 1533) Temp Source: Axillary (11/15 1533) BP: 100/65 (11/15 1600) Pulse Rate: 78 (11/15 1600)  Labs: Recent Labs    07/19/18 0440  07/20/18 0418  07/20/18 1224  07/21/18 0251 07/21/18 0913 07/21/18 1600  HGB 13.4  --  10.4*  --  13.3  --  11.6*  --   --   HCT 40.6  --  33.0*  --  41.0  --  34.8*  --   --   PLT 235  --  189  --  205  --  137*  --   --   HEPARINUNFRC 0.28*   < > 0.12*   < >  --    < > 0.25* 0.54 0.48  CREATININE 1.71*  --  1.34*  --   --   --  1.26*  --   --    < > = values in this interval not displayed.    Estimated Creatinine Clearance: 57.2 mL/min (A) (by C-G formula based on SCr of 1.26 mg/dL (H)).   Medical History: Past Medical History:  Diagnosis Date  . Acute deep vein thrombosis (DVT) of popliteal vein of right lower extremity (HCC)    a. 04/2015 -> Xarelto  . Anxiety   . Asthma   . Chronic combined systolic and diastolic CHF (congestive heart failure) (HCC)    a. 04/2015 Echo: EF 10-15%, sev diff HK, Gr 3 DD, sev MR, sev dil LA, dilated RV, mod dil RA.  . Depression   . Insomnia   . LV (left ventricular) mural thrombus    a. 04/2015 Echo: possible laminated echodense area on inferior wall, cannot exclude laminated thrombus.  Marland Kitchen NICM (nonischemic cardiomyopathy) (HCC)    a. 04/2015 Echo: EF 10-15%, sev diff HK.  . Non-obstructive CAD    a. 04/2015 Cath: LM nl, LAD 1m, LCX 20ost, RCA 20d.  Marland Kitchen NSVT (nonsustained ventricular tachycardia) (HCC)   .  Pulmonary embolism on right (HCC)    a. 04/2015 -> Xarelto  . Restless leg syndrome   . Severe mitral insufficiency    a. 04/2015 Echo: Sev MR.    Medications:  Scheduled:  . Chlorhexidine Gluconate Cloth  6 each Topical Daily  . digoxin  0.125 mg Oral Daily  . iron polysaccharides  150 mg Oral BID  . mometasone-formoterol  2 puff Inhalation BID  . sodium chloride flush  3 mL Intravenous Q12H    Assessment: 62 yom with hx of CHF (LVEF 25-30%), hx of LV thrombus in 2018 + hx PE; known allergy to warfarin (rash) and was transitioned to apixaban in 2016 - no longer on anticoagulation PTA. Found to be in SVT followed by Afib s/p adenosine. Starting on heparin infusion with plans for apixaban in future.  AM Heparin level came back therapeutic at 0.48, on 1800 units/hr. CBC slightly down, but no s/sx of bleeding or infusion issues.   Goal of Therapy:  Heparin level  0.3-0.7 units/ml Monitor platelets by anticoagulation protocol: Yes   Plan:  Continue heparin infusion at 1800 units/hr Check confirmatory anti-Xa level and daily while on heparin Continue to monitor H&H and platelets   Jenetta Downer, [facility] Clinical Pharmacist Phone [phone]  07/21/2018 5:57 PM

## 2018-07-21 NOTE — Progress Notes (Signed)
ANTICOAGULATION CONSULT NOTE - Follow Up Consult  Pharmacy Consult for heparin Indication: atrial fibrillation  Labs: Recent Labs    07/18/18 1333  07/19/18 0440  07/20/18 0418 07/20/18 1223 07/20/18 1224 07/20/18 1738 07/21/18 0251  HGB  --   --  13.4  --  10.4*  --  13.3  --  11.6*  HCT  --   --  40.6  --  33.0*  --  41.0  --  34.8*  PLT  --   --  235  --  189  --  205  --  137*  HEPARINUNFRC  --    < > 0.28*   < > 0.12* 0.51  --  0.40 0.25*  CREATININE 1.77*  --  1.71*  --  1.34*  --   --   --   --    < > = values in this interval not displayed.    Assessment: 62yo male now subtherapeutic on heparin after two levels at goal though had been trending down; RN reports no gtt issues or signs of bleeding.  Goal of Therapy:  Heparin level 0.3-0.7 units/ml   Plan:  Will increase heparin gtt by 10% to 1800 units/hr and check level in 6 hours.    Vernard Gambles, PharmD, BCPS  07/21/2018,3:32 AM

## 2018-07-21 NOTE — Plan of Care (Signed)

## 2018-07-22 LAB — CBC
HCT: 35.5 % — ABNORMAL LOW (ref 39.0–52.0)
Hemoglobin: 11.6 g/dL — ABNORMAL LOW (ref 13.0–17.0)
MCH: 30 pg (ref 26.0–34.0)
MCHC: 32.7 g/dL (ref 30.0–36.0)
MCV: 91.7 fL (ref 80.0–100.0)
NRBC: 0 % (ref 0.0–0.2)
PLATELETS: 143 10*3/uL — AB (ref 150–400)
RBC: 3.87 MIL/uL — AB (ref 4.22–5.81)
RDW: 14.3 % (ref 11.5–15.5)
WBC: 5.1 10*3/uL (ref 4.0–10.5)

## 2018-07-22 LAB — BASIC METABOLIC PANEL
ANION GAP: 8 (ref 5–15)
BUN: 11 mg/dL (ref 8–23)
CALCIUM: 8.5 mg/dL — AB (ref 8.9–10.3)
CHLORIDE: 100 mmol/L (ref 98–111)
CO2: 27 mmol/L (ref 22–32)
CREATININE: 1.23 mg/dL (ref 0.61–1.24)
GFR calc non Af Amer: >60 mL/min (ref >60)
Glucose, Bld: 92 mg/dL (ref 70–99)
Potassium: 4 mmol/L (ref 3.5–5.1)
SODIUM: 135 mmol/L (ref 135–145)

## 2018-07-22 LAB — HEPARIN LEVEL (UNFRACTIONATED): HEPARIN UNFRACTIONATED: 0.47 [IU]/mL (ref 0.30–0.70)

## 2018-07-22 LAB — MAGNESIUM: Magnesium: 2 mg/dL (ref 1.7–2.4)

## 2018-07-22 LAB — COOXEMETRY PANEL
Carboxyhemoglobin: 1.3 % (ref 0.5–1.5)
METHEMOGLOBIN: 1.8 % — AB (ref 0.0–1.5)
O2 Saturation: 74.6 %
TOTAL HEMOGLOBIN: 12 g/dL (ref 12.0–16.0)

## 2018-07-22 MED ORDER — FUROSEMIDE 40 MG PO TABS
40.0000 mg | ORAL_TABLET | Freq: Every day | ORAL | Status: DC
  Administered 2018-07-22 – 2018-07-25 (×4): 40 mg via ORAL
  Filled 2018-07-22 (×5): qty 1

## 2018-07-22 MED ORDER — APIXABAN 5 MG PO TABS
5.0000 mg | ORAL_TABLET | Freq: Two times a day (BID) | ORAL | Status: DC
  Administered 2018-07-22 – 2018-07-26 (×9): 5 mg via ORAL
  Filled 2018-07-22 (×9): qty 1

## 2018-07-22 NOTE — Progress Notes (Signed)
ANTICOAGULATION CONSULT NOTE - Follow-up Consult  Pharmacy Consult for heparin Indication: atrial fibrillation  Allergies  Allergen Reactions  . Spironolactone Rash  . Aspirin     REACTION: facial swelling  . Dust Mite Extract   . Other     Cats   . Peanuts [Peanut Oil]     sob  . Warfarin And Related Rash    Patient Measurements: Height: 5' 8.5" (174 cm) Weight: 156 lb 4.9 oz (70.9 kg) IBW/kg (Calculated) : 69.55 Heparin Dosing Weight: 67.4 kg  Vital Signs: Temp: 98.4 F (36.9 C) (11/16 0726) Temp Source: Oral (11/16 0726) BP: 105/65 (11/16 0800) Pulse Rate: 82 (11/16 0800)  Labs: Recent Labs    07/20/18 0418  07/20/18 1224  07/21/18 0251 07/21/18 0913 07/21/18 1600 07/22/18 0503  HGB 10.4*  --  13.3  --  11.6*  --   --  11.6*  HCT 33.0*  --  41.0  --  34.8*  --   --  35.5*  PLT 189  --  205  --  137*  --   --  143*  HEPARINUNFRC 0.12*   < >  --    < > 0.25* 0.54 0.48 0.47  CREATININE 1.34*  --   --   --  1.26*  --   --  1.23   < > = values in this interval not displayed.    Estimated Creatinine Clearance: 60.5 mL/min (by C-G formula based on SCr of 1.23 mg/dL).   Medical History: Past Medical History:  Diagnosis Date  . Acute deep vein thrombosis (DVT) of popliteal vein of right lower extremity (HCC)    a. 04/2015 -> Xarelto  . Anxiety   . Asthma   . Chronic combined systolic and diastolic CHF (congestive heart failure) (HCC)    a. 04/2015 Echo: EF 10-15%, sev diff HK, Gr 3 DD, sev MR, sev dil LA, dilated RV, mod dil RA.  . Depression   . Insomnia   . LV (left ventricular) mural thrombus    a. 04/2015 Echo: possible laminated echodense area on inferior wall, cannot exclude laminated thrombus.  Marland Kitchen NICM (nonischemic cardiomyopathy) (HCC)    a. 04/2015 Echo: EF 10-15%, sev diff HK.  . Non-obstructive CAD    a. 04/2015 Cath: LM nl, LAD 32m, LCX 20ost, RCA 20d.  Marland Kitchen NSVT (nonsustained ventricular tachycardia) (HCC)   . Pulmonary embolism on right (HCC)    a. 04/2015 -> Xarelto  . Restless leg syndrome   . Severe mitral insufficiency    a. 04/2015 Echo: Sev MR.    Medications:  Scheduled:  . Chlorhexidine Gluconate Cloth  6 each Topical Daily  . digoxin  0.125 mg Oral Daily  . iron polysaccharides  150 mg Oral BID  . mometasone-formoterol  2 puff Inhalation BID  . sodium chloride flush  10-40 mL Intracatheter Q12H  . sodium chloride flush  3 mL Intravenous Q12H    Assessment: 62 yom with hx of CHF (LVEF 25-30%), hx of LV thrombus in 2018 + hx PE; known allergy to warfarin (rash) and was transitioned to apixaban in 2016 - no longer on anticoagulation PTA. Found to be in SVT followed by Afib s/p adenosine. Starting on heparin infusion with plans for apixaban in future.  AM Heparin level came back therapeutic at 0.47, on 1800 units/hr. H/H low stable, no s/sx of bleeding or infusion issues.   Goal of Therapy:  Heparin level 0.3-0.7 units/ml Monitor platelets by anticoagulation protocol: Yes   Plan:  Continue heparin infusion at 1800 units/hr F/U plan for long term AC Continue to monitor H&H and platelets   Leota Sauers Pharm.D. CPP, BCPS Clinical Pharmacist [phone] 07/22/2018 9:26 AM

## 2018-07-22 NOTE — Progress Notes (Signed)
Advanced Heart Failure Rounding Note  PCP-Cardiologist: No primary care provider on file.   Subjective:    Coox 75%. Remains on milrinone 0.375 mcg/kg/min. Diuresed well yesterday. Central line out. PICC in.   Feeling better. Appetite improved. No SOB. SBP 95-105.   Hgb stable 11.6. On heparin drip. No bleeding.   Remains in NSR/sinus tach on amio drip @30mg /hr.  Echo 07/18/18: EF 10-15%, mild MR, LA and RA mod dilated, RV moderately reduced  Objective:   Weight Range: 70.9 kg Body mass index is 23.42 kg/m.   Vital Signs:   Temp:  [97.4 F (36.3 C)-98.5 F (36.9 C)] 98.4 F (36.9 C) (11/16 0726) Pulse Rate:  [72-101] 82 (11/16 0800) Resp:  [13-21] 19 (11/16 0800) BP: (88-139)/(59-94) 105/65 (11/16 0800) SpO2:  [87 %-99 %] 91 % (11/16 0800) Weight:  [70.9 kg] 70.9 kg (11/16 0500) Last BM Date: 07/21/18  Weight change: Filed Weights   07/20/18 0500 07/21/18 0434 07/22/18 0500  Weight: 68.4 kg 67.4 kg 70.9 kg    Intake/Output:   Intake/Output Summary (Last 24 hours) at 07/22/2018 0948 Last data filed at 07/22/2018 0900 Gross per 24 hour  Intake 1486.63 ml  Output 2950 ml  Net -1463.37 ml      Physical Exam   General:  Well appearing. No resp difficulty HEENT: normal Neck: supple. RIJ TLC  Carotids 2+ bilat; no bruits. No lymphadenopathy or thryomegaly appreciated. Cor: PMI laterally displaced. Regular rate & rhythm. +s3 Lungs: clear Abdomen: soft, nontender, nondistended. No hepatosplenomegaly. No bruits or masses. Good bowel sounds. Extremities: no cyanosis, clubbing, rash, edema +PICC Neuro: alert & orientedx3, cranial nerves grossly intact. moves all 4 extremities w/o difficulty. Affect pleasant  Telemetry   NSR 80-100s Personally reviewed.    Labs    CBC Recent Labs    07/21/18 0251 07/22/18 0503  WBC 6.8 5.1  HGB 11.6* 11.6*  HCT 34.8* 35.5*  MCV 90.6 91.7  PLT 137* 143*   Basic Metabolic Panel Recent Labs    29/56/21 0251  07/22/18 0503  NA 132* 135  K 3.7 4.0  CL 98 100  CO2 26 27  GLUCOSE 109* 92  BUN 14 11  CREATININE 1.26* 1.23  CALCIUM 8.2* 8.5*  MG 2.1 2.0   Liver Function Tests No results for input(s): AST, ALT, ALKPHOS, BILITOT, PROT, ALBUMIN in the last 72 hours. No results for input(s): LIPASE, AMYLASE in the last 72 hours. Cardiac Enzymes No results for input(s): CKTOTAL, CKMB, CKMBINDEX, TROPONINI in the last 72 hours.  BNP: BNP (last 3 results) Recent Labs    07/17/18 1546  BNP 2,003.9*    ProBNP (last 3 results) No results for input(s): PROBNP in the last 8760 hours.   D-Dimer No results for input(s): DDIMER in the last 72 hours. Hemoglobin A1C No results for input(s): HGBA1C in the last 72 hours. Fasting Lipid Panel No results for input(s): CHOL, HDL, LDLCALC, TRIG, CHOLHDL, LDLDIRECT in the last 72 hours. Thyroid Function Tests No results for input(s): TSH, T4TOTAL, T3FREE, THYROIDAB in the last 72 hours.  Invalid input(s): FREET3  Other results:   Imaging    [place] Ekg Site Rite  Result Date: 07/21/2018 If Site Rite image not attached, placement could not be confirmed due to current cardiac rhythm.    Medications:     Scheduled Medications: . Chlorhexidine Gluconate Cloth  6 each Topical Daily  . digoxin  0.125 mg Oral Daily  . iron polysaccharides  150 mg Oral BID  .  mometasone-formoterol  2 puff Inhalation BID  . sodium chloride flush  10-40 mL Intracatheter Q12H  . sodium chloride flush  3 mL Intravenous Q12H    Infusions: . sodium chloride    . amiodarone 30 mg/hr (07/22/18 0900)  . ferumoxytol 468 mL/hr at 07/20/18 1500  . heparin 1,800 Units/hr (07/22/18 0900)  . milrinone 0.375 mcg/kg/min (07/22/18 0900)  . norepinephrine (LEVOPHED) Adult infusion Stopped (07/20/18 1038)    PRN Medications: sodium chloride, acetaminophen, albuterol, docusate sodium, gabapentin, magnesium hydroxide, ondansetron (ZOFRAN) IV, senna, sodium chloride flush,  sodium chloride flush, sodium chloride flush, traZODone    Patient Profile   Corbit is a 62 -year-old male with h/o systolic HF due to NICM, mild nonobstructive CAD, PE, LV thrombus, and PAF.   Admitted from HF clinic with SVT > Afib RVR.   Assessment/Plan   1. Paroxymsal A Fib RVR/SVT - Remains on amio drip @ 30 mg/hr. Maintaining NSR - Remains on heparin drip. Hemoglobin stable 11.6. Received IV iron on 11/14  - Allergic to warfarin. He is on heparin drip now. Switch to Eliquis 5 bid. Discussed dosing with PharmD personally.  2. Chronic systolic HF with severe biventricular dysfunction EF 10-15% due to NICM -> Cardiogenic shock. Newly diagnosed 8/16. Echo 09/10/15 with EF 25%. Echo 7/17 EF 25-30%. Refused ICD. - Echo 05/2017 with EF 45-50%, with Grade 1 DD.EF back down in August on this year. ECHO 04/2018 25-30%.  - Echo 07/18/18: EF 10-15%, mild MR, LA and RA mod dilated, RV moderately reduced - Volume status looks good. CVP down to 7. Switch to lasix 80 po daily - Now off NE. Remains on milrinone 0.375 mcg/kg/min. Coox 75%.Wil decrease milrinone to 0.25 - Entresto and coreg on hold with hypotension. SBP 80-90s -Off corlanor with afib. - Failed spiro in past. Had rash.Consider eplerenone. - Continue digoxin 0.125 mg daily  - start losartan 12.5mg  qhs  3.Mild nonobstructive CAD by cath 04/2015 - Troponin <0.03. Allergic to ASA. Starting eliquis - He is not on a statin. No s/s ischemia.   4. Frequnet PVCs.  - Continue amio drip for now - Keep K > 4.0 Mg > 2.0  5. Anemia - Hemoglobin stable 11.6 - Got IV iron 11/14  Transfer to SDU. Continue to wean intoropes   Medication concerns reviewed with patient and pharmacy team. Barriers identified: none at this time  Length of Stay: 5  [name], MD  07/22/2018, 9:48 AM  Advanced Heart Failure Team Pager [phone] (M-F; 7a - 4p)  Please contact [facility] for night-coverage after hours (4p -7a ) and weekends on  [email or web address]

## 2018-07-23 LAB — BASIC METABOLIC PANEL
Anion gap: 10 (ref 5–15)
BUN: 12 mg/dL (ref 8–23)
CO2: 25 mmol/L (ref 22–32)
CREATININE: 1.33 mg/dL — AB (ref 0.61–1.24)
Calcium: 8.6 mg/dL — ABNORMAL LOW (ref 8.9–10.3)
Chloride: 99 mmol/L (ref 98–111)
GFR calc Af Amer: >60 mL/min (ref >60)
GFR, EST NON AFRICAN AMERICAN: 55 mL/min — AB (ref >60)
Glucose, Bld: 221 mg/dL — ABNORMAL HIGH (ref 70–99)
POTASSIUM: 3.9 mmol/L (ref 3.5–5.1)
Sodium: 134 mmol/L — ABNORMAL LOW (ref 135–145)

## 2018-07-23 LAB — CBC
HCT: 35.1 % — ABNORMAL LOW (ref 39.0–52.0)
HEMOGLOBIN: 11.3 g/dL — AB (ref 13.0–17.0)
MCH: 29.9 pg (ref 26.0–34.0)
MCHC: 32.2 g/dL (ref 30.0–36.0)
MCV: 92.9 fL (ref 80.0–100.0)
Platelets: 153 10*3/uL (ref 150–400)
RBC: 3.78 MIL/uL — ABNORMAL LOW (ref 4.22–5.81)
RDW: 14.6 % (ref 11.5–15.5)
WBC: 4.8 10*3/uL (ref 4.0–10.5)
nRBC: 0 % (ref 0.0–0.2)

## 2018-07-23 LAB — COOXEMETRY PANEL
Carboxyhemoglobin: 1.6 % — ABNORMAL HIGH (ref 0.5–1.5)
Methemoglobin: 1 % (ref 0.0–1.5)
O2 SAT: 72.7 %
Total hemoglobin: 10.3 g/dL — ABNORMAL LOW (ref 12.0–16.0)

## 2018-07-23 LAB — MAGNESIUM: Magnesium: 2 mg/dL (ref 1.7–2.4)

## 2018-07-23 MED ORDER — LOSARTAN POTASSIUM 25 MG PO TABS
12.5000 mg | ORAL_TABLET | Freq: Every day | ORAL | Status: DC
  Administered 2018-07-23: 12.5 mg via ORAL
  Filled 2018-07-23: qty 1

## 2018-07-23 MED ORDER — ALTEPLASE 2 MG IJ SOLR
2.0000 mg | Freq: Once | INTRAMUSCULAR | Status: AC
  Administered 2018-07-23: 2 mg

## 2018-07-23 NOTE — Progress Notes (Signed)
Advanced Heart Failure Rounding Note  PCP-Cardiologist: No primary care provider on file.   Subjective:    Milrinone turned down to 0.25 yesterday.   Feels ok. No CP or SOB. Remains in NSR on IV amio. Heparin switched to Eliquis. No bleeding. Co-ox 73%   Echo 07/18/18: EF 10-15%, mild MR, LA and RA mod dilated, RV moderately reduced  Objective:   Weight Range: 70.5 kg Body mass index is 23.3 kg/m.   Vital Signs:   Temp:  [98.3 F (36.8 C)-98.5 F (36.9 C)] 98.3 F (36.8 C) (11/17 0719) Pulse Rate:  [60-97] 73 (11/17 1010) Resp:  [10-24] 16 (11/17 1010) BP: (83-119)/(47-97) 89/54 (11/17 1010) SpO2:  [93 %-99 %] 96 % (11/17 1010) Weight:  [70.5 kg] 70.5 kg (11/17 0500) Last BM Date: 07/21/18  Weight change: Filed Weights   07/21/18 0434 07/22/18 0500 07/23/18 0500  Weight: 67.4 kg 70.9 kg 70.5 kg    Intake/Output:   Intake/Output Summary (Last 24 hours) at 07/23/2018 1301 Last data filed at 07/23/2018 1100 Gross per 24 hour  Intake 1319.26 ml  Output 1900 ml  Net -580.74 ml      Physical Exam   General:  Well appearing. No resp difficulty HEENT: normal Neck: supple. no JVD. Carotids 2+ bilat; no bruits. No lymphadenopathy or thryomegaly appreciated. Cor: PMI laterally displaced. Regular rate & rhythm. No rubs, gallops or murmurs. Lungs: clear Abdomen: soft, nontender, nondistended. No hepatosplenomegaly. No bruits or masses. Good bowel sounds. Extremities: no cyanosis, clubbing, rash, edema Neuro: alert & orientedx3, cranial nerves grossly intact. moves all 4 extremities w/o difficulty. Affect pleasant   Telemetry   NSR 80s several runs NSVT 3-12 beatsPersonally reviewed.    Labs    CBC Recent Labs    07/22/18 0503 07/23/18 0501  WBC 5.1 4.8  HGB 11.6* 11.3*  HCT 35.5* 35.1*  MCV 91.7 92.9  PLT 143* 153   Basic Metabolic Panel Recent Labs    16/10/96 0503 07/23/18 0501  NA 135 134*  K 4.0 3.9  CL 100 99  CO2 27 25  GLUCOSE 92  221*  BUN 11 12  CREATININE 1.23 1.33*  CALCIUM 8.5* 8.6*  MG 2.0 2.0   Liver Function Tests No results for input(s): AST, ALT, ALKPHOS, BILITOT, PROT, ALBUMIN in the last 72 hours. No results for input(s): LIPASE, AMYLASE in the last 72 hours. Cardiac Enzymes No results for input(s): CKTOTAL, CKMB, CKMBINDEX, TROPONINI in the last 72 hours.  BNP: BNP (last 3 results) Recent Labs    07/17/18 1546  BNP 2,003.9*    ProBNP (last 3 results) No results for input(s): PROBNP in the last 8760 hours.   D-Dimer No results for input(s): DDIMER in the last 72 hours. Hemoglobin A1C No results for input(s): HGBA1C in the last 72 hours. Fasting Lipid Panel No results for input(s): CHOL, HDL, LDLCALC, TRIG, CHOLHDL, LDLDIRECT in the last 72 hours. Thyroid Function Tests No results for input(s): TSH, T4TOTAL, T3FREE, THYROIDAB in the last 72 hours.  Invalid input(s): FREET3  Other results:   Imaging    No results found.   Medications:     Scheduled Medications: . apixaban  5 mg Oral BID  . Chlorhexidine Gluconate Cloth  6 each Topical Daily  . digoxin  0.125 mg Oral Daily  . furosemide  40 mg Oral Daily  . iron polysaccharides  150 mg Oral BID  . mometasone-formoterol  2 puff Inhalation BID    Infusions: . amiodarone 30 mg/hr (  07/23/18 1154)  . ferumoxytol 468 mL/hr at 07/20/18 1500  . milrinone 0.25 mcg/kg/min (07/23/18 1100)  . norepinephrine (LEVOPHED) Adult infusion Stopped (07/20/18 1038)    PRN Medications: acetaminophen, albuterol, docusate sodium, gabapentin, magnesium hydroxide, ondansetron (ZOFRAN) IV, senna, sodium chloride flush, traZODone    Patient Profile   Marcus Bates is a 62 -year-old male with h/o systolic HF due to NICM, mild nonobstructive CAD, PE, LV thrombus, and PAF.   Admitted from HF clinic with SVT > Afib RVR.   Assessment/Plan   1. Paroxymsal A Fib RVR/SVT - Remains on amio drip @ 30 mg/hr. Maintaining NSR - Now on Eliquis. Hemoglobin  stable 11.3. Received IV iron on 11/14  - Allergic to warfarin.  2. Chronic systolic HF with severe biventricular dysfunction EF 10-15% due to NICM -> Cardiogenic shock. Newly diagnosed 8/16. Echo 09/10/15 with EF 25%. Echo 7/17 EF 25-30%. Refused ICD. - Echo 05/2017 with EF 45-50%, with Grade 1 DD.EF back down in August on this year. ECHO 04/2018 25-30%.  - Echo 07/18/18: EF 10-15%, mild MR, LA and RA mod dilated, RV moderately reduced - Volume status looks good. On oral lasix - Now off NE. Milrinone 0.25 mcg/kg/min. Coox 73%.Wil decrease milrinone to 0.125 - Entresto and coreg on hold with hypotension. SBP 80-90s -Off corlanor with afib. - Failed spiro in past. Had rash.Consider eplerenone. - Continue digoxin 0.125 mg daily  - Start losartan 12.5mg  qhs - No b-blocker yet with low output  3.Mild nonobstructive CAD by cath 04/2015 - Troponin <0.03. Allergic to ASA. Starting eliquis - He is not on a statin. No s/s ischemia.   4. [name] PVCs/NSVT.  - Continue amio drip for now - Keep K > 4.0 Mg > 2.0 - Will need to consider LifeVest  5. Anemia - Hemoglobin stable 11.3 - Got IV iron 11/14  Transfer to SDU. Continue to wean intoropes   Medication concerns reviewed with patient and pharmacy team. Barriers identified: none at this time  Length of Stay: 6  [name], MD  07/23/2018, 1:01 PM  Advanced Heart Failure Team Pager [phone] (M-F; 7a - 4p)  Please contact [facility] for night-coverage after hours (4p -7a ) and weekends on [email or web address]

## 2018-07-23 NOTE — Progress Notes (Signed)
Co-ox draw delayed this morning. IV team is currently declotting PICC line. RN will redraw when patent.

## 2018-07-24 LAB — CBC
HCT: 39.1 % (ref 39.0–52.0)
HEMOGLOBIN: 12.6 g/dL — AB (ref 13.0–17.0)
MCH: 30 pg (ref 26.0–34.0)
MCHC: 32.2 g/dL (ref 30.0–36.0)
MCV: 93.1 fL (ref 80.0–100.0)
Platelets: 166 10*3/uL (ref 150–400)
RBC: 4.2 MIL/uL — ABNORMAL LOW (ref 4.22–5.81)
RDW: 14.8 % (ref 11.5–15.5)
WBC: 5.5 10*3/uL (ref 4.0–10.5)
nRBC: 0 % (ref 0.0–0.2)

## 2018-07-24 LAB — HEPATIC FUNCTION PANEL
ALBUMIN: 3.4 g/dL — AB (ref 3.5–5.0)
ALT: 185 U/L — ABNORMAL HIGH (ref 0–44)
AST: 55 U/L — ABNORMAL HIGH (ref 15–41)
Alkaline Phosphatase: 45 U/L (ref 38–126)
BILIRUBIN DIRECT: 0.2 mg/dL (ref 0.0–0.2)
BILIRUBIN INDIRECT: 0.5 mg/dL (ref 0.3–0.9)
TOTAL PROTEIN: 6.3 g/dL — AB (ref 6.5–8.1)
Total Bilirubin: 0.7 mg/dL (ref 0.3–1.2)

## 2018-07-24 LAB — COOXEMETRY PANEL
Carboxyhemoglobin: 1.4 % (ref 0.5–1.5)
METHEMOGLOBIN: 1 % (ref 0.0–1.5)
O2 Saturation: 64.5 %
TOTAL HEMOGLOBIN: 12.9 g/dL (ref 12.0–16.0)

## 2018-07-24 LAB — BASIC METABOLIC PANEL
Anion gap: 8 (ref 5–15)
BUN: 13 mg/dL (ref 8–23)
CALCIUM: 8.8 mg/dL — AB (ref 8.9–10.3)
CO2: 27 mmol/L (ref 22–32)
Chloride: 101 mmol/L (ref 98–111)
Creatinine, Ser: 1.24 mg/dL (ref 0.61–1.24)
GFR calc Af Amer: >60 mL/min (ref >60)
GLUCOSE: 93 mg/dL (ref 70–99)
POTASSIUM: 4.3 mmol/L (ref 3.5–5.1)
Sodium: 136 mmol/L (ref 135–145)

## 2018-07-24 LAB — MAGNESIUM: MAGNESIUM: 2.1 mg/dL (ref 1.7–2.4)

## 2018-07-24 MED ORDER — LOSARTAN POTASSIUM 25 MG PO TABS
12.5000 mg | ORAL_TABLET | Freq: Two times a day (BID) | ORAL | Status: DC
  Administered 2018-07-24 (×2): 12.5 mg via ORAL
  Filled 2018-07-24 (×2): qty 1

## 2018-07-24 MED ORDER — AMIODARONE HCL 200 MG PO TABS
200.0000 mg | ORAL_TABLET | Freq: Two times a day (BID) | ORAL | Status: DC
  Administered 2018-07-24 – 2018-07-26 (×5): 200 mg via ORAL
  Filled 2018-07-24 (×5): qty 1

## 2018-07-24 NOTE — Care Management (Signed)
#   1.  S/W    DEE    @   [facility]  [facility]  # [phone]  1. ELIQUIS   2.5 MG  BID COVER- YES CO-PAY - $ 100.00 TIER- 2 DRUG PRIOR APPROVAL- NO  2. ELIQUIS    5 MG BID COVER  - YES CO-PAY- $ 100.00 TIER-  2 DRUG PRIOR APPROVAL- NO  NO DEDUCTIBLE 25 % CO-INS NOT TO EXCEED   $ 100.00  PREFERRED PHARMACY : YES  -  [facility]

## 2018-07-24 NOTE — Progress Notes (Signed)
QTc: 0.40 at 1217 and 0.33 at 1600. HS [employer]

## 2018-07-24 NOTE — Progress Notes (Signed)
CVP was 3. In the morning. HS [employer]

## 2018-07-24 NOTE — Progress Notes (Signed)
Pt had 8 beats of VT at 1829. Pt denies light headed or dizziness, but he felt little flutter feeling very short then it went away. NP Janine notified regarding this and continue to monitor cardiac rhythm. HS [employer]

## 2018-07-24 NOTE — Progress Notes (Addendum)
Advanced Heart Failure Rounding Note  PCP-Cardiologist: No primary care provider on file.   Subjective:    Yesterday milrinone was cut back to 0.125 mcg. Remains on amiodarone 30 mg per hour.   CO-OX 64.5%.   Denies SOB. No chest pain.    Echo 07/18/18: EF 10-15%, mild MR, LA and RA mod dilated, RV moderately reduced  Objective:   Weight Range: 70.8 kg Body mass index is 23.39 kg/m.   Vital Signs:   Temp:  [97.8 F (36.6 C)-98.5 F (36.9 C)] 98.1 F (36.7 C) (11/18 0401) Pulse Rate:  [60-88] 76 (11/18 0401) Resp:  [11-21] 21 (11/18 0400) BP: (84-118)/(49-91) 95/65 (11/18 0401) SpO2:  [94 %-99 %] 96 % (11/18 0401) Weight:  [70.8 kg] 70.8 kg (11/18 0401) Last BM Date: 07/21/18  Weight change: Filed Weights   07/22/18 0500 07/23/18 0500 07/24/18 0401  Weight: 70.9 kg 70.5 kg 70.8 kg    Intake/Output:   Intake/Output Summary (Last 24 hours) at 07/24/2018 0731 Last data filed at 07/24/2018 0600 Gross per 24 hour  Intake 1606.83 ml  Output 1575 ml  Net 31.83 ml      Physical Exam   General:  In bed. . No resp difficulty HEENT: normal Neck: supple. JVP 6-7. Carotids 2+ bilat; no bruits. No lymphadenopathy or thryomegaly appreciated. Cor: PMI nondisplaced. Regular rate & rhythm. No rubs, gallops or murmurs. Lungs: clear Abdomen: soft, nontender, nondistended. No hepatosplenomegaly. No bruits or masses. Good bowel sounds. Extremities: no cyanosis, clubbing, rash, edema. RUE PICC  Neuro: alert & orientedx3, cranial nerves grossly intact. moves all 4 extremities w/o difficulty. Affect pleasant   Telemetry   NSR 70-80s with PVCs.    Labs    CBC Recent Labs    07/23/18 0501 07/24/18 0400  WBC 4.8 5.5  HGB 11.3* 12.6*  HCT 35.1* 39.1  MCV 92.9 93.1  PLT 153 166   Basic Metabolic Panel Recent Labs    16/10/96 0501 07/24/18 0400  NA 134* 136  K 3.9 4.3  CL 99 101  CO2 25 27  GLUCOSE 221* 93  BUN 12 13  CREATININE 1.33* 1.24  CALCIUM  8.6* 8.8*  MG 2.0 2.1   Liver Function Tests Recent Labs    07/24/18 0400  AST 55*  ALT 185*  ALKPHOS 45  BILITOT 0.7  PROT 6.3*  ALBUMIN 3.4*   No results for input(s): LIPASE, AMYLASE in the last 72 hours. Cardiac Enzymes No results for input(s): CKTOTAL, CKMB, CKMBINDEX, TROPONINI in the last 72 hours.  BNP: BNP (last 3 results) Recent Labs    07/17/18 1546  BNP 2,003.9*    ProBNP (last 3 results) No results for input(s): PROBNP in the last 8760 hours.   D-Dimer No results for input(s): DDIMER in the last 72 hours. Hemoglobin A1C No results for input(s): HGBA1C in the last 72 hours. Fasting Lipid Panel No results for input(s): CHOL, HDL, LDLCALC, TRIG, CHOLHDL, LDLDIRECT in the last 72 hours. Thyroid Function Tests No results for input(s): TSH, T4TOTAL, T3FREE, THYROIDAB in the last 72 hours.  Invalid input(s): FREET3  Other results:   Imaging    No results found.   Medications:     Scheduled Medications: . apixaban  5 mg Oral BID  . Chlorhexidine Gluconate Cloth  6 each Topical Daily  . digoxin  0.125 mg Oral Daily  . furosemide  40 mg Oral Daily  . iron polysaccharides  150 mg Oral BID  . losartan  12.5 mg  Oral QHS  . mometasone-formoterol  2 puff Inhalation BID    Infusions: . amiodarone 30 mg/hr (07/23/18 2359)  . ferumoxytol 468 mL/hr at 07/20/18 1500  . milrinone 0.125 mcg/kg/min (07/24/18 0000)  . norepinephrine (LEVOPHED) Adult infusion Stopped (07/20/18 1038)    PRN Medications: acetaminophen, albuterol, docusate sodium, gabapentin, magnesium hydroxide, ondansetron (ZOFRAN) IV, senna, sodium chloride flush, traZODone    Patient Profile   Marcus Bates is a 62 -year-old male with h/o systolic HF due to NICM, mild nonobstructive CAD, PE, LV thrombus, and PAF.   Admitted from HF clinic with SVT > Afib RVR.   Assessment/Plan   1. Paroxymsal A Fib RVR/SVT - Stopping milrinone. Stop amio drip. Start amiodarone 200 mg twice a day.  -   Maintaining NSR - Continue eliquis 5 mg twice a day.  - Hgb stable 12.6 .  - Received IV iron on 11/14  - Allergic to warfarin.  2. Chronic systolic HF with severe biventricular dysfunction EF 10-15% due to NICM -> Cardiogenic shock. Newly diagnosed 8/16. Echo 09/10/15 with EF 25%. Echo 7/17 EF 25-30%. Refused ICD. - Echo 05/2017 with EF 45-50%, with Grade 1 DD.EF back down in August on this year. ECHO 04/2018 25-30%.  - Echo 07/18/18: EF 10-15%, mild MR, LA and RA mod dilated, RV moderately reduced - Volume status looks good. On oral lasix - CO-OX 64.5%. Stop milrinone.Set up CVP.   Sherryll Burger and coreg on hold with hypotension. SBP 80-90s -Off corlanor with afib. - Failed spiro in past. Had rash.Consider eplerenone. - Continue digoxin 0.125 mg daily  - Continue losartan 12.5mg  qhs. SBP soft. No room to increase.  - No b-blocker yet with low output  3.Mild nonobstructive CAD by cath 04/2015 - Troponin <0.03. Allergic to ASA. Starting eliquis - He is not on a statin. No s/s ischemia.   4. [name] PVCs/NSVT.  - Stop amio drip. Start amio 200 mg twice a day.  - Keep K > 4.0 Mg > 2.0 - Will need to consider LifeVest  5. Anemia - Hemoglobin 12.6.  - Got IV iron 11/14   Length of Stay: 7  Amy Clegg, NP  07/24/2018, 7:30 AM  Advanced Heart Failure Team Pager [phone] (M-F; 7a - 4p)  Please contact [facility] for night-coverage after hours (4p -7a ) and weekends on [email or web address]   Patient seen and examined with the above-signed Advanced Practice Provider and/or Housestaff. I personally reviewed laboratory data, imaging studies and relevant notes. I independently examined the patient and formulated the important aspects of the plan. I have edited the note to reflect any of my changes or salient points. I have personally discussed the plan with the patient and/or family.  Milrinone decreased yesterday. Co-ox 64%. Volume status ok. On low dose losartan. Tolerating well. BP soft.  Will increase losartan to 12.5 bid. Stop milrinone. Switch amio to po. Will discuss LifeVest.  [name], MD  7:45 AM

## 2018-07-25 DIAGNOSIS — I4891 Unspecified atrial fibrillation: Secondary | ICD-10-CM

## 2018-07-25 DIAGNOSIS — R570 Cardiogenic shock: Secondary | ICD-10-CM

## 2018-07-25 LAB — BASIC METABOLIC PANEL
ANION GAP: 8 (ref 5–15)
BUN: 16 mg/dL (ref 8–23)
CO2: 27 mmol/L (ref 22–32)
Calcium: 8.7 mg/dL — ABNORMAL LOW (ref 8.9–10.3)
Chloride: 100 mmol/L (ref 98–111)
Creatinine, Ser: 1.4 mg/dL — ABNORMAL HIGH (ref 0.61–1.24)
GFR calc Af Amer: >60 mL/min (ref >60)
GFR, EST NON AFRICAN AMERICAN: 52 mL/min — AB (ref >60)
Glucose, Bld: 91 mg/dL (ref 70–99)
POTASSIUM: 4.3 mmol/L (ref 3.5–5.1)
SODIUM: 135 mmol/L (ref 135–145)

## 2018-07-25 LAB — CBC
HCT: 37.3 % — ABNORMAL LOW (ref 39.0–52.0)
Hemoglobin: 12.2 g/dL — ABNORMAL LOW (ref 13.0–17.0)
MCH: 30 pg (ref 26.0–34.0)
MCHC: 32.7 g/dL (ref 30.0–36.0)
MCV: 91.9 fL (ref 80.0–100.0)
NRBC: 0 % (ref 0.0–0.2)
PLATELETS: 159 10*3/uL (ref 150–400)
RBC: 4.06 MIL/uL — AB (ref 4.22–5.81)
RDW: 14.8 % (ref 11.5–15.5)
WBC: 5.6 10*3/uL (ref 4.0–10.5)

## 2018-07-25 LAB — MAGNESIUM: MAGNESIUM: 2.2 mg/dL (ref 1.7–2.4)

## 2018-07-25 LAB — DIGOXIN LEVEL: DIGOXIN LVL: 0.3 ng/mL — AB (ref 0.8–2.0)

## 2018-07-25 MED ORDER — LOSARTAN POTASSIUM 25 MG PO TABS
25.0000 mg | ORAL_TABLET | Freq: Two times a day (BID) | ORAL | Status: DC
  Administered 2018-07-25 – 2018-07-26 (×3): 25 mg via ORAL
  Filled 2018-07-25 (×3): qty 1

## 2018-07-25 NOTE — Plan of Care (Signed)
Problem: Elimination: Goal: Will not experience complications related to bowel motility Outcome: Progressing   Problem: Safety: Goal: Ability to remain free from injury will improve Outcome: Progressing   Problem: Activity: Goal: Ability to tolerate increased activity will improve Outcome: Progressing   Problem: Cardiac: Goal: Ability to achieve and maintain adequate cardiopulmonary perfusion will improve Outcome: Progressing   Problem: Health Behavior/Discharge Planning: Goal: Ability to safely manage health-related needs after discharge will improve Outcome: Progressing   Problem: Spiritual Needs Goal: Ability to function at adequate level Outcome: Progressing   Problem: Activity: Goal: Capacity to carry out activities will improve Outcome: Progressing   Problem: Cardiac: Goal: Ability to achieve and maintain adequate cardiopulmonary perfusion will improve Outcome: Progressing

## 2018-07-25 NOTE — Progress Notes (Signed)
Was told in report that patient has been refusing medication.  Went to ask patient if he would be willing to take medication, patient stated that his chest was not tight and he was breathing fine, I explained that medication was a maintenance drug and would benefit him but patient still refused.

## 2018-07-25 NOTE — Progress Notes (Signed)
Advanced Heart Failure Rounding Note  PCP-Cardiologist: No primary care provider on file.   Subjective:    Yesterday milrinone was stopped. Amio changed to po.   CO-OX pending  Feels ok. Had nightmares overnight. No CP or SOB. Multiple brief runs NSVT up to 12 beats,   Echo 07/18/18: EF 10-15%, mild MR, LA and RA mod dilated, RV moderately reduced  Objective:   Weight Range: 70.2 kg Body mass index is 23.19 kg/m.   Vital Signs:   Temp:  [98.1 F (36.7 C)-99.3 F (37.4 C)] 98.7 F (37.1 C) (11/19 0329) Pulse Rate:  [77-93] 77 (11/19 0329) Resp:  [16-20] 20 (11/19 0329) BP: (100-123)/(70-88) 123/80 (11/19 0329) SpO2:  [96 %-97 %] 97 % (11/19 0329) Weight:  [70.2 kg] 70.2 kg (11/19 0318) Last BM Date: 07/19/18  Weight change: Filed Weights   07/23/18 0500 07/24/18 0401 07/25/18 0318  Weight: 70.5 kg 70.8 kg 70.2 kg    Intake/Output:   Intake/Output Summary (Last 24 hours) at 07/25/2018 0622 Last data filed at 07/25/2018 0326 Gross per 24 hour  Intake 513.88 ml  Output 1025 ml  Net -511.12 ml      Physical Exam   General:  Well appearing. No resp difficulty HEENT: normal Neck: supple. no JVD. Carotids 2+ bilat; no bruits. No lymphadenopathy or thryomegaly appreciated. Cor: PMI laterally displaced. Regular rate & rhythm + PVCs Lungs: clear Abdomen: soft, nontender, nondistended. No hepatosplenomegaly. No bruits or masses. Good bowel sounds. Extremities: no cyanosis, clubbing, rash, edema Neuro: alert & orientedx3, cranial nerves grossly intact. moves all 4 extremities w/o difficulty. Affect pleasant    Telemetry   NSR 80-90s with multiple short runs NSVT up to 12 beats   Labs    CBC Recent Labs    07/24/18 0400 07/25/18 0306  WBC 5.5 5.6  HGB 12.6* 12.2*  HCT 39.1 37.3*  MCV 93.1 91.9  PLT 166 159   Basic Metabolic Panel Recent Labs    11/91/47 0400 07/25/18 0306  NA 136 135  K 4.3 4.3  CL 101 100  CO2 27 27  GLUCOSE 93 91  BUN  13 16  CREATININE 1.24 1.40*  CALCIUM 8.8* 8.7*  MG 2.1 2.2   Liver Function Tests Recent Labs    07/24/18 0400  AST 55*  ALT 185*  ALKPHOS 45  BILITOT 0.7  PROT 6.3*  ALBUMIN 3.4*   No results for input(s): LIPASE, AMYLASE in the last 72 hours. Cardiac Enzymes No results for input(s): CKTOTAL, CKMB, CKMBINDEX, TROPONINI in the last 72 hours.  BNP: BNP (last 3 results) Recent Labs    07/17/18 1546  BNP 2,003.9*    ProBNP (last 3 results) No results for input(s): PROBNP in the last 8760 hours.   D-Dimer No results for input(s): DDIMER in the last 72 hours. Hemoglobin A1C No results for input(s): HGBA1C in the last 72 hours. Fasting Lipid Panel No results for input(s): CHOL, HDL, LDLCALC, TRIG, CHOLHDL, LDLDIRECT in the last 72 hours. Thyroid Function Tests No results for input(s): TSH, T4TOTAL, T3FREE, THYROIDAB in the last 72 hours.  Invalid input(s): FREET3  Other results:   Imaging    No results found.   Medications:     Scheduled Medications: . amiodarone  200 mg Oral BID  . apixaban  5 mg Oral BID  . Chlorhexidine Gluconate Cloth  6 each Topical Daily  . digoxin  0.125 mg Oral Daily  . furosemide  40 mg Oral Daily  . iron  polysaccharides  150 mg Oral BID  . losartan  12.5 mg Oral BID  . mometasone-formoterol  2 puff Inhalation BID    Infusions: . ferumoxytol 468 mL/hr at 07/20/18 1500    PRN Medications: acetaminophen, albuterol, docusate sodium, gabapentin, magnesium hydroxide, ondansetron (ZOFRAN) IV, senna, sodium chloride flush, traZODone    Patient Profile   Marcus Bates is a 62 -year-old male with h/o systolic HF due to NICM, mild nonobstructive CAD, PE, LV thrombus, and PAF.   Admitted from HF clinic with SVT > Afib RVR.   Assessment/Plan   1. Paroxymsal A Fib RVR/SVT - Maintaining NSR on amiodarone 200 mg twice a day.  - Continue eliquis 5 mg twice a day.  - Hgb stable 12.2.  - Received IV iron on 11/14  - Allergic to  warfarin.  2. Chronic systolic HF with severe biventricular dysfunction EF 10-15% due to NICM -> Cardiogenic shock. Newly diagnosed 8/16. Echo 09/10/15 with EF 25%. Echo 7/17 EF 25-30%. Refused ICD. - Echo 05/2017 with EF 45-50%, with Grade 1 DD.EF back down in August on this year. ECHO 04/2018 25-30%.  - Echo 07/18/18: EF 10-15%, mild MR, LA and RA mod dilated, RV moderately reduced - Volume status looks good. On oral lasix - Milrinone stopped 11/17. Check co-ox this am.  - Entresto and coreg on hold with hypotension.  - On losartan 12.5 bid SBP improved not 100-110. Increase to 25 bid -Off corlanor with afib. - Failed spiro in past. Had rash.Consider eplerenone. - Continue digoxin 0.125 mg daily  - No b-blocker yet with low output  3.Mild nonobstructive CAD by cath 04/2015 - Troponin <0.03. Allergic to ASA. Starting eliquis - He is not on a statin. No s/s ischemia.   4. [name] PVCs/NSVT.  - Continue amio 200 mg twice a day.  - Keep K > 4.0 Mg > 2.0 - Will order LifeVest today, Possibly home in am if Lifevest ready and co-ox stable  5. Anemia - Hemoglobin 12.6.  - Got IV iron 11/14   Length of Stay: 8  [name], MD  07/25/2018, 6:22 AM  Advanced Heart Failure Team Pager [phone] (M-F; 7a - 4p)  Please contact [facility] for night-coverage after hours (4p -7a ) and weekends on [email or web address]

## 2018-07-25 NOTE — Plan of Care (Signed)
  Problem: Clinical Measurements: Goal: Ability to maintain clinical measurements within normal limits will improve Outcome: Progressing Goal: Will remain free from infection Outcome: Progressing Goal: Diagnostic test results will improve Outcome: Progressing Goal: Cardiovascular complication will be avoided Outcome: Progressing   Problem: Elimination: Goal: Will not experience complications related to bowel motility Outcome: Progressing   Problem: Cardiac: Goal: Ability to achieve and maintain adequate cardiopulmonary perfusion will improve Outcome: Progressing   Problem: Health Behavior/Discharge Planning: Goal: Ability to safely manage health-related needs after discharge will improve Outcome: Progressing   Problem: Spiritual Needs Goal: Ability to function at adequate level Outcome: Progressing   Problem: Activity: Goal: Capacity to carry out activities will improve Outcome: Progressing   Problem: Cardiac: Goal: Ability to achieve and maintain adequate cardiopulmonary perfusion will improve Outcome: Progressing

## 2018-07-25 NOTE — Discharge Summary (Addendum)
Advanced Heart Failure Discharge Note  Discharge Summary   Patient ID: Coleson Kant MRN: [id number], DOB/AGE: Apr 14, 1956 62 y.o. Admit date: 07/17/2018 D/C date:     07/26/2018    Primary Discharge Diagnoses:  1. Paroxysmal afib RVR/SVT 2. A/C biventricular HF due to NICM > cardiogenic shock 3. Mild nonobstructive CAD 4. Frequent PVCs/NSVT 5. Anemia  Hospital Course:  Needham Biggins is a 62 y.o. male with h/o systolic HF due to NICM, mild nonobstructive CAD, PE, LV thrombus, and PAF.   He was admitted from HF clinic 07/17/2018. He presented with SVT 190s with little response to adenosine and then converted into afib RVR 170-190. He was started on amiodarone and heparin drips. Echo showed EF 10-15% with RV dysfunction. A central line was placed and showed low coox. He was started on milrinone and norepinephrine.   Amiodarone held briefly with concern for beta blockade making shock worse. He went back into SVT 190s 11/12 pm. Amio drip was restarted. He chemically converted to NSR with frequent PVCs. He transitioned to PO amiodarone after appropriate timeframe. Heparin was switched to Eliquis. He was provided with elquis co-pay care.   He was diuresed with IV lasix and transitioned to 80 mg PO lasix daily. Milrinone was slowly weaned. Coox remained stable. HF meds titrated as able. Corlanor stopped on admit with afib. No BB with low output. He is allergic to spiro.   Iron studies were low. He received IV iron on 11/14. He had no s/s bleeding. Hemoglobin monitored and remained stable.   He had several runs of NSVT. Electrolytes monitored closely and replaced when needed. Life Vest was recommended but he refused.    He was taking adderall prior to admit and says he took it because gabapentin made him sluggish.  He was instructed to stop adderall with reduced EF. He verbalized understanding.   He will be followed closely in HF clinic, with appointment as below. He will have EKG and BMET at  follow up.   Discharge Weight: 151 pounds.   Discharge Vitals: Blood pressure 120/68, pulse 70, temperature 98.2 F (36.8 C), temperature source Oral, resp. rate 20, height 5' 8.5" (1.74 m), weight 68.6 kg, SpO2 93 %.  Labs: Lab Results  Component Value Date   WBC 5.6 07/25/2018   HGB 12.2 (L) 07/25/2018   HCT 37.3 (L) 07/25/2018   MCV 91.9 07/25/2018   PLT 159 07/25/2018    Recent Labs  Lab 07/24/18 0400  07/26/18 0500  NA 136   < > 135  K 4.3   < > 4.5  CL 101   < > 102  CO2 27   < > 26  BUN 13   < > 15  CREATININE 1.24   < > 1.23  CALCIUM 8.8*   < > 9.0  PROT 6.3*  --   --   BILITOT 0.7  --   --   ALKPHOS 45  --   --   ALT 185*  --   --   AST 55*  --   --   GLUCOSE 93   < > 84   < > = values in this interval not displayed.   Lab Results  Component Value Date   CHOL 95 05/02/2015   HDL 30 (L) 05/02/2015   LDLCALC 56 05/02/2015   TRIG 44 05/02/2015   BNP (last 3 results) Recent Labs    07/17/18 1546  BNP 2,003.9*    ProBNP (last 3 results) No results for  input(s): PROBNP in the last 8760 hours.   Diagnostic Studies/Procedures   Echo 07/18/18: - Left ventricle: The cavity size was severely dilated. Wall   thickness was normal. Systolic function was severely reduced. The   estimated ejection fraction was in the range of 10% to 15%.   Severe diffuse hypokinesis with no identifiable regional   variations. No evidence of thrombus. - Aortic valve: Valve area (VTI): 3.12 cm^2. Valve area (Vmax):   3.31 cm^2. Valve area (Vmean): 3.36 cm^2. - Mitral valve: There was mild regurgitation. - Left atrium: The atrium was moderately dilated. - Right ventricle: The cavity size was moderately dilated. Systolic   function was moderately reduced. - Right atrium: The atrium was moderately dilated.  Discharge Medications   Allergies as of 07/26/2018      Reactions   Spironolactone Rash   Aspirin    REACTION: facial swelling   Dust Mite Extract    Other    Cats    Peanuts [peanut Oil]    sob   Warfarin And Related Rash      Medication List    STOP taking these medications   amphetamine-dextroamphetamine 10 MG tablet Commonly known as:  ADDERALL   carvedilol 3.125 MG tablet Commonly known as:  COREG   ENTRESTO 49-51 MG Generic drug:  sacubitril-valsartan Replaced by:  sacubitril-valsartan 24-26 MG   ivabradine 5 MG Tabs tablet Commonly known as:  CORLANOR     TAKE these medications   ACETAMINOPHEN PO Take 1,300 mg by mouth at bedtime.   albuterol 108 (90 Base) MCG/ACT inhaler Commonly known as:  PROVENTIL HFA;VENTOLIN HFA Inhale 2 puffs into the lungs every 4 (four) hours as needed for wheezing or shortness of breath.   amiodarone 200 MG tablet Commonly known as:  PACERONE Take 1 tablet (200 mg total) by mouth 2 (two) times daily.   apixaban 5 MG Tabs tablet Commonly known as:  ELIQUIS Take 1 tablet (5 mg total) by mouth 2 (two) times daily.   digoxin 0.125 MG tablet Commonly known as:  LANOXIN Take 1 tablet (0.125 mg total) by mouth daily. Start taking on:  07/27/2018   Fish Oil 1000 MG Caps Take 1,000 mg by mouth daily. Reported on 11/10/2015   Fluticasone-Salmeterol 250-50 MCG/DOSE Aepb Commonly known as:  ADVAIR Inhale 1 puff into the lungs 2 (two) times daily as needed (SOB, wheezing). Reported on 11/10/2015   gabapentin 300 MG capsule Commonly known as:  NEURONTIN Take 300 mg by mouth at bedtime.   Guarana 1000 MG Tabs Take 1,000 mg by mouth daily.   iron polysaccharides 150 MG capsule Commonly known as:  NIFEREX Take 150 mg by mouth 2 (two) times daily.   magnesium oxide 400 MG tablet Commonly known as:  MAG-OX Take 200 mg by mouth daily.   multivitamin with minerals tablet Take 1 tablet by mouth daily.   PASSION FLOWER-VALERIAN PO Take 1 tablet by mouth daily.   sacubitril-valsartan 24-26 MG Commonly known as:  ENTRESTO Take 1 tablet by mouth 2 (two) times daily. Replaces:  ENTRESTO 49-51 MG     vitamin A 40981 UNIT capsule Take 25,000 Units by mouth every other day.   vitamin E 100 UNIT capsule Take 100 Units by mouth every other day.            Durable Medical Equipment  (From admission, onward)         Start     Ordered   07/25/18 0835  For home use only  DME Vest life vest  Once    Comments:  Home 07/26/2018   07/25/18 0834          Disposition   The patient will be discharged in stable condition to home. Discharge Instructions    (HEART FAILURE PATIENTS) Call MD:  Anytime you have any of the following symptoms: 1) 3 pound weight gain in 24 hours or 5 pounds in 1 week 2) shortness of breath, with or without a dry hacking cough 3) swelling in the hands, feet or stomach 4) if you have to sleep on extra pillows at night in order to breathe.   Complete by:  As directed    Diet - low sodium heart healthy   Complete by:  As directed    Heart Failure patients record your daily weight using the same scale at the same time of day   Complete by:  As directed    Increase activity slowly   Complete by:  As directed      Follow-up Information    Alford Highland, NP Follow up on 08/01/2018.   Specialty:  Cardiology Why:  10 am. Garage code 1800 for November Contact information: [address] [place] [address] [phone]             Duration of Discharge Encounter: Greater than 35 minutes   Signed, Tonye Becket, NP 07/26/2018, 10:32 AM   Patient seen and examined with Tonye Becket, NP. We discussed all aspects of the encounter. I agree with the assessment and plan as stated above.   He is stable for d/c. Long discussion about LifeVest and he refuses. Aware of risk for SCD. Will follow closely in HF Clinic.   [name], MD  11:38 PM

## 2018-07-25 NOTE — Care Management Note (Addendum)
Case Management Note  Patient Details  Name: Marcus Bates MRN: [id number] Date of Birth: 10-12-1955  Subjective/Objective:   Pt admitted with HF                 Action/Plan:  PTA independent from home.  Pt informed CM that he has been on Xarelto previously and have already utilized free 30 day card.  Pt informed CM that he was able to use the reduced copay card in the past.  CM informed pt of benefit check results and CM gave him another reduced copay card   Expected Discharge Date:                  Expected Discharge Plan:  Home/Self Care  In-House Referral:     Discharge planning Services  CM Consult  Post Acute Care Choice:    Choice offered to:  Patient  DME Arranged:  Other see comment(Life Vest) DME Agency:  ([facility])  HH Arranged:    HH Agency:  [facility]  Status of Service:  In process, will continue to follow  If discussed at Long Length of Stay Meetings, dates discussed:    Additional Comments: Pt will discharge home on Life Vest.  [facility] liaison confirmed that all necessary forms are completed and dme is now in process for insurance approval Cherylann Parr, RN 07/25/2018, 3:28 PM

## 2018-07-26 LAB — BASIC METABOLIC PANEL
Anion gap: 7 (ref 5–15)
BUN: 15 mg/dL (ref 8–23)
CALCIUM: 9 mg/dL (ref 8.9–10.3)
CO2: 26 mmol/L (ref 22–32)
CREATININE: 1.23 mg/dL (ref 0.61–1.24)
Chloride: 102 mmol/L (ref 98–111)
Glucose, Bld: 84 mg/dL (ref 70–99)
Potassium: 4.5 mmol/L (ref 3.5–5.1)
SODIUM: 135 mmol/L (ref 135–145)

## 2018-07-26 LAB — COOXEMETRY PANEL
CARBOXYHEMOGLOBIN: 1.5 % (ref 0.5–1.5)
METHEMOGLOBIN: 1.7 % — AB (ref 0.0–1.5)
O2 Saturation: 77.9 %
Total hemoglobin: 13.1 g/dL (ref 12.0–16.0)

## 2018-07-26 LAB — MAGNESIUM: Magnesium: 2.3 mg/dL (ref 1.7–2.4)

## 2018-07-26 MED ORDER — SACUBITRIL-VALSARTAN 24-26 MG PO TABS: 1.0000 | ORAL_TABLET | Freq: Two times a day (BID) | ORAL | 6 refills | Status: DC

## 2018-07-26 MED ORDER — DIGOXIN 125 MCG PO TABS: 0.1250 mg | ORAL_TABLET | Freq: Every day | ORAL | 6 refills | Status: DC

## 2018-07-26 MED ORDER — APIXABAN 5 MG PO TABS: 5.0000 mg | ORAL_TABLET | Freq: Two times a day (BID) | ORAL | 6 refills | Status: DC

## 2018-07-26 MED ORDER — SACUBITRIL-VALSARTAN 24-26 MG PO TABS: 1.0000 | ORAL_TABLET | Freq: Two times a day (BID) | ORAL | Status: DC

## 2018-07-26 MED ORDER — AMIODARONE HCL 200 MG PO TABS: 200.0000 mg | ORAL_TABLET | Freq: Two times a day (BID) | ORAL | 6 refills | Status: DC

## 2018-07-26 MED FILL — ELIQUIS 5 MG TABLET: 5 | 30 days supply | Qty: 60 | Fill #0

## 2018-07-26 MED FILL — ENTRESTO 24 MG-26 MG TABLET: 24-26 | 30 days supply | Qty: 60 | Fill #0

## 2018-07-26 MED FILL — AMIODARONE HCL 200 MG TAB: 200 | 30 days supply | Qty: 60 | Fill #0

## 2018-07-26 MED FILL — DIGOXIN 0.125 MG TABLET: 125 | 30 days supply | Qty: 30 | Fill #0

## 2018-07-26 NOTE — Discharge Instructions (Signed)

## 2018-07-26 NOTE — Care Management Note (Signed)
Case Management Note  Patient Details  Name: Marcus Bates MRN: [id number] Date of Birth: 09-19-1955  Subjective/Objective:   Pt admitted with HF                 Action/Plan:  PTA independent from home.  Pt informed CM that he has been on Xarelto previously and have already utilized free 30 day card.  Pt informed CM that he was able to use the reduced copay card in the past.  CM informed pt of benefit check results and CM gave him another reduced copay card   Expected Discharge Date:  07/26/18               Expected Discharge Plan:  Home/Self Care  In-House Referral:     Discharge planning Services  CM Consult  Post Acute Care Choice:    Choice offered to:  Patient  DME Arranged:  Other see comment(Life Vest) DME Agency:  ([facility])  HH Arranged:    HH Agency:  [facility]  Status of Service:  In process, will continue to follow  If discussed at Long Length of Stay Meetings, dates discussed:    Additional Comments: 07/26/2018 Pt deemed appropriate for discharge - pt declined life vest - attending aware.  TOC to deliver meds to pts bedside.  Pt has been on Entresto and Eliquis in the past and is knowledgeable about utilizing the reduced copay cards.    07/25/18 Pt will discharge home on Life Vest.  [facility] liaison confirmed that all necessary forms are completed and dme is now in process for insurance approval Cherylann Parr, RN 07/26/2018, 12:06 PM

## 2018-07-26 NOTE — Progress Notes (Addendum)
Advanced Heart Failure Rounding Note  PCP-Cardiologist: No primary care provider on file.   Subjective:    CO-OX 78%. Off milrinone.   Wants to go home. Denies SOB.    Objective:   Weight Range: 68.6 kg Body mass index is 22.66 kg/m.   Vital Signs:   Temp:  [97.8 F (36.6 C)-98.7 F (37.1 C)] 98.2 F (36.8 C) (11/20 0800) Pulse Rate:  [70-79] 70 (11/20 0800) Resp:  [13-22] 20 (11/20 0800) BP: (89-120)/(61-92) 120/68 (11/20 0800) SpO2:  [93 %-100 %] 93 % (11/20 0800) Weight:  [68.6 kg] 68.6 kg (11/20 0500) Last BM Date: 07/25/18  Weight change: Filed Weights   07/25/18 0318 07/26/18 0039 07/26/18 0500  Weight: 70.2 kg 68.6 kg 68.6 kg    Intake/Output:   Intake/Output Summary (Last 24 hours) at 07/26/2018 0806 Last data filed at 07/26/2018 0348 Gross per 24 hour  Intake 640 ml  Output 1525 ml  Net -885 ml      Physical Exam   CVP 6.  General:  Well appearing. No resp difficulty. Sitting of the side of the bed.  HEENT: normal Neck: supple. no JVD. Carotids 2+ bilat; no bruits. No lymphadenopathy or thryomegaly appreciated. Cor: PMI nondisplaced. Regular rate & rhythm. No rubs, gallops or murmurs. Lungs: clear Abdomen: soft, nontender, nondistended. No hepatosplenomegaly. No bruits or masses. Good bowel sounds. Extremities: no cyanosis, clubbing, rash, edema Neuro: alert & orientedx3, cranial nerves grossly intact. moves all 4 extremities w/o difficulty. Affect pleasant    Telemetry   NSR 80-90s with PVCs and NSVT  Labs    CBC Recent Labs    07/24/18 0400 07/25/18 0306  WBC 5.5 5.6  HGB 12.6* 12.2*  HCT 39.1 37.3*  MCV 93.1 91.9  PLT 166 159   Basic Metabolic Panel Recent Labs    38/17/71 0306 07/26/18 0500  NA 135 135  K 4.3 4.5  CL 100 102  CO2 27 26  GLUCOSE 91 84  BUN 16 15  CREATININE 1.40* 1.23  CALCIUM 8.7* 9.0  MG 2.2 2.3   Liver Function Tests Recent Labs    07/24/18 0400  AST 55*  ALT 185*  ALKPHOS 45    BILITOT 0.7  PROT 6.3*  ALBUMIN 3.4*   No results for input(s): LIPASE, AMYLASE in the last 72 hours. Cardiac Enzymes No results for input(s): CKTOTAL, CKMB, CKMBINDEX, TROPONINI in the last 72 hours.  BNP: BNP (last 3 results) Recent Labs    07/17/18 1546  BNP 2,003.9*    ProBNP (last 3 results) No results for input(s): PROBNP in the last 8760 hours.   D-Dimer No results for input(s): DDIMER in the last 72 hours. Hemoglobin A1C No results for input(s): HGBA1C in the last 72 hours. Fasting Lipid Panel No results for input(s): CHOL, HDL, LDLCALC, TRIG, CHOLHDL, LDLDIRECT in the last 72 hours. Thyroid Function Tests No results for input(s): TSH, T4TOTAL, T3FREE, THYROIDAB in the last 72 hours.  Invalid input(s): FREET3  Other results:   Imaging    No results found.   Medications:     Scheduled Medications: . amiodarone  200 mg Oral BID  . apixaban  5 mg Oral BID  . digoxin  0.125 mg Oral Daily  . furosemide  40 mg Oral Daily  . iron polysaccharides  150 mg Oral BID  . losartan  25 mg Oral BID  . mometasone-formoterol  2 puff Inhalation BID    Infusions: . ferumoxytol 468 mL/hr at 07/20/18 1500  PRN Medications: acetaminophen, albuterol, docusate sodium, gabapentin, magnesium hydroxide, ondansetron (ZOFRAN) IV, senna, sodium chloride flush, traZODone    Patient Profile   Marcus Bates is a 62 -year-old male with h/o systolic HF due to NICM, mild nonobstructive CAD, PE, LV thrombus, and PAF.   Admitted from HF clinic with SVT > Afib RVR.   Assessment/Plan   1. Paroxymsal A Fib RVR/SVT - Maintaining NSR on amiodarone 200 mg twice a day.  - Continue eliquis 5 mg twice a day. Eliquis CO-Pay card at bedside.  - Received IV iron on 11/14  - Allergic to warfarin.  2. Chronic systolic HF with severe biventricular dysfunction EF 10-15% due to NICM -> Cardiogenic shock. Newly diagnosed 8/16. Echo 09/10/15 with EF 25%. Echo 7/17 EF 25-30%. Refused ICD. - Echo  05/2017 with EF 45-50%, with Grade 1 DD.EF back down in August on this year. ECHO 04/2018 25-30%.  - Echo 07/18/18: EF 10-15%, mild MR, LA and RA mod dilated, RV moderately reduced - Volume status stable.  - Stop po lasix. Stop losartan.  - Start  entresto 24-26 mg twice a day.  - Off corlanor with afib. - Failed spiro in past. Had rash.Consider eplerenone. - Continue digoxin 0.125 mg daily  - No b-blocker yet with low output  3.Mild nonobstructive CAD by cath 04/2015 - Troponin <0.03. Allergic to ASA. Starting eliquis - He is not on a statin. No s/s ischemia.   4. Marcus Bates PVCs/NSVT.  - Continue amio 200 mg twice a day.  - Keep K > 4.0 Mg > 2.0 - Place Life Vest today.   5. Anemia - CBC pending.  - Got IV iron 11/14  HF follow up set up. Will need to Life Vest prior to discharge. Waiting on approval.  He has eliquis co-pay card.    Length of Stay: 9  Marcus Clegg, NP  07/26/2018, 8:06 AM  Advanced Heart Failure Team Pager [phone] (M-F; 7a - 4p)  Please contact [facility] for night-coverage after hours (4p -7a ) and weekends on [email or web address]  Patient seen and examined with the above-signed Advanced Practice Provider and/or Housestaff. I personally reviewed laboratory data, imaging studies and relevant notes. I independently examined the patient and formulated the important aspects of the plan. I have edited the note to reflect any of my changes or salient points. I have personally discussed the plan with the patient and/or family.  Feeling better. Remains in NSR on po amio. Co-ox looks good. Long discussion about possible LifeVest and he has chosen not to proceed with it. He is aware of risk of SCD.   Ok for d/c today. Will see back in clinic next week.   Marcus Meres, MD  11:46 AM

## 2018-07-26 NOTE — Progress Notes (Signed)
RN reviewed paperwork and medications with patient, patient states he has no questions. New medications were brought to room by pharmacist- RN reviewed medication bottles to be sure all new medications were included. Patient waiting on ride, will be discharged home with friend. Belongings packed and sent with patient.

## 2018-07-26 NOTE — Progress Notes (Signed)
   07/26/18 1100  Clinical Encounter Type  Visited With Patient  Visit Type Follow-up  Responded to [facility] consult for update AD. Patient was sitting up in bed and he states that he want to update is AD that was prepared in 01-24-2015. His father since has died and he had a few additions he wanted clearly expressed in writing on the form. New AD was signed, notarized and copy given to unit clerk and patient got the original. Patient was very appreciative for this service being provided. Patient is looking forward to being discharged later today.

## 2018-07-31 NOTE — Progress Notes (Signed)
Patient ID: Marcus Bates, male   DOB: 02-Dec-1955, 62 y.o.   MRN: [id number]    Advanced Heart Failure Clinic Note   PCP: [facility] Mole Primary Cardiologist: Bensimhon  HPI: Marcus Bates is a 62 y.o. with no significant PMHx who was admitted in August 2016 for acute systolic HF complicated by cardiogenic shock and RLL PE. He also has a history of LV thrombus, PAF, allergic to warfarin, and sleep disturbances.   Echo 05/14/15 showed EF 10-15% with grade 3 diastolic dysfunction and LV mural thrombus. Severe RV dysfunction.  Cardiac cath 05/02/15 showed minimal nonobstructive CAD. RHC cath on 8/31 c/w low output. Was treated with milrinone which was eventually weaned off. HF med titration limited by low BP.  He developed a drug rash in the hospital. Initially thought to be due to spironolactone but persisted and then felt to be related to warfarin, transitioned to Eliquis. Discharge weight 141 pounds.  He was admitted 11/11-11/20/19 from HF clinic with SVT, then afib RVR 170-190s. He required amiodarone drip, then transitioned to PO after appropriate time. Repeat echo showed EF 10-15% with RV dysfunction. Central line placed with concerns for low output. Coox was low and he was started on milrinone and norepinephrine. He chemically converted into NSR on amio. Heparin transitioned to Eliquis. He was diuresed with IV lasix, but did not require lasix at DC (started on [place]). He received IV iron 11/14. Recommended LifeVest at DC due to frequent NSVT, but he refused. HF meds titrated as able. DC weight: 151 lbs.  Today he returns for post hospital follow up. Overall doing well. He is SOB after walking 1/4 mile. Able to walk around the grocery store with no problems or breaks. Denied edema, orthopnea, PND, or cough. No CP, dizziness, or palpitations. No bleeding on Eliquis. Appetite is great. Having some constipation. Main complaint is insomnia, which he thinks is related to amiodarone. Asking for trazadone, which he  took in the hospital. Also wants to know when he can go back to work. 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 No heavy lifting. Taking all medications. Has not been weighing, but he does have a scale. Weight is up 5 lbs on our scale.  Echo 07/18/18: EF 10-15%, mild MR, LA and RA mod dilated, RV moderately reduced ECHO 2019 EF 25-30%  Echo 05/2017 with EF 45-50%, with Grade 1 DD.  ECHO 03/08/2016: No evidence of LV thrombus. EF 30-35%. RV ok.  RHC 05/07/2015 RA = 9 RV = 39/5/12 PA = 41/20 (28) PCW = 18 Fick cardiac output/index = 2.6/1.4 Therms cardiac output/index = 3.6/2.0 PVR = 3.8 WU FA sat = 98% PA sat = 54%, 57  Cardiac MRI 05/05/15 1. Severely dilated left and right ventricle with severe biventricular systolic impairment. LVEF 13%  Diffuse mid wall late gadolinium enhancement in the myocardium of the left and right ventricle and in the RV attachment points to the LV consistent with dilated cardiomyopathy with biventricular involvement and decompensated heart failure. There is no evidence of inflammatory or infiltrative cardiomyopathy. 2. Severely dilated left atrium and moderately dilated right atrium. 3. Severe mitral and moderate tricuspid regurgitation. 4. Dilated main pulmonary artery consistent with pulmonary hypertension. There is severe swirling of the blood in the right ventricle suspicious of the pre-thrombotic state.  Review of systems complete and found to be negative unless listed in HPI.   SH:  Social History   Socioeconomic History  . Marital status: Single    Spouse  name: Not on file  . Number of children: Not on file  . Years of education: Not on file  . Highest education level: Not on file  Occupational History    Comment: Tools  Social Needs  . Financial resource strain: Not on file  . Food insecurity:    Worry: Not on file    Inability: Not on file  . Transportation needs:    Medical: Not on file    Non-medical: Not on  file  Tobacco Use  . Smoking status: Never Smoker  . Smokeless tobacco: Never Used  Substance and Sexual Activity  . Alcohol use: No    Alcohol/week: 0.0 standard drinks  . Drug use: No  . Sexual activity: Not on file  Lifestyle  . Physical activity:    Days per week: Not on file    Minutes per session: Not on file  . Stress: Not on file  Relationships  . Social connections:    Talks on phone: Not on file    Gets together: Not on file    Attends religious service: Not on file    Active member of club or organization: Not on file    Attends meetings of clubs or organizations: Not on file    Relationship status: Not on file  . Intimate partner violence:    Fear of current or ex partner: Not on file    Emotionally abused: Not on file    Physically abused: Not on file    Forced sexual activity: Not on file  Other Topics Concern  . Not on file  Social History Narrative  . Not on file    FH:  Family History  Problem Relation Age of Onset  . Heart disease Other   . CAD Maternal Grandfather        MI in his 65s maybe?  . Stroke Paternal Grandfather   . CAD Maternal Aunt        bypass at 34  . Pulmonary embolism Neg Hx     Past Medical History:  Diagnosis Date  . Acute deep vein thrombosis (DVT) of popliteal vein of right lower extremity (HCC)    a. 04/2015 -> Xarelto  . Anxiety   . Asthma   . Chronic combined systolic and diastolic CHF (congestive heart failure) (HCC)    a. 04/2015 Echo: EF 10-15%, sev diff HK, Gr 3 DD, sev MR, sev dil LA, dilated RV, mod dil RA.  . Depression   . Insomnia   . LV (left ventricular) mural thrombus    a. 04/2015 Echo: possible laminated echodense area on inferior wall, cannot exclude laminated thrombus.  Marland Kitchen NICM (nonischemic cardiomyopathy) (HCC)    a. 04/2015 Echo: EF 10-15%, sev diff HK.  . Non-obstructive CAD    a. 04/2015 Cath: LM nl, LAD 43m, LCX 20ost, RCA 20d.  Marland Kitchen NSVT (nonsustained ventricular tachycardia) (HCC)   . Pulmonary  embolism on right (HCC)    a. 04/2015 -> Xarelto  . Restless leg syndrome   . Severe mitral insufficiency    a. 04/2015 Echo: Sev MR.    Current Outpatient Medications  Medication Sig Dispense Refill  . ACETAMINOPHEN PO Take 1,300 mg by mouth at bedtime.     Marland Kitchen albuterol (PROVENTIL HFA;VENTOLIN HFA) 108 (90 BASE) MCG/ACT inhaler Inhale 2 puffs into the lungs every 4 (four) hours as needed for wheezing or shortness of breath. 1 Inhaler 2  . amiodarone (PACERONE) 200 MG tablet Take 1 tablet (200 mg total)  by mouth 2 (two) times daily. 60 tablet 6  . apixaban (ELIQUIS) 5 MG TABS tablet Take 1 tablet (5 mg total) by mouth 2 (two) times daily. 60 tablet 6  . digoxin (LANOXIN) 0.125 MG tablet Take 1 tablet (0.125 mg total) by mouth daily. 30 tablet 6  . Fluticasone-Salmeterol (ADVAIR) 250-50 MCG/DOSE AEPB Inhale 1 puff into the lungs 2 (two) times daily as needed (SOB, wheezing). Reported on 11/10/2015    . gabapentin (NEURONTIN) 300 MG capsule Take 300 mg by mouth at bedtime.     . Guarana 1000 MG TABS Take 1,000 mg by mouth daily.    . iron polysaccharides (NIFEREX) 150 MG capsule Take 150 mg by mouth 2 (two) times daily.     . magnesium oxide (MAG-OX) 400 MG tablet Take 200 mg by mouth daily.     . Multiple Vitamins-Minerals (MULTIVITAMIN WITH MINERALS) tablet Take 1 tablet by mouth daily.    . Omega-3 Fatty Acids (FISH OIL) 1000 MG CAPS Take 1,000 mg by mouth daily. Reported on 11/10/2015    . PASSION FLOWER-VALERIAN PO Take 1 tablet by mouth daily.    . sacubitril-valsartan (ENTRESTO) 24-26 MG Take 1 tablet by mouth 2 (two) times daily. 60 tablet 6  . vitamin A 28003 UNIT capsule Take 25,000 Units by mouth every other day.     . vitamin E 100 UNIT capsule Take 100 Units by mouth every other day.      No current facility-administered medications for this encounter.     Vitals:   08/01/18 1019  BP: (!) 142/78  Pulse: 72  SpO2: 98%  Weight: 70.9 kg (156 lb 6.4 oz)    Wt Readings from Last 3  Encounters:  08/01/18 70.9 kg (156 lb 6.4 oz)  07/26/18 68.6 kg (151 lb 3.8 oz)  07/17/18 73.7 kg (162 lb 6.4 oz)    PHYSICAL EXAM: General: No resp difficulty. HEENT: Normal Neck: Supple. JVP 5-6. Carotids 2+ bilat; no bruits. No thyromegaly or nodule noted. Cor: PMI nondisplaced. RRR, No M/G/R noted Lungs: diminished, clear throughout.  Abdomen: Soft, non-tender, non-distended, no HSM. No bruits or masses. +BS  Extremities: No cyanosis, clubbing, or rash. R and LLE no edema.  Neuro: Alert & orientedx3, cranial nerves grossly intact. moves all 4 extremities w/o difficulty. Affect pleasant  EKG: NSR 67 bpm. Personally reviewed.   ASSESSMENT & PLAN: 1.ParoxymsalA Fib RVR/SVT - EKG today shows NSR - Continue amiodarone 200 mg twice a day. Keep at BID for at least 2 months per Dr Gala Romney.  - Discussed need for eye appointments while on amiodarone. Check TFTs and LFTs today.  - Continue eliquis 5 mg twice a day. Denies bleeding.  - Received IV iron on 11/14  - Allergic to warfarin.  2. Chronic systolic HF with severe biventricular dysfunction EF 10-15% due to NICM -> Cardiogenic shock. Newly diagnosed 8/16. Echo 09/10/15 with EF 25%. Echo 7/17 EF 25-30%. Refused ICD and LifeVest - Echo 05/2017 with EF 45-50%, with Grade 1 DD.EF back down in August on this year. ECHO 04/2018 25-30%.  - Echo 07/18/18: EF 10-15%, mild MR, LA and RA mod dilated, RV moderately reduced - Volume status stable on exam.  - Increase entresto to 49/51 mg twice a day. He has tolerated this dose in the past. BMET today and in 2 weeks at follow up.  - Off corlanor with afib. - Failed spiro in past. Had rash.Consider eplerenone. - Continue digoxin 0.125 mg daily. Dig level 0.3 07/2018 -  No b-blocker yet with low output - Will need repeat Echo in 3-4 months. Schedule next visit.  - Keep out of work for 4 weeks per Dr Gala Romney. Note provided.  - We discussed LifeVest again today, but he is not interested.    3.Mild nonobstructive CAD by cath 04/2015 - Continue eliquis. - No s/s ischemia.   4. [name] PVCs/NSVT.  - Continue amio 200 mg twice a day. Keep at this dose for at least 2 months per Dr Gala Romney.  - Check K today.   5. Anemia - Got IV iron 11/14  6. Hx of LV thrombus and PE - Back on Eliquis. Denies bleeding.  7. MR - Mild on recent echo  8. Insomnia - Provided 1 month prescription of trazadone. He needs to reestablish with PCP.  CMET, TFTs today Increase Entresto to 49/51 mg BID.  BMET 2 weeks at follow up Schedule follow up with Dr Gala Romney soonest available.  Provided work note to keep him out of work for at least 4 weeks per Dr Gala Romney.   Alford Highland, NP  10:25 AM   Greater than 50% of the 25 minute visit was spent in counseling/coordination of care regarding disease state education, salt/fluid restriction, sliding scale diuretics, and medication compliance.

## 2018-08-01 ENCOUNTER — Ambulatory Visit (HOSPITAL_COMMUNITY)
Admit: 2018-08-01 | Discharge: 2018-08-01 | Disposition: A | Discharge status: Home / Self Care | Payer: BLUE CROSS/BLUE SHIELD | Attending: Cardiology | Admitting: Cardiology

## 2018-08-01 ENCOUNTER — Encounter (HOSPITAL_COMMUNITY): Payer: Self-pay | Admitting: Cardiology

## 2018-08-01 ENCOUNTER — Encounter (HOSPITAL_COMMUNITY): Payer: Self-pay

## 2018-08-01 VITALS — BP 142/78 | HR 72 | Wt 156.4 lb

## 2018-08-01 DIAGNOSIS — F419 Anxiety disorder, unspecified: Secondary | ICD-10-CM | POA: Insufficient documentation

## 2018-08-01 DIAGNOSIS — Z86711 Personal history of pulmonary embolism: Secondary | ICD-10-CM | POA: Insufficient documentation

## 2018-08-01 DIAGNOSIS — Z79899 Other long term (current) drug therapy: Secondary | ICD-10-CM | POA: Diagnosis not present

## 2018-08-01 DIAGNOSIS — I5022 Chronic systolic (congestive) heart failure: Secondary | ICD-10-CM | POA: Diagnosis not present

## 2018-08-01 DIAGNOSIS — I252 Old myocardial infarction: Secondary | ICD-10-CM | POA: Diagnosis not present

## 2018-08-01 DIAGNOSIS — I5082 Biventricular heart failure: Secondary | ICD-10-CM | POA: Diagnosis not present

## 2018-08-01 DIAGNOSIS — I5042 Chronic combined systolic (congestive) and diastolic (congestive) heart failure: Secondary | ICD-10-CM | POA: Diagnosis not present

## 2018-08-01 DIAGNOSIS — J45909 Unspecified asthma, uncomplicated: Secondary | ICD-10-CM | POA: Insufficient documentation

## 2018-08-01 DIAGNOSIS — I480 Paroxysmal atrial fibrillation: Secondary | ICD-10-CM

## 2018-08-01 DIAGNOSIS — D649 Anemia, unspecified: Secondary | ICD-10-CM | POA: Diagnosis not present

## 2018-08-01 DIAGNOSIS — F329 Major depressive disorder, single episode, unspecified: Secondary | ICD-10-CM | POA: Diagnosis not present

## 2018-08-01 DIAGNOSIS — Z7951 Long term (current) use of inhaled steroids: Secondary | ICD-10-CM | POA: Diagnosis not present

## 2018-08-01 DIAGNOSIS — Z8249 Family history of ischemic heart disease and other diseases of the circulatory system: Secondary | ICD-10-CM | POA: Diagnosis not present

## 2018-08-01 DIAGNOSIS — I428 Other cardiomyopathies: Secondary | ICD-10-CM | POA: Diagnosis not present

## 2018-08-01 DIAGNOSIS — G4700 Insomnia, unspecified: Secondary | ICD-10-CM | POA: Diagnosis not present

## 2018-08-01 DIAGNOSIS — I340 Nonrheumatic mitral (valve) insufficiency: Secondary | ICD-10-CM | POA: Diagnosis not present

## 2018-08-01 DIAGNOSIS — I2510 Atherosclerotic heart disease of native coronary artery without angina pectoris: Secondary | ICD-10-CM | POA: Diagnosis not present

## 2018-08-01 DIAGNOSIS — I471 Supraventricular tachycardia: Secondary | ICD-10-CM | POA: Insufficient documentation

## 2018-08-01 DIAGNOSIS — Z86718 Personal history of other venous thrombosis and embolism: Secondary | ICD-10-CM | POA: Diagnosis not present

## 2018-08-01 DIAGNOSIS — G2581 Restless legs syndrome: Secondary | ICD-10-CM | POA: Insufficient documentation

## 2018-08-01 DIAGNOSIS — Z7901 Long term (current) use of anticoagulants: Secondary | ICD-10-CM | POA: Diagnosis not present

## 2018-08-01 LAB — COMPREHENSIVE METABOLIC PANEL
ALBUMIN: 4 g/dL (ref 3.5–5.0)
ALK PHOS: 47 U/L (ref 38–126)
ALT: 37 U/L (ref 0–44)
AST: 19 U/L (ref 15–41)
Anion gap: 7 (ref 5–15)
BUN: 18 mg/dL (ref 8–23)
CALCIUM: 9 mg/dL (ref 8.9–10.3)
CO2: 25 mmol/L (ref 22–32)
CREATININE: 1.1 mg/dL (ref 0.61–1.24)
Chloride: 104 mmol/L (ref 98–111)
GFR calc Af Amer: >60 mL/min (ref >60)
GFR calc non Af Amer: >60 mL/min (ref >60)
GLUCOSE: 101 mg/dL — AB (ref 70–99)
Potassium: 4 mmol/L (ref 3.5–5.1)
SODIUM: 136 mmol/L (ref 135–145)
Total Bilirubin: 0.6 mg/dL (ref 0.3–1.2)
Total Protein: 7.1 g/dL (ref 6.5–8.1)

## 2018-08-01 LAB — T4, FREE: Free T4: 1.08 ng/dL (ref 0.82–1.77)

## 2018-08-01 MED ORDER — TRAZODONE HCL 50 MG PO TABS: 50.0000 mg | ORAL_TABLET | Freq: Every day | ORAL | 0 refills | Status: DC

## 2018-08-01 MED ORDER — SACUBITRIL-VALSARTAN 49-51 MG PO TABS: 1.0000 | ORAL_TABLET | Freq: Two times a day (BID) | ORAL | 6 refills | Status: AC

## 2018-08-01 NOTE — Patient Instructions (Signed)
Keep Amiodarone 200mg  twice a day  Increase Entresto 49/51mg  (1 tab) twice a day  Trazodone 50mg  every night  Labs today We will only contact you if something comes back abnormal or we need to make some changes. Otherwise no news is good news!  Your physician recommends that you schedule a follow-up appointment in: 2 weeks

## 2018-08-01 NOTE — Progress Notes (Signed)
Work letter

## 2018-08-02 LAB — THYROID PANEL WITH TSH
Free Thyroxine Index: 2.5 (ref 1.2–4.9)
T3 Uptake Ratio: 30 % (ref 24–39)
T4 TOTAL: 8.3 ug/dL (ref 4.5–12.0)
TSH: 4.38 u[IU]/mL (ref 0.450–4.500)

## 2018-08-03 LAB — T3, FREE: T3, Free: 2.6 pg/mL (ref 2.0–4.4)

## 2018-08-20 NOTE — Progress Notes (Signed)
Patient ID: Marcus Bates, male   DOB: [birth year], 62 y.o.   MRN: [id number]    Advanced Heart Failure Clinic Note   PCP: [facility] M[facility] Primary Cardiologist: Bensimhon  HPI: [name] is a 62 y.o. with no significant PMHx who was admitted in August 2016 for acute systolic HF complicated by cardiogenic shock and RLL PE. He also has a history of LV thrombus, PAF, allergic to warfarin, and sleep disturbances.   Echo 05/14/15 showed EF 10-15% with grade 3 diastolic dysfunction and LV mural thrombus. Severe RV dysfunction.  Cardiac cath 05/02/15 showed minimal nonobstructive CAD. RHC cath on 8/31 c/w low output. Was treated with milrinone which was eventually weaned off. HF med titration limited by low BP.  He developed a drug rash in the hospital. Initially thought to be due to spironolactone but persisted and then felt to be related to warfarin, transitioned to Eliquis. Discharge weight 141 pounds.  He was admitted 11/11-11/20/19 from HF clinic with SVT, then afib RVR 170-190s. He required amiodarone drip, then transitioned to PO after appropriate time. Repeat echo showed EF 10-15% with RV dysfunction. Central line placed with concerns for low output. Coox was low and he was started on milrinone and norepinephrine. He chemically converted into NSR on amio. Heparin transitioned to Eliquis. He was diuresed with IV lasix, but did not require lasix at DC (started on [place]). He received IV iron 11/14. Recommended LifeVest at DC due to frequent NSVT, but he refused. HF meds titrated as able. DC weight: 151 lbs.  He returns today for hospital follow up. Last visit Entresto was increased. Overall doing well. He is able to walk 1 mile with some incline with no SOB. Has not tired stairs. Denies edema, orthopnea, or PND. No CP or dizziness. No palpitations or bleeding. Appetite and energy level okay. Taking all medications. He limits fluid and salt intake. Still having problems with insomnia, but plans to see sleep doctor  soon. Wants to go back to work.   Echo 07/18/18: EF 10-15%, mild MR, LA and RA mod dilated, RV moderately reduced ECHO 2019 EF 25-30%  Echo 05/2017 with EF 45-50%, with Grade 1 DD.  ECHO 03/08/2016: No evidence of LV thrombus. EF 30-35%. RV ok.  RHC 05/07/2015 RA = 9 RV = 39/5/12 PA = 41/20 (28) PCW = 18 Fick cardiac output/index = 2.6/1.4 Therms cardiac output/index = 3.6/2.0 PVR = 3.8 WU FA sat = 98% PA sat = 54%, 57  Cardiac MRI 05/05/15 1. Severely dilated left and right ventricle with severe biventricular systolic impairment. LVEF 13%  Diffuse mid wall late gadolinium enhancement in the myocardium of the left and right ventricle and in the RV attachment points to the LV consistent with dilated cardiomyopathy with biventricular involvement and decompensated heart failure. There is no evidence of inflammatory or infiltrative cardiomyopathy. 2. Severely dilated left atrium and moderately dilated right atrium. 3. Severe mitral and moderate tricuspid regurgitation. 4. Dilated main pulmonary artery consistent with pulmonary hypertension. There is severe swirling of the blood in the right ventricle suspicious of the pre-thrombotic state.  Review of systems complete and found to be negative unless listed in HPI.   SH:  Social History   Socioeconomic History  . Marital status: Single    Spouse name: Not on file  . Number of children: Not on file  . Years of education: Not on file  . Highest education level: Not on file  Occupational History    Comment: Tools  Social Needs  .  Financial resource strain: Not on file  . Food insecurity:    Worry: Not on file    Inability: Not on file  . Transportation needs:    Medical: Not on file    Non-medical: Not on file  Tobacco Use  . Smoking status: Never Smoker  . Smokeless tobacco: Never Used  Substance and Sexual Activity  . Alcohol use: No    Alcohol/week: 0.0 standard drinks  . Drug use: No  . Sexual activity: Not on file    Lifestyle  . Physical activity:    Days per week: Not on file    Minutes per session: Not on file  . Stress: Not on file  Relationships  . Social connections:    Talks on phone: Not on file    Gets together: Not on file    Attends religious service: Not on file    Active member of club or organization: Not on file    Attends meetings of clubs or organizations: Not on file    Relationship status: Not on file  . Intimate partner violence:    Fear of current or ex partner: Not on file    Emotionally abused: Not on file    Physically abused: Not on file    Forced sexual activity: Not on file  Other Topics Concern  . Not on file  Social History Narrative  . Not on file    FH:  Family History  Problem Relation Age of Onset  . Heart disease Other   . CAD Maternal Grandfather        MI in his 75s maybe?  . Stroke Paternal Grandfather   . CAD Maternal Aunt        bypass at 28  . Pulmonary embolism Neg Hx     Past Medical History:  Diagnosis Date  . Acute deep vein thrombosis (DVT) of popliteal vein of right lower extremity (HCC)    a. 04/2015 -> Xarelto  . Anxiety   . Asthma   . Chronic combined systolic and diastolic CHF (congestive heart failure) (HCC)    a. 04/2015 Echo: EF 10-15%, sev diff HK, Gr 3 DD, sev MR, sev dil LA, dilated RV, mod dil RA.  . Depression   . Insomnia   . LV (left ventricular) mural thrombus    a. 04/2015 Echo: possible laminated echodense area on inferior wall, cannot exclude laminated thrombus.  Marland Kitchen NICM (nonischemic cardiomyopathy) (HCC)    a. 04/2015 Echo: EF 10-15%, sev diff HK.  . Non-obstructive CAD    a. 04/2015 Cath: LM nl, LAD 66m, LCX 20ost, RCA 20d.  Marland Kitchen NSVT (nonsustained ventricular tachycardia) (HCC)   . Pulmonary embolism on right (HCC)    a. 04/2015 -> Xarelto  . Restless leg syndrome   . Severe mitral insufficiency    a. 04/2015 Echo: Sev MR.    Current Outpatient Medications  Medication Sig Dispense Refill  . ACETAMINOPHEN PO  Take 1,300 mg by mouth at bedtime.     Marland Kitchen albuterol (PROVENTIL HFA;VENTOLIN HFA) 108 (90 BASE) MCG/ACT inhaler Inhale 2 puffs into the lungs every 4 (four) hours as needed for wheezing or shortness of breath. 1 Inhaler 2  . amiodarone (PACERONE) 200 MG tablet Take 1 tablet (200 mg total) by mouth 2 (two) times daily. 60 tablet 6  . apixaban (ELIQUIS) 5 MG TABS tablet Take 1 tablet (5 mg total) by mouth 2 (two) times daily. 60 tablet 6  . digoxin (LANOXIN) 0.125 MG tablet Take  1 tablet (0.125 mg total) by mouth daily. 30 tablet 6  . Fluticasone-Salmeterol (ADVAIR) 250-50 MCG/DOSE AEPB Inhale 1 puff into the lungs 2 (two) times daily as needed (SOB, wheezing). Reported on 11/10/2015    . gabapentin (NEURONTIN) 300 MG capsule Take 300 mg by mouth at bedtime.     . Guarana 1000 MG TABS Take 1,000 mg by mouth daily.    . iron polysaccharides (NIFEREX) 150 MG capsule Take 150 mg by mouth 2 (two) times daily.     . magnesium oxide (MAG-OX) 400 MG tablet Take 200 mg by mouth daily.     . Multiple Vitamins-Minerals (MULTIVITAMIN WITH MINERALS) tablet Take 1 tablet by mouth daily.    . Omega-3 Fatty Acids (FISH OIL) 1000 MG CAPS Take 1,000 mg by mouth daily. Reported on 11/10/2015    . PASSION FLOWER-VALERIAN PO Take 1 tablet by mouth daily.    . sacubitril-valsartan (ENTRESTO) 49-51 MG Take 1 tablet by mouth 2 (two) times daily. 60 tablet 6  . traZODone (DESYREL) 50 MG tablet Take 1 tablet (50 mg total) by mouth at bedtime. 30 tablet 0  . vitamin A 40981 UNIT capsule Take 25,000 Units by mouth every other day.     . vitamin E 100 UNIT capsule Take 100 Units by mouth every other day.      No current facility-administered medications for this encounter.     Vitals:   08/21/18 1530  BP: (!) 142/88  Pulse: 75  SpO2: 98%  Weight: 70.3 kg (155 lb)    Wt Readings from Last 3 Encounters:  08/21/18 70.3 kg (155 lb)  08/01/18 70.9 kg (156 lb 6.4 oz)  07/26/18 68.6 kg (151 lb 3.8 oz)    PHYSICAL  EXAM: General: Well appearing. No resp difficulty. HEENT: Normal Neck: Supple. JVP flat. Carotids 2+ bilat; no bruits. No thyromegaly or nodule noted. Cor: PMI nondisplaced. RRR, No M/G/R noted Lungs: CTAB, normal effort. Abdomen: Soft, non-tender, non-distended, no HSM. No bruits or masses. +BS  Extremities: No cyanosis, clubbing, or rash. R and LLE no edema.  Neuro: Alert & orientedx3, cranial nerves grossly intact. moves all 4 extremities w/o difficulty. Affect pleasant   ASSESSMENT & PLAN: 1.ParoxymsalA Fib RVR/SVT - Regular on exam today.  - Continue amiodarone 200 mg twice a day. Keep at BID for at least 2 months (through end of January) per Dr Gala Romney.  - Discussed need for eye appointments while on amiodarone. LFTs and TSH normal 07/2018. - Continue eliquis 5 mg twice a day. Denies bleeding. - Received IV iron on 11/14  - Allergic to warfarin.  2. Chronic systolic HF with severe biventricular dysfunction EF 10-15% due to NICM -> Cardiogenic shock. Newly diagnosed 8/16. Echo 09/10/15 with EF 25%. Echo 7/17 EF 25-30%. Refused ICD and LifeVest - Echo 05/2017 with EF 45-50%, with Grade 1 DD.EF back down in August on this year. ECHO 04/2018 25-30%.  - Echo 07/18/18: EF 10-15%, mild MR, LA and RA mod dilated, RV moderately reduced - Volume status stable on exam  - Continue entresto 49/51 mg twice a day. BMET today. Refuses higher dose because it has made his insomnia worse in the past. - Off corlanor with afib. - Failed spiro in past. Had rash.Add eplerenone 12.5 mg daily. Discussed with PharmD and is safe to try despite allergy to spiro. BMET 7-10 days.  - Continue digoxin 0.125 mg daily. Dig level 0.3 07/2018 - No b-blocker yet with low output - Add echo on to February  visit. - He may return to work on light duty with rest breaks as needed per Dr Gala Romney. Provided a note.  - Refuses LifeVest  3.Mild nonobstructive CAD by cath 04/2015 - Continue eliquis. - No s/s  ischemia.    4. [name] PVCs/NSVT.  - Continue amio 200 mg twice a day. Keep at this dose for at least 2 months (through end of January) per Dr Gala Romney. TSH and LFTs stable 07/2018.  5. Anemia - Got IV iron 11/14.   6. Hx of LV thrombus and PE - Back on Eliquis. Denies bleeding.   7. MR - Mild on recent echo. No change.   8. Insomnia - Per PCP. Sees a sleep doctor soon.   Keep follow up with Dr Gala Romney 2/3. Add on echo to that appointment.  BMET today Start eplerenone 12.5 mg daily BMET in 7-10 days Provided work note for light duty with rest breaks. Discussed with Dr Gala Romney.   Alford Highland, NP  3:32 PM   Greater than 50% of the 25 minute visit was spent in counseling/coordination of care regarding disease state education, salt/fluid restriction, sliding scale diuretics, and medication compliance.

## 2018-08-21 ENCOUNTER — Encounter (HOSPITAL_COMMUNITY): Payer: Self-pay

## 2018-08-21 ENCOUNTER — Ambulatory Visit (HOSPITAL_COMMUNITY)
Admission: RE | Admit: 2018-08-21 | Discharge: 2018-08-21 | Disposition: A | Discharge status: Home / Self Care | Payer: BLUE CROSS/BLUE SHIELD | Source: Ambulatory Visit | Attending: Internal Medicine | Admitting: Internal Medicine

## 2018-08-21 ENCOUNTER — Encounter (HOSPITAL_COMMUNITY): Payer: Self-pay | Admitting: Cardiology

## 2018-08-21 VITALS — BP 142/88 | HR 75 | Wt 155.0 lb

## 2018-08-21 DIAGNOSIS — I428 Other cardiomyopathies: Secondary | ICD-10-CM | POA: Diagnosis not present

## 2018-08-21 DIAGNOSIS — Z86718 Personal history of other venous thrombosis and embolism: Secondary | ICD-10-CM | POA: Diagnosis not present

## 2018-08-21 DIAGNOSIS — I471 Supraventricular tachycardia: Secondary | ICD-10-CM | POA: Diagnosis not present

## 2018-08-21 DIAGNOSIS — Z09 Encounter for follow-up examination after completed treatment for conditions other than malignant neoplasm: Secondary | ICD-10-CM | POA: Insufficient documentation

## 2018-08-21 DIAGNOSIS — Z7901 Long term (current) use of anticoagulants: Secondary | ICD-10-CM | POA: Insufficient documentation

## 2018-08-21 DIAGNOSIS — Z8249 Family history of ischemic heart disease and other diseases of the circulatory system: Secondary | ICD-10-CM | POA: Insufficient documentation

## 2018-08-21 DIAGNOSIS — I472 Ventricular tachycardia: Secondary | ICD-10-CM | POA: Insufficient documentation

## 2018-08-21 DIAGNOSIS — I480 Paroxysmal atrial fibrillation: Secondary | ICD-10-CM | POA: Insufficient documentation

## 2018-08-21 DIAGNOSIS — I5022 Chronic systolic (congestive) heart failure: Secondary | ICD-10-CM | POA: Diagnosis not present

## 2018-08-21 DIAGNOSIS — I5042 Chronic combined systolic (congestive) and diastolic (congestive) heart failure: Secondary | ICD-10-CM | POA: Diagnosis not present

## 2018-08-21 DIAGNOSIS — D649 Anemia, unspecified: Secondary | ICD-10-CM | POA: Diagnosis not present

## 2018-08-21 DIAGNOSIS — I5082 Biventricular heart failure: Secondary | ICD-10-CM | POA: Diagnosis not present

## 2018-08-21 DIAGNOSIS — Z79899 Other long term (current) drug therapy: Secondary | ICD-10-CM | POA: Diagnosis not present

## 2018-08-21 DIAGNOSIS — I493 Ventricular premature depolarization: Secondary | ICD-10-CM | POA: Diagnosis not present

## 2018-08-21 DIAGNOSIS — R570 Cardiogenic shock: Secondary | ICD-10-CM | POA: Insufficient documentation

## 2018-08-21 DIAGNOSIS — G4700 Insomnia, unspecified: Secondary | ICD-10-CM

## 2018-08-21 DIAGNOSIS — I2510 Atherosclerotic heart disease of native coronary artery without angina pectoris: Secondary | ICD-10-CM | POA: Diagnosis not present

## 2018-08-21 DIAGNOSIS — I340 Nonrheumatic mitral (valve) insufficiency: Secondary | ICD-10-CM | POA: Diagnosis not present

## 2018-08-21 DIAGNOSIS — Z86711 Personal history of pulmonary embolism: Secondary | ICD-10-CM | POA: Diagnosis not present

## 2018-08-21 DIAGNOSIS — J45909 Unspecified asthma, uncomplicated: Secondary | ICD-10-CM | POA: Insufficient documentation

## 2018-08-21 DIAGNOSIS — G2581 Restless legs syndrome: Secondary | ICD-10-CM | POA: Diagnosis not present

## 2018-08-21 LAB — BASIC METABOLIC PANEL
ANION GAP: 9 (ref 5–15)
BUN: 16 mg/dL (ref 8–23)
CO2: 27 mmol/L (ref 22–32)
Calcium: 9.5 mg/dL (ref 8.9–10.3)
Chloride: 105 mmol/L (ref 98–111)
Creatinine, Ser: 1.14 mg/dL (ref 0.61–1.24)
GFR calc Af Amer: >60 mL/min (ref >60)
GFR calc non Af Amer: >60 mL/min (ref >60)
Glucose, Bld: 75 mg/dL (ref 70–99)
POTASSIUM: 4.1 mmol/L (ref 3.5–5.1)
Sodium: 141 mmol/L (ref 135–145)

## 2018-08-21 MED ORDER — EPLERENONE 25 MG PO TABS: 12.5000 mg | ORAL_TABLET | Freq: Every day | ORAL | 11 refills | Status: AC

## 2018-08-21 NOTE — Patient Instructions (Addendum)
START Inspra 12.5mg , one half tab daily   Labs today We will only contact you if something comes back abnormal or we need to make some changes. Otherwise no news is good news!   Labs needed in 7-14 days  Your physician has requested that you have an echocardiogram. Echocardiography is a painless test that uses sound waves to create images of your heart. It provides your doctor with information about the size and shape of your heart and how well your heart's chambers and valves are working. This procedure takes approximately one hour. There are no restrictions for this procedure.  Keep your follow up as scheduled with Dr Gala Romney  Do the following things EVERYDAY: 1) Weigh yourself in the morning before breakfast. Write it down and keep it in a log. 2) Take your medicines as prescribed 3) Eat low salt foods-Limit salt (sodium) to 2000 mg per day.  4) Stay as active as you can everyday 5) Limit all fluids for the day to less than 2 liters

## 2018-08-21 NOTE — Progress Notes (Signed)
-

## 2018-08-24 ENCOUNTER — Encounter (HOSPITAL_COMMUNITY): Payer: Self-pay | Admitting: Cardiology

## 2018-08-24 ENCOUNTER — Other Ambulatory Visit (HOSPITAL_COMMUNITY): Payer: Self-pay | Admitting: Cardiology

## 2018-08-28 ENCOUNTER — Other Ambulatory Visit (HOSPITAL_COMMUNITY): Payer: BLUE CROSS/BLUE SHIELD

## 2018-08-31 ENCOUNTER — Other Ambulatory Visit (HOSPITAL_COMMUNITY): Payer: BLUE CROSS/BLUE SHIELD

## 2018-09-04 ENCOUNTER — Other Ambulatory Visit (HOSPITAL_COMMUNITY): Payer: BLUE CROSS/BLUE SHIELD

## 2018-09-05 ENCOUNTER — Ambulatory Visit (HOSPITAL_COMMUNITY)
Admission: RE | Admit: 2018-09-05 | Discharge: 2018-09-05 | Disposition: A | Discharge status: Home / Self Care | Payer: BLUE CROSS/BLUE SHIELD | Source: Ambulatory Visit | Attending: Cardiology | Admitting: Cardiology

## 2018-09-05 DIAGNOSIS — G4721 Circadian rhythm sleep disorder, delayed sleep phase type: Secondary | ICD-10-CM | POA: Diagnosis not present

## 2018-09-05 DIAGNOSIS — I5022 Chronic systolic (congestive) heart failure: Secondary | ICD-10-CM | POA: Diagnosis not present

## 2018-09-05 DIAGNOSIS — J45909 Unspecified asthma, uncomplicated: Secondary | ICD-10-CM | POA: Diagnosis not present

## 2018-09-05 DIAGNOSIS — J0100 Acute maxillary sinusitis, unspecified: Secondary | ICD-10-CM | POA: Diagnosis not present

## 2018-09-05 DIAGNOSIS — G4712 Idiopathic hypersomnia without long sleep time: Secondary | ICD-10-CM | POA: Diagnosis not present

## 2018-09-05 LAB — BASIC METABOLIC PANEL
Anion gap: 11 (ref 5–15)
BUN: 17 mg/dL (ref 8–23)
CO2: 25 mmol/L (ref 22–32)
Calcium: 9.2 mg/dL (ref 8.9–10.3)
Chloride: 102 mmol/L (ref 98–111)
Creatinine, Ser: 1.1 mg/dL (ref 0.61–1.24)
GFR calc Af Amer: >60 mL/min (ref >60)
GFR calc non Af Amer: >60 mL/min (ref >60)
Glucose, Bld: 103 mg/dL — ABNORMAL HIGH (ref 70–99)
Potassium: 3.8 mmol/L (ref 3.5–5.1)
Sodium: 138 mmol/L (ref 135–145)

## 2018-10-02 ENCOUNTER — Ambulatory Visit (HOSPITAL_COMMUNITY)
Admission: RE | Admit: 2018-10-02 | Discharge: 2018-10-02 | Disposition: A | Discharge status: Home / Self Care | Payer: BLUE CROSS/BLUE SHIELD | Source: Ambulatory Visit | Attending: Cardiology | Admitting: Cardiology

## 2018-10-02 DIAGNOSIS — I472 Ventricular tachycardia: Secondary | ICD-10-CM | POA: Insufficient documentation

## 2018-10-02 DIAGNOSIS — I2510 Atherosclerotic heart disease of native coronary artery without angina pectoris: Secondary | ICD-10-CM | POA: Insufficient documentation

## 2018-10-02 DIAGNOSIS — I5022 Chronic systolic (congestive) heart failure: Secondary | ICD-10-CM | POA: Diagnosis not present

## 2018-10-02 DIAGNOSIS — I428 Other cardiomyopathies: Secondary | ICD-10-CM | POA: Insufficient documentation

## 2018-10-02 DIAGNOSIS — Z86711 Personal history of pulmonary embolism: Secondary | ICD-10-CM | POA: Diagnosis not present

## 2018-10-02 NOTE — Progress Notes (Signed)
  Echocardiogram 2D Echocardiogram has been performed.  [name] 10/02/2018, 3:41 PM

## 2018-10-09 ENCOUNTER — Encounter (HOSPITAL_COMMUNITY): Payer: Self-pay | Admitting: Internal Medicine

## 2018-10-09 ENCOUNTER — Other Ambulatory Visit (HOSPITAL_COMMUNITY): Payer: BLUE CROSS/BLUE SHIELD

## 2018-10-09 ENCOUNTER — Ambulatory Visit (HOSPITAL_COMMUNITY)
Admission: RE | Admit: 2018-10-09 | Discharge: 2018-10-09 | Disposition: A | Discharge status: Home / Self Care | Payer: BLUE CROSS/BLUE SHIELD | Source: Ambulatory Visit | Attending: Internal Medicine | Admitting: Internal Medicine

## 2018-10-09 VITALS — BP 130/74 | HR 72 | Wt 157.4 lb

## 2018-10-09 DIAGNOSIS — Z86718 Personal history of other venous thrombosis and embolism: Secondary | ICD-10-CM | POA: Insufficient documentation

## 2018-10-09 DIAGNOSIS — I5022 Chronic systolic (congestive) heart failure: Secondary | ICD-10-CM

## 2018-10-09 DIAGNOSIS — Z86711 Personal history of pulmonary embolism: Secondary | ICD-10-CM | POA: Insufficient documentation

## 2018-10-09 DIAGNOSIS — I2510 Atherosclerotic heart disease of native coronary artery without angina pectoris: Secondary | ICD-10-CM | POA: Diagnosis not present

## 2018-10-09 DIAGNOSIS — D649 Anemia, unspecified: Secondary | ICD-10-CM | POA: Insufficient documentation

## 2018-10-09 DIAGNOSIS — I428 Other cardiomyopathies: Secondary | ICD-10-CM | POA: Diagnosis not present

## 2018-10-09 DIAGNOSIS — J45909 Unspecified asthma, uncomplicated: Secondary | ICD-10-CM | POA: Diagnosis not present

## 2018-10-09 DIAGNOSIS — I493 Ventricular premature depolarization: Secondary | ICD-10-CM | POA: Insufficient documentation

## 2018-10-09 DIAGNOSIS — R570 Cardiogenic shock: Secondary | ICD-10-CM | POA: Insufficient documentation

## 2018-10-09 DIAGNOSIS — I471 Supraventricular tachycardia: Secondary | ICD-10-CM | POA: Diagnosis not present

## 2018-10-09 DIAGNOSIS — G4700 Insomnia, unspecified: Secondary | ICD-10-CM | POA: Insufficient documentation

## 2018-10-09 DIAGNOSIS — Z7901 Long term (current) use of anticoagulants: Secondary | ICD-10-CM | POA: Insufficient documentation

## 2018-10-09 DIAGNOSIS — I480 Paroxysmal atrial fibrillation: Secondary | ICD-10-CM | POA: Insufficient documentation

## 2018-10-09 DIAGNOSIS — Z8249 Family history of ischemic heart disease and other diseases of the circulatory system: Secondary | ICD-10-CM | POA: Insufficient documentation

## 2018-10-09 DIAGNOSIS — Z79899 Other long term (current) drug therapy: Secondary | ICD-10-CM | POA: Insufficient documentation

## 2018-10-09 DIAGNOSIS — G2581 Restless legs syndrome: Secondary | ICD-10-CM | POA: Diagnosis not present

## 2018-10-09 DIAGNOSIS — I5042 Chronic combined systolic (congestive) and diastolic (congestive) heart failure: Secondary | ICD-10-CM | POA: Diagnosis not present

## 2018-10-09 LAB — BASIC METABOLIC PANEL
Anion gap: 10 (ref 5–15)
BUN: 29 mg/dL — ABNORMAL HIGH (ref 8–23)
CO2: 23 mmol/L (ref 22–32)
CREATININE: 1.16 mg/dL (ref 0.61–1.24)
Calcium: 9.3 mg/dL (ref 8.9–10.3)
Chloride: 106 mmol/L (ref 98–111)
GFR calc Af Amer: >60 mL/min (ref >60)
GFR calc non Af Amer: >60 mL/min (ref >60)
Glucose, Bld: 98 mg/dL (ref 70–99)
Potassium: 4.3 mmol/L (ref 3.5–5.1)
SODIUM: 139 mmol/L (ref 135–145)

## 2018-10-09 LAB — CBC
HCT: 45.8 % (ref 39.0–52.0)
Hemoglobin: 14.4 g/dL (ref 13.0–17.0)
MCH: 30.3 pg (ref 26.0–34.0)
MCHC: 31.4 g/dL (ref 30.0–36.0)
MCV: 96.2 fL (ref 80.0–100.0)
Platelets: 204 10*3/uL (ref 150–400)
RBC: 4.76 MIL/uL (ref 4.22–5.81)
RDW: 14.2 % (ref 11.5–15.5)
WBC: 8.2 10*3/uL (ref 4.0–10.5)
nRBC: 0 % (ref 0.0–0.2)

## 2018-10-09 LAB — DIGOXIN LEVEL: DIGOXIN LVL: 0.7 ng/mL — AB (ref 0.8–2.0)

## 2018-10-09 MED ORDER — AMIODARONE HCL 200 MG PO TABS: 200.0000 mg | ORAL_TABLET | Freq: Every day | ORAL | 6 refills | Status: AC

## 2018-10-09 MED ORDER — CARVEDILOL 3.125 MG PO TABS: 3.1250 mg | ORAL_TABLET | Freq: Two times a day (BID) | ORAL | 6 refills | Status: AC

## 2018-10-09 NOTE — Patient Instructions (Addendum)
DECREASE Amiodarone to 200mg  (1 tab) daily  START Coreg 3.125mg  (1 tab) twice a day  Labs today We will only contact you if something comes back abnormal or we need to make some changes. Otherwise no news is good news!  You have been referred to cardiac rehab. They will call you to schedule your appointment.   Your physician recommends that you schedule a follow-up appointment in: 2 months.

## 2018-10-09 NOTE — Progress Notes (Addendum)
Patient ID: Marcus Bates, male   DOB: 1956/06/29, 63 y.o.   MRN: [id number]    Advanced Heart Failure Clinic Note   PCP: Fulton Mole Primary Cardiologist:   HPI: Marcus Bates is a 63 y.o. with no significant PMHx who was admitted in August 2016 for acute systolic HF complicated by cardiogenic shock and RLL PE. He also has a history of LV thrombus, PAF, allergic to warfarin, and sleep disturbances.   Echo 05/14/15 showed EF 10-15% with grade 3 diastolic dysfunction and LV mural thrombus. Severe RV dysfunction.  Cardiac cath 05/02/15 showed minimal nonobstructive CAD. RHC cath on 8/31 c/w low output. Was treated with milrinone which was eventually weaned off. HF med titration limited by low BP.  He developed a drug rash in the hospital. Initially thought to be due to spironolactone but persisted and then felt to be related to warfarin, transitioned to Eliquis. Discharge weight 141 pounds.  He was admitted 11/11-11/20/19 from HF clinic with SVT, then afib RVR 170-190s. He required amiodarone drip, then transitioned to PO after appropriate time. Repeat echo showed EF 10-15% with RV dysfunction. Central line placed with concerns for low output. Coox was low and he was started on milrinone and norepinephrine. He chemically converted into NSR on amio. Heparin transitioned to Eliquis. He was diuresed with IV lasix, but did not require lasix at DC (started on [place]). He received IV iron 11/14. Recommended LifeVest at DC due to frequent NSVT, but he refused. HF meds titrated as able. DC weight: 151 lbs.  He presents today for regular follow up. Last visit added on Eplerenone with rash to [facility]. Feels back to normal. Back to work. No palpitations, CP, SOB, orthopnea or PND. No edema. Has cut amio to 1 tablet in am and 1/2 tab at bedtime. No bleeding on Eliquis.   Echo 10/02/18: EF 25-30%, Mod LAE  Echo 07/18/18: EF 10-15%, mild MR, LA and RA mod dilated, RV moderately reduced Echo 2019 EF 25-30%  Echo  05/2017 with EF 45-50%, with Grade 1 DD.  Echo 03/08/2016: No evidence of LV thrombus. EF 30-35%. RV ok.  RHC 05/07/2015 RA = 9 RV = 39/5/12 PA = 41/20 (28) PCW = 18 Fick cardiac output/index = 2.6/1.4 Therms cardiac output/index = 3.6/2.0 PVR = 3.8 WU FA sat = 98% PA sat = 54%, 57  Cardiac MRI 05/05/15 1. Severely dilated left and right ventricle with severe biventricular systolic impairment. LVEF 13%  Diffuse mid wall late gadolinium enhancement in the myocardium of the left and right ventricle and in the RV attachment points to the LV consistent with dilated cardiomyopathy with biventricular involvement and decompensated heart failure. There is no evidence of inflammatory or infiltrative cardiomyopathy. 2. Severely dilated left atrium and moderately dilated right atrium. 3. Severe mitral and moderate tricuspid regurgitation. 4. Dilated main pulmonary artery consistent with pulmonary hypertension. There is severe swirling of the blood in the right ventricle suspicious of the pre-thrombotic state.  Review of systems complete and found to be negative unless listed in HPI.    SH:  Social History   Socioeconomic History  . Marital status: Single    Spouse name: Not on file  . Number of children: Not on file  . Years of education: Not on file  . Highest education level: Not on file  Occupational History    Comment: Tools  Social Needs  . Financial resource strain: Not on file  . Food insecurity:    Worry: Not on file  Inability: Not on file  . Transportation needs:    Medical: Not on file    Non-medical: Not on file  Tobacco Use  . Smoking status: Never Smoker  . Smokeless tobacco: Never Used  Substance and Sexual Activity  . Alcohol use: No    Alcohol/week: 0.0 standard drinks  . Drug use: No  . Sexual activity: Not on file  Lifestyle  . Physical activity:    Days per week: Not on file    Minutes per session: Not on file  . Stress: Not on file  Relationships  .  Social connections:    Talks on phone: Not on file    Gets together: Not on file    Attends religious service: Not on file    Active member of club or organization: Not on file    Attends meetings of clubs or organizations: Not on file    Relationship status: Not on file  . Intimate partner violence:    Fear of current or ex partner: Not on file    Emotionally abused: Not on file    Physically abused: Not on file    Forced sexual activity: Not on file  Other Topics Concern  . Not on file  Social History Narrative  . Not on file    FH:  Family History  Problem Relation Age of Onset  . Heart disease Other   . CAD Maternal Grandfather        MI in his 7250s maybe?  . Stroke Paternal Grandfather   . CAD Maternal Aunt        bypass at [id number]  . Pulmonary embolism Neg Hx     Past Medical History:  Diagnosis Date  . Acute deep vein thrombosis (DVT) of popliteal vein of right lower extremity (HCC)    a. 04/2015 -> Xarelto  . Anxiety   . Asthma   . Chronic combined systolic and diastolic CHF (congestive heart failure) (HCC)    a. 04/2015 Echo: EF 10-15%, sev diff HK, Gr 3 DD, sev MR, sev dil LA, dilated RV, mod dil RA.  . Depression   . Insomnia   . LV (left ventricular) mural thrombus    a. 04/2015 Echo: possible laminated echodense area on inferior wall, cannot exclude laminated thrombus.  Marland Kitchen. NICM (nonischemic cardiomyopathy) (HCC)    a. 04/2015 Echo: EF 10-15%, sev diff HK.  . Non-obstructive CAD    a. 04/2015 Cath: LM nl, LAD 3552m, LCX 20ost, RCA 20d.  Marland Kitchen. NSVT (nonsustained ventricular tachycardia) (HCC)   . Pulmonary embolism on right (HCC)    a. 04/2015 -> Xarelto  . Restless leg syndrome   . Severe mitral insufficiency    a. 04/2015 Echo: Sev MR.    Current Outpatient Medications  Medication Sig Dispense Refill  . ACETAMINOPHEN PO Take 1,300 mg by mouth at bedtime.     Marland Kitchen. amiodarone (PACERONE) 200 MG tablet Take 1 tablet (200 mg total) by mouth 2 (two) times daily. 60 tablet 6    . apixaban (ELIQUIS) 5 MG TABS tablet Take 1 tablet (5 mg total) by mouth 2 (two) times daily. 60 tablet 6  . digoxin (LANOXIN) 0.125 MG tablet Take 1 tablet (0.125 mg total) by mouth daily. 30 tablet 6  . eplerenone (INSPRA) 25 MG tablet Take 0.5 tablets (12.5 mg total) by mouth daily. 15 tablet 11  . Fluticasone-Salmeterol (ADVAIR) 250-50 MCG/DOSE AEPB Inhale 1 puff into the lungs 2 (two) times daily as needed (SOB, wheezing). Reported  on 11/10/2015    . gabapentin (NEURONTIN) 300 MG capsule Take 300 mg by mouth at bedtime.     . [place] 1000 MG TABS Take 1,000 mg by mouth daily.    . iron polysaccharides (NIFEREX) 150 MG capsule Take 150 mg by mouth 2 (two) times daily.     . magnesium oxide (MAG-OX) 400 MG tablet Take 200 mg by mouth daily.     . Multiple Vitamins-Minerals (MULTIVITAMIN WITH MINERALS) tablet Take 1 tablet by mouth daily.    . Omega-3 Fatty Acids (FISH OIL) 1000 MG CAPS Take 1,000 mg by mouth daily. Reported on 11/10/2015    . PASSION FLOWER-VALERIAN PO Take 1 tablet by mouth daily.    . sacubitril-valsartan (ENTRESTO) 49-51 MG Take 1 tablet by mouth 2 (two) times daily. 60 tablet 6  . traZODone (DESYREL) 50 MG tablet TAKE 1 TABLET BY MOUTH EVERYDAY AT BEDTIME 30 tablet 0  . vitamin A 88325 UNIT capsule Take 25,000 Units by mouth every other day.     . vitamin E 100 UNIT capsule Take 100 Units by mouth every other day.     . albuterol (PROVENTIL HFA;VENTOLIN HFA) 108 (90 BASE) MCG/ACT inhaler Inhale 2 puffs into the lungs every 4 (four) hours as needed for wheezing or shortness of breath. (Patient not taking: Reported on 10/09/2018) 1 Inhaler 2   No current facility-administered medications for this encounter.    Vitals:   10/09/18 1355  BP: 130/74  Pulse: 72  SpO2: 98%  Weight: 71.4 kg (157 lb 6.4 oz)     Wt Readings from Last 3 Encounters:  10/09/18 71.4 kg (157 lb 6.4 oz)  08/21/18 70.3 kg (155 lb)  08/01/18 70.9 kg (156 lb 6.4 oz)   PHYSICAL EXAM: General:  Well  appearing. No resp difficulty HEENT: normal Neck: supple. no JVD. Carotids 2+ bilat; no bruits. No lymphadenopathy or thryomegaly appreciated. Cor: PMI nondisplaced. Regular rate & rhythm. No rubs, gallops or murmurs. Lungs: clear Abdomen: soft, nontender, nondistended. No hepatosplenomegaly. No bruits or masses. Good bowel sounds. Extremities: no cyanosis, clubbing, rash, edema Neuro: alert & orientedx3, cranial nerves grossly intact. moves all 4 extremities w/o difficulty. Affect pleasant  ASSESSMENT & PLAN: 1.ParoxymsalA Fib RVR/SVT - Regular on exam today.  - Will drop amio to 200 daily - Discussed need for eye appointments while on amiodarone. LFTs and TSH normal 07/2018. - Denies bleeding on Eliquis 5 mg BID.  - Allergic to warfarin. - We discussed possible evaluation for ablation but he prefers to stay on amio  2. Chronic systolic HF with severe biventricular dysfunction EF 10-15% due to NICM -> Cardiogenic shock. Newly diagnosed 8/16. Echo 09/10/15 with EF 25%. Echo 7/17 EF 25-30%. Refused ICD and LifeVest - Echo 05/2017 with EF 45-50%, with Grade 1 DD.EF back down in August on this year. ECHO 04/2018 25-30%.  - Echo 07/18/18: EF 10-15%, mild MR, LA and RA mod dilated, RV moderately reduced - Echo 10/02/18: EF 25-30%, Mod LAE - Volume status looks ok - Continue entresto 49/51 mg BID.Refuses higher dose because it has made his insomnia worse in the past. - Off corlanor with afib. - Failed spiro in past. Had rash.Continue eplerenone 12.5 mg daily. - Continue digoxin 0.125 mg daily. Dig level 0.3 07/2018 - Will add low-dose carvedilol 3.125 bid - BMET today - Refuses LifeVest or ICD - Refer to CR  3.Mild nonobstructive CAD by cath 04/2015 - Continue eliquis. No ASA.  - No s/s of ischemia.  4. Frequent PVCs/NSVT.  - On amio 200 daily  5. Anemia - Got IV iron 11/14. Stable. Will check CBC today  6. Hx of LV thrombus and PE - Denies bleeding on Eliquis.   7.  MR - Mild on recent echo. No change.   8. Insomnia - Per PCP. Sees a sleep doctor soon.   Arvilla Meresaniel , MD  2:16 PM

## 2018-10-09 NOTE — Addendum Note (Signed)
Encounter addended by: Marisa Hua, RN on: 10/09/2018 2:33 PM  Actions taken: Diagnosis association updated, Pharmacy for encounter modified, Order list changed, Charge Capture section accepted, Clinical Note Signed

## 2018-10-25 ENCOUNTER — Telehealth (HOSPITAL_COMMUNITY): Payer: Self-pay

## 2018-10-25 NOTE — Telephone Encounter (Signed)
Pt insurance is active and benefits verified through [employer]. Co-pay $0.00, DED $1,500.00/$1,500.00 met, out of pocket $4,000.00/$1,832.61 met, co-insurance 20%. No pre-authorization required. [employer]port, 10/25/2018 @ 11:45AM, REF# [id number] °

## 2018-10-27 NOTE — Telephone Encounter (Signed)
Called pt to see if he wanted to participate in Cardiac rehab pt stated that he is interested but his scheduled is pretty tight t-f and that he would call back at a later time. Tempie Donning. Support Rep II

## 2018-11-13 ENCOUNTER — Encounter (HOSPITAL_COMMUNITY): Payer: Self-pay

## 2018-11-13 NOTE — Telephone Encounter (Signed)
Attempted to call pt a 2nd time- LM ON VM ° °Mailed letter out °

## 2018-11-23 NOTE — Telephone Encounter (Signed)
Attempted to call patient in regards to Cardiac Rehab - to let pt know we are closed at this time due to the COVID-19 and will contact once we have resume scheduling.  °LMTCB °

## 2018-12-11 ENCOUNTER — Encounter (HOSPITAL_COMMUNITY): Payer: BLUE CROSS/BLUE SHIELD | Admitting: Internal Medicine

## 2018-12-14 ENCOUNTER — Telehealth (HOSPITAL_COMMUNITY): Payer: Self-pay | Admitting: *Deleted

## 2018-12-14 NOTE — Telephone Encounter (Signed)
Called and left message requesting call back for his interest in participating in cardiac rehab once we have resumed scheduling. Alanson Aly, BSN Cardiac and Emergency planning/management officer

## 2019-01-31 ENCOUNTER — Other Ambulatory Visit (HOSPITAL_COMMUNITY): Payer: Self-pay | Admitting: Adult Health

## 2019-02-08 ENCOUNTER — Other Ambulatory Visit (HOSPITAL_COMMUNITY): Payer: Self-pay | Admitting: Adult Health

## 2019-02-19 ENCOUNTER — Telehealth (HOSPITAL_COMMUNITY): Payer: Self-pay | Admitting: *Deleted

## 2019-02-19 NOTE — Telephone Encounter (Signed)
[facility] called to report pt DOA.

## 2019-02-21 ENCOUNTER — Telehealth (HOSPITAL_COMMUNITY): Payer: Self-pay

## 2019-02-21 NOTE — Telephone Encounter (Signed)
Death Certificate faxed to [employer] and [employer]. Original copy to be picked up

## 2019-02-26 ENCOUNTER — Encounter (HOSPITAL_COMMUNITY): Payer: BLUE CROSS/BLUE SHIELD | Admitting: Internal Medicine

## 2019-03-07 DEATH — deceased

## 2019-03-27 ENCOUNTER — Other Ambulatory Visit (HOSPITAL_COMMUNITY): Payer: Self-pay | Admitting: Adult Health

## 2019-10-13 IMAGING — DX DG CHEST 1V PORT
1 series · 1 of 1 positions shown · non-contrast
Comparison: 05/01/2015

CLINICAL DATA: Shortness of breath

EXAM:
PORTABLE CHEST 1 VIEW

[chest]
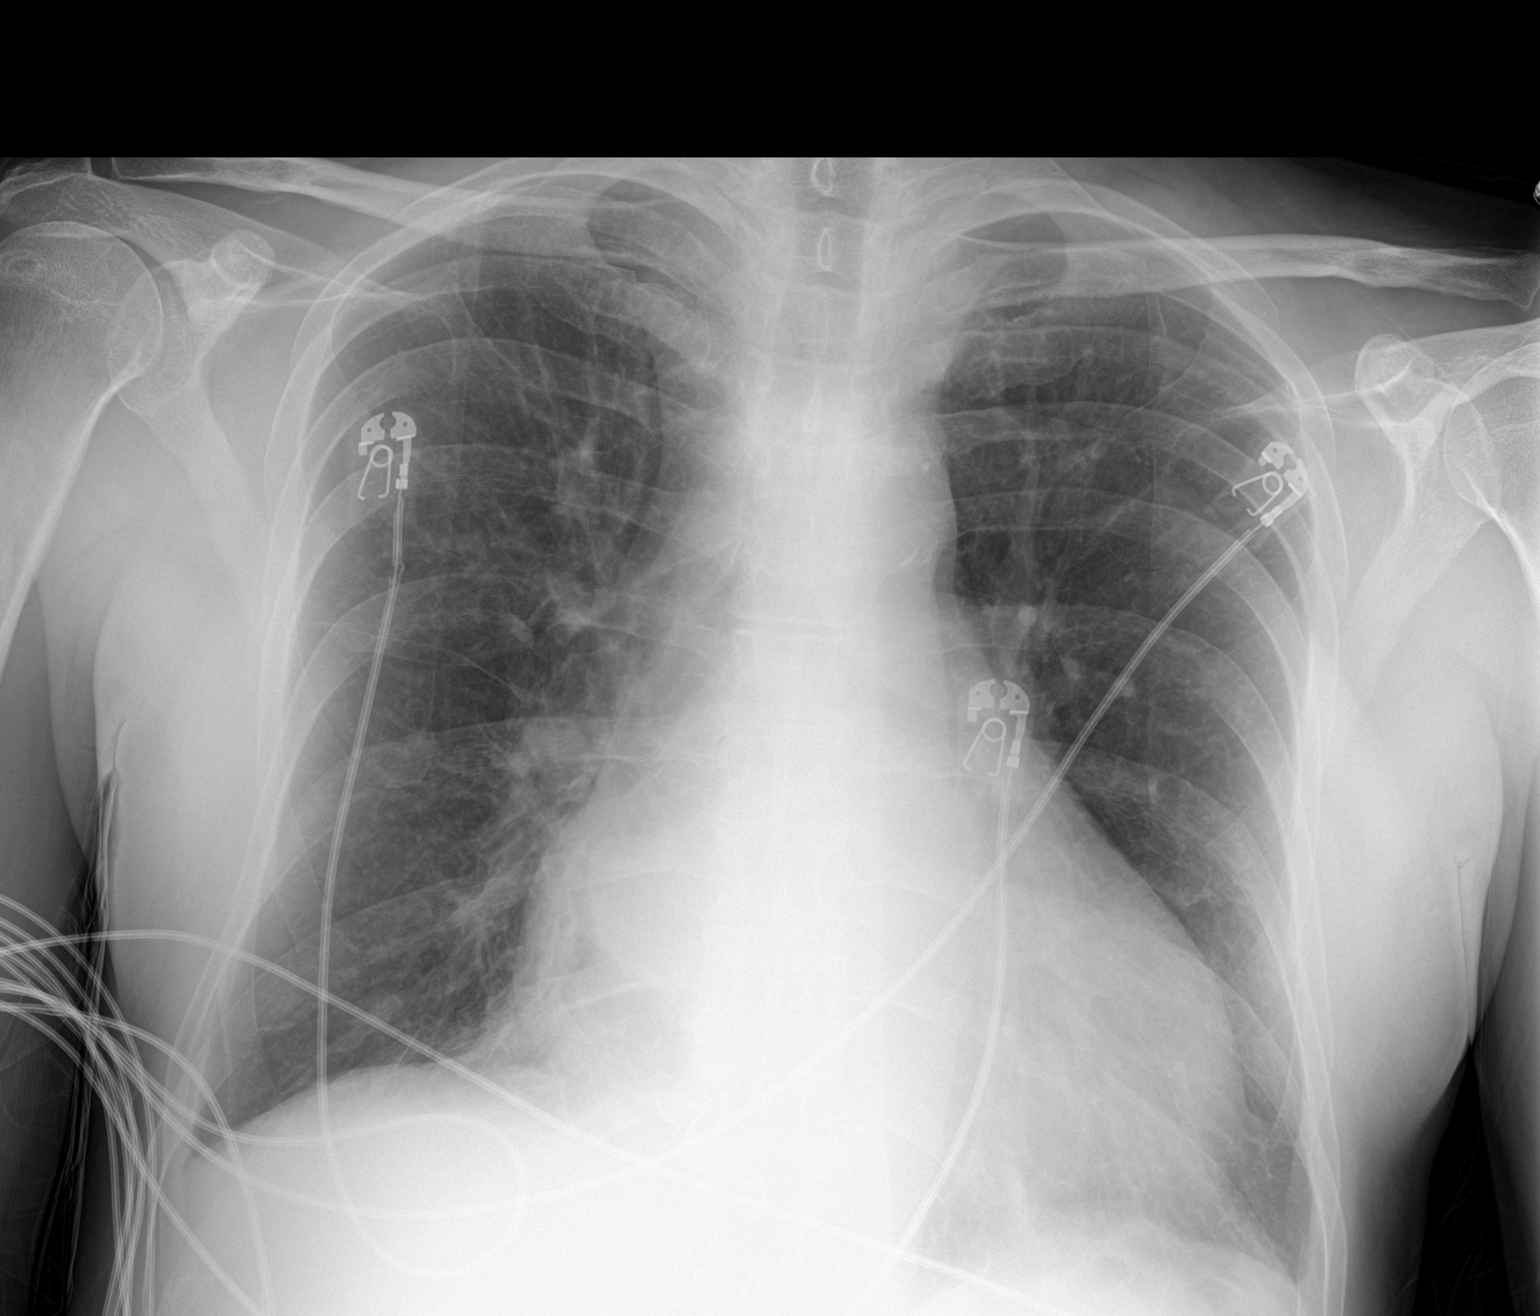

[1 of 1 positions shown; findings below may reference images not displayed]

FINDINGS: Cardiac shadow remains enlarged in size. The lungs are well aerated
bilaterally. No focal infiltrate, effusion or pneumothorax is seen.
No bony abnormality is noted.
IMPRESSION: No active disease.

## 2019-10-13 IMAGING — DX DG CHEST 1V PORT
1 series · 2 of 2 positions shown · non-contrast
Comparison: 07/18/2018

CLINICAL DATA: Status post central line placement

EXAM:
PORTABLE CHEST 1 VIEW

[Series 1: chest · 0.14mm/px · 2 of 2 slices shown]
[im 1/2]
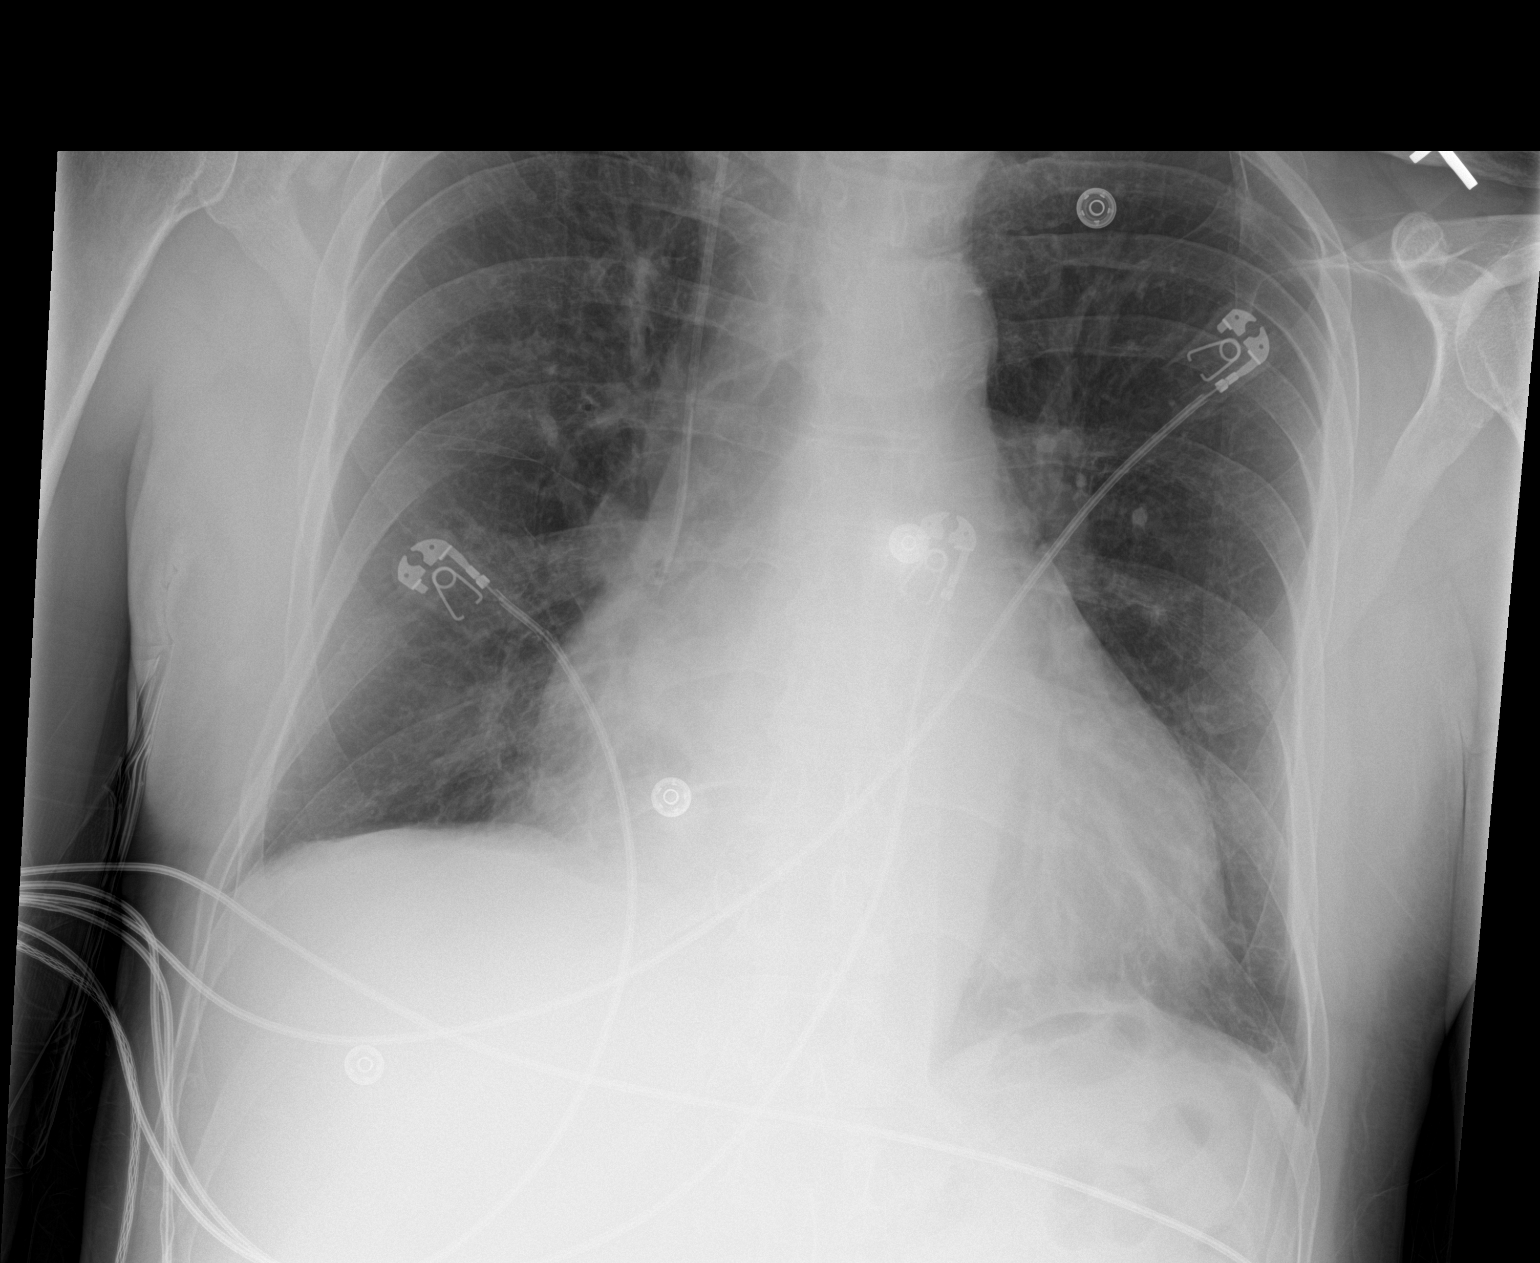
[im 2/2]
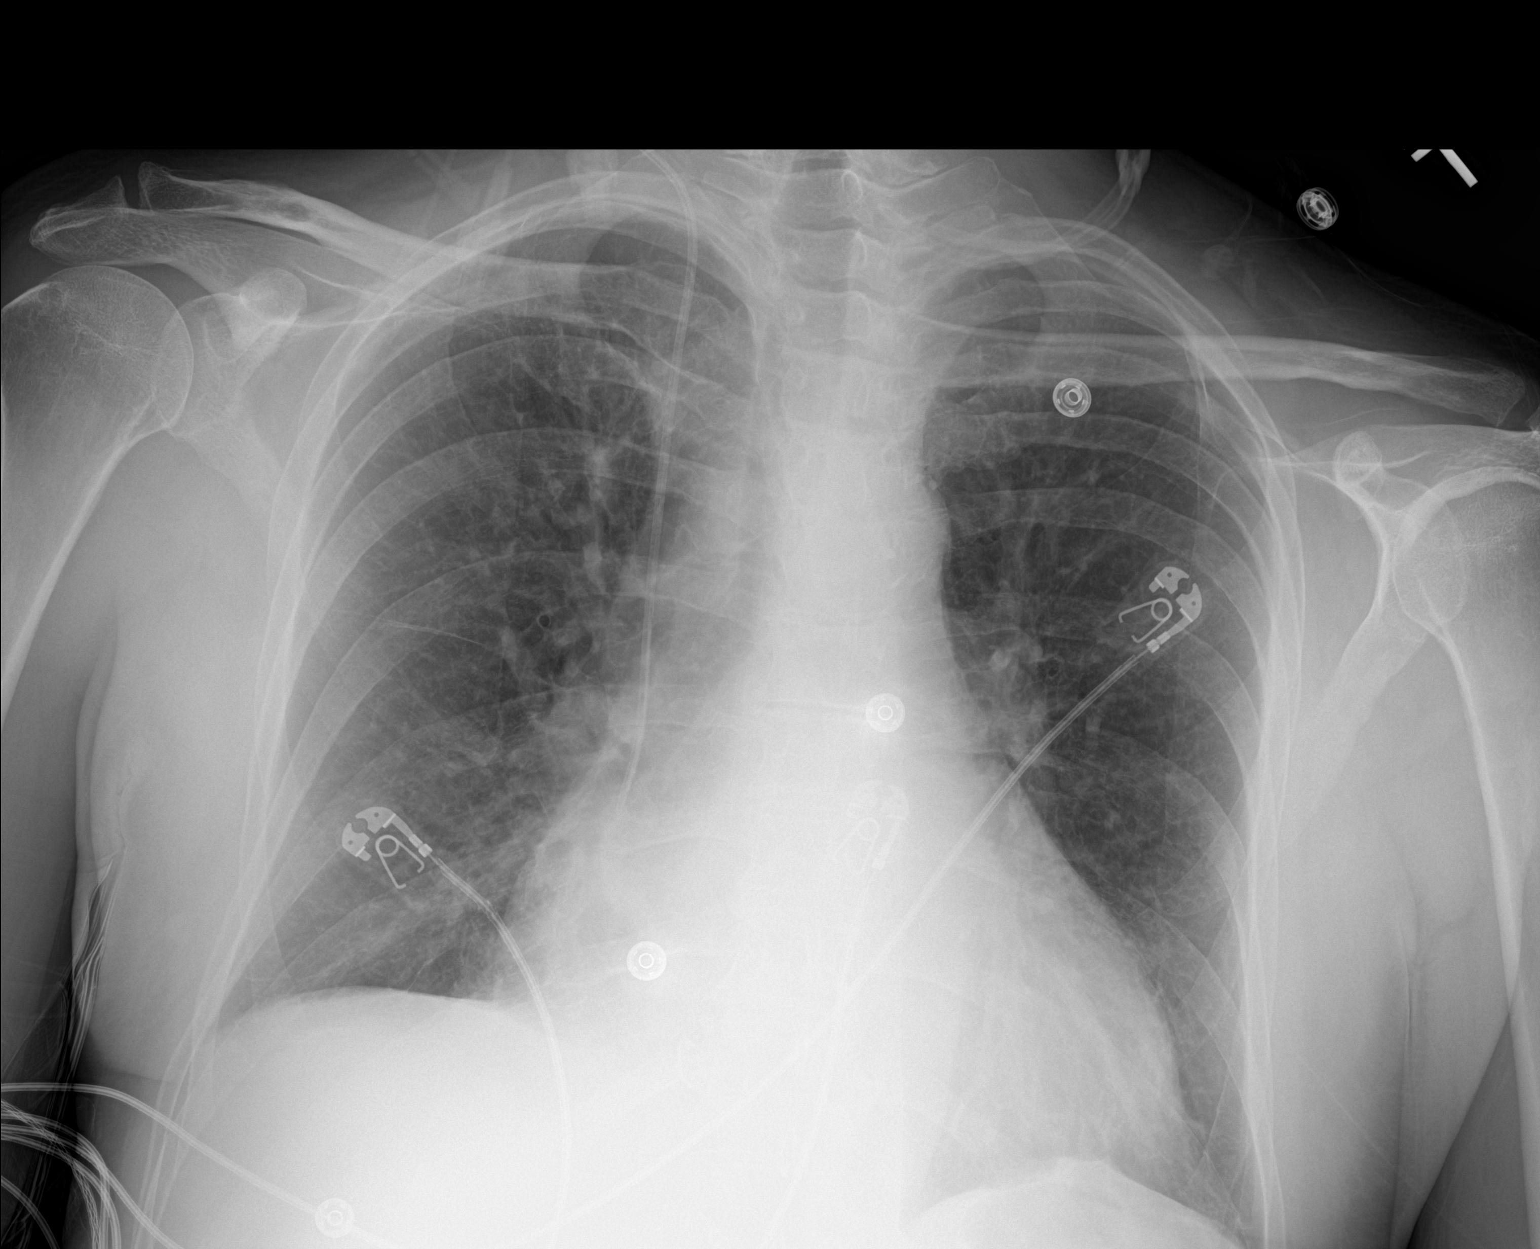

[2 of 2 positions shown; findings below may reference images not displayed]

FINDINGS: Cardiac shadow remains enlarged. Right jugular central line is noted
with the tip at the cavoatrial junction. No pneumothorax is seen. No
focal infiltrate or sizable effusion is noted. No bony abnormality
is seen.
IMPRESSION: No pneumothorax following central line placement.
